# Patient Record
Sex: Male | Born: 1955 | Race: Black or African American | Hispanic: No | Marital: Married | Smoking: Former smoker
Health system: Southern US, Community
[De-identification: ages and names within clinical notes are randomized; demographics above are authoritative.]

## PROBLEM LIST (undated history)

## (undated) DIAGNOSIS — I10 Essential (primary) hypertension: Secondary | ICD-10-CM

## (undated) DIAGNOSIS — I739 Peripheral vascular disease, unspecified: Secondary | ICD-10-CM

## (undated) DIAGNOSIS — I779 Disorder of arteries and arterioles, unspecified: Secondary | ICD-10-CM

## (undated) DIAGNOSIS — E1165 Type 2 diabetes mellitus with hyperglycemia: Secondary | ICD-10-CM

## (undated) DIAGNOSIS — E118 Type 2 diabetes mellitus with unspecified complications: Secondary | ICD-10-CM

## (undated) DIAGNOSIS — I2510 Atherosclerotic heart disease of native coronary artery without angina pectoris: Secondary | ICD-10-CM

## (undated) DIAGNOSIS — I219 Acute myocardial infarction, unspecified: Secondary | ICD-10-CM

## (undated) DIAGNOSIS — I639 Cerebral infarction, unspecified: Secondary | ICD-10-CM

## (undated) DIAGNOSIS — IMO0002 Reserved for concepts with insufficient information to code with codable children: Secondary | ICD-10-CM

## (undated) DIAGNOSIS — E785 Hyperlipidemia, unspecified: Secondary | ICD-10-CM

## (undated) DIAGNOSIS — R001 Bradycardia, unspecified: Secondary | ICD-10-CM

## (undated) HISTORY — PX: SHOULDER SURGERY: SHX246

## (undated) HISTORY — DX: Atherosclerotic heart disease of native coronary artery without angina pectoris: I25.10

## (undated) HISTORY — PX: CHOLECYSTECTOMY: SHX55

## (undated) HISTORY — PX: OTHER SURGICAL HISTORY: SHX169

---

## 2000-06-25 DIAGNOSIS — I219 Acute myocardial infarction, unspecified: Secondary | ICD-10-CM

## 2000-06-25 HISTORY — PX: CORONARY ARTERY BYPASS GRAFT: SHX141

## 2000-06-25 HISTORY — DX: Acute myocardial infarction, unspecified: I21.9

## 2004-01-06 ENCOUNTER — Other Ambulatory Visit: Payer: Self-pay

## 2005-08-19 ENCOUNTER — Inpatient Hospital Stay: Payer: Self-pay | Admitting: Internal Medicine

## 2005-08-19 ENCOUNTER — Other Ambulatory Visit: Payer: Self-pay

## 2005-08-22 ENCOUNTER — Ambulatory Visit: Payer: Self-pay | Admitting: Physical Medicine & Rehabilitation

## 2005-08-22 ENCOUNTER — Inpatient Hospital Stay (HOSPITAL_COMMUNITY)
Admission: RE | Admit: 2005-08-22 | Discharge: 2005-08-29 | Payer: Self-pay | Admitting: Physical Medicine & Rehabilitation

## 2005-09-30 ENCOUNTER — Emergency Department: Payer: Self-pay | Admitting: Emergency Medicine

## 2005-10-05 ENCOUNTER — Encounter
Admission: RE | Admit: 2005-10-05 | Discharge: 2006-01-03 | Payer: Self-pay | Admitting: Physical Medicine & Rehabilitation

## 2005-10-27 ENCOUNTER — Encounter
Admission: RE | Admit: 2005-10-27 | Discharge: 2006-01-25 | Payer: Self-pay | Admitting: Physical Medicine & Rehabilitation

## 2005-11-01 ENCOUNTER — Ambulatory Visit: Payer: Self-pay | Admitting: Physical Medicine & Rehabilitation

## 2007-08-01 ENCOUNTER — Ambulatory Visit: Payer: Self-pay | Admitting: Gastroenterology

## 2007-12-16 ENCOUNTER — Ambulatory Visit: Payer: Self-pay | Admitting: Family Medicine

## 2007-12-31 ENCOUNTER — Emergency Department: Payer: Self-pay | Admitting: Emergency Medicine

## 2009-03-29 ENCOUNTER — Encounter: Payer: Self-pay | Admitting: Internal Medicine

## 2009-04-25 ENCOUNTER — Encounter: Payer: Self-pay | Admitting: Internal Medicine

## 2009-05-25 ENCOUNTER — Encounter: Payer: Self-pay | Admitting: Internal Medicine

## 2010-04-10 ENCOUNTER — Inpatient Hospital Stay: Payer: Self-pay | Admitting: Internal Medicine

## 2010-10-03 ENCOUNTER — Observation Stay (HOSPITAL_COMMUNITY)
Admission: EM | Admit: 2010-10-03 | Discharge: 2010-10-05 | DRG: 282 | Disposition: A | Discharge status: Home / Self Care | Payer: Medicare Other | Attending: Internal Medicine | Admitting: Internal Medicine

## 2010-10-03 ENCOUNTER — Emergency Department (HOSPITAL_COMMUNITY): Payer: Medicare Other

## 2010-10-03 DIAGNOSIS — E119 Type 2 diabetes mellitus without complications: Secondary | ICD-10-CM | POA: Diagnosis present

## 2010-10-03 DIAGNOSIS — R0989 Other specified symptoms and signs involving the circulatory and respiratory systems: Secondary | ICD-10-CM

## 2010-10-03 DIAGNOSIS — I214 Non-ST elevation (NSTEMI) myocardial infarction: Principal | ICD-10-CM | POA: Diagnosis present

## 2010-10-03 DIAGNOSIS — R42 Dizziness and giddiness: Secondary | ICD-10-CM

## 2010-10-03 DIAGNOSIS — R0609 Other forms of dyspnea: Secondary | ICD-10-CM

## 2010-10-03 DIAGNOSIS — Z87891 Personal history of nicotine dependence: Secondary | ICD-10-CM

## 2010-10-03 DIAGNOSIS — I252 Old myocardial infarction: Secondary | ICD-10-CM

## 2010-10-03 DIAGNOSIS — Z8249 Family history of ischemic heart disease and other diseases of the circulatory system: Secondary | ICD-10-CM

## 2010-10-03 DIAGNOSIS — Z79899 Other long term (current) drug therapy: Secondary | ICD-10-CM

## 2010-10-03 DIAGNOSIS — Z7902 Long term (current) use of antithrombotics/antiplatelets: Secondary | ICD-10-CM

## 2010-10-03 DIAGNOSIS — I10 Essential (primary) hypertension: Secondary | ICD-10-CM | POA: Diagnosis present

## 2010-10-03 DIAGNOSIS — Z8673 Personal history of transient ischemic attack (TIA), and cerebral infarction without residual deficits: Secondary | ICD-10-CM

## 2010-10-03 DIAGNOSIS — I2510 Atherosclerotic heart disease of native coronary artery without angina pectoris: Secondary | ICD-10-CM | POA: Diagnosis present

## 2010-10-03 DIAGNOSIS — E785 Hyperlipidemia, unspecified: Secondary | ICD-10-CM | POA: Diagnosis present

## 2010-10-03 DIAGNOSIS — Z794 Long term (current) use of insulin: Secondary | ICD-10-CM

## 2010-10-03 DIAGNOSIS — Z7982 Long term (current) use of aspirin: Secondary | ICD-10-CM

## 2010-10-03 DIAGNOSIS — Z951 Presence of aortocoronary bypass graft: Secondary | ICD-10-CM

## 2010-10-03 LAB — BASIC METABOLIC PANEL
BUN: 17 mg/dL (ref 6–23)
CO2: 27 mEq/L (ref 19–32)
Chloride: 102 mEq/L (ref 96–112)
Creatinine, Ser: 1.09 mg/dL (ref 0.4–1.5)
Glucose, Bld: 143 mg/dL — ABNORMAL HIGH (ref 70–99)
Potassium: 4 mEq/L (ref 3.5–5.1)

## 2010-10-03 LAB — URINALYSIS, ROUTINE W REFLEX MICROSCOPIC
Bilirubin Urine: NEGATIVE
Hgb urine dipstick: NEGATIVE
Ketones, ur: NEGATIVE mg/dL
Nitrite: NEGATIVE
Specific Gravity, Urine: 1.016 (ref 1.005–1.030)
Urobilinogen, UA: 1 mg/dL (ref 0.0–1.0)

## 2010-10-03 LAB — COMPREHENSIVE METABOLIC PANEL
BUN: 16 mg/dL (ref 6–23)
CO2: 28 mEq/L (ref 19–32)
Calcium: 9.3 mg/dL (ref 8.4–10.5)
Chloride: 104 mEq/L (ref 96–112)
Creatinine, Ser: 1.03 mg/dL (ref 0.4–1.5)
GFR calc non Af Amer: >60 mL/min (ref >60)
Glucose, Bld: 121 mg/dL — ABNORMAL HIGH (ref 70–99)
Total Bilirubin: 0.7 mg/dL (ref 0.3–1.2)

## 2010-10-03 LAB — HEPATIC FUNCTION PANEL
ALT: 16 U/L (ref 0–53)
AST: 19 U/L (ref 0–37)
Albumin: 3.8 g/dL (ref 3.5–5.2)
Alkaline Phosphatase: 57 U/L (ref 39–117)
Total Protein: 7.5 g/dL (ref 6.0–8.3)

## 2010-10-03 LAB — CBC
MCH: 28.2 pg (ref 26.0–34.0)
Platelets: 238 10*3/uL (ref 150–400)
RBC: 4.79 MIL/uL (ref 4.22–5.81)
RDW: 13.1 % (ref 11.5–15.5)
WBC: 4.5 10*3/uL (ref 4.0–10.5)

## 2010-10-03 LAB — DIFFERENTIAL
Basophils Relative: 1 % (ref 0–1)
Eosinophils Absolute: 0.2 10*3/uL (ref 0.0–0.7)
Eosinophils Relative: 4 % (ref 0–5)
Monocytes Relative: 7 % (ref 3–12)
Neutrophils Relative %: 46 % (ref 43–77)

## 2010-10-03 LAB — POCT I-STAT, CHEM 8
Calcium, Ion: 1.18 mmol/L (ref 1.12–1.32)
Glucose, Bld: 146 mg/dL — ABNORMAL HIGH (ref 70–99)
HCT: 43 % (ref 39.0–52.0)
TCO2: 28 mmol/L (ref 0–100)

## 2010-10-03 LAB — HEMOGLOBIN A1C
Hgb A1c MFr Bld: 7.9 % — ABNORMAL HIGH (ref <5.7)
Mean Plasma Glucose: 180 mg/dL — ABNORMAL HIGH (ref <117)

## 2010-10-03 LAB — CK TOTAL AND CKMB (NOT AT ARMC)
Relative Index: 18.2 — ABNORMAL HIGH (ref 0.0–2.5)
Total CK: 102 U/L (ref 7–232)

## 2010-10-03 LAB — PROTIME-INR: Prothrombin Time: 12.6 seconds (ref 11.6–15.2)

## 2010-10-03 LAB — POCT CARDIAC MARKERS

## 2010-10-03 LAB — TROPONIN I: Troponin I: 0.49 ng/mL — ABNORMAL HIGH (ref 0.00–0.06)

## 2010-10-04 DIAGNOSIS — I059 Rheumatic mitral valve disease, unspecified: Secondary | ICD-10-CM

## 2010-10-04 DIAGNOSIS — I2510 Atherosclerotic heart disease of native coronary artery without angina pectoris: Secondary | ICD-10-CM

## 2010-10-04 DIAGNOSIS — R55 Syncope and collapse: Secondary | ICD-10-CM

## 2010-10-04 LAB — LIPID PANEL
Cholesterol: 239 mg/dL — ABNORMAL HIGH (ref 0–200)
LDL Cholesterol: 189 mg/dL — ABNORMAL HIGH (ref 0–99)
Total CHOL/HDL Ratio: 6.6 RATIO
Triglycerides: 68 mg/dL (ref <150)
VLDL: 14 mg/dL (ref 0–40)

## 2010-10-04 LAB — CARDIAC PANEL(CRET KIN+CKTOT+MB+TROPI)
CK, MB: 0.9 ng/mL (ref 0.3–4.0)
CK, MB: 0.9 ng/mL (ref 0.3–4.0)
CK, MB: 0.9 ng/mL (ref 0.3–4.0)
Relative Index: INVALID (ref 0.0–2.5)
Total CK: 85 U/L (ref 7–232)
Total CK: 83 U/L (ref 7–232)
Troponin I: <0.01 ng/mL (ref 0.00–0.06)

## 2010-10-04 LAB — BASIC METABOLIC PANEL
BUN: 15 mg/dL (ref 6–23)
Chloride: 106 mEq/L (ref 96–112)
Creatinine, Ser: 0.89 mg/dL (ref 0.4–1.5)
Glucose, Bld: 123 mg/dL — ABNORMAL HIGH (ref 70–99)
Potassium: 3.6 mEq/L (ref 3.5–5.1)

## 2010-10-04 LAB — GLUCOSE, CAPILLARY
Glucose-Capillary: 125 mg/dL — ABNORMAL HIGH (ref 70–99)
Glucose-Capillary: 123 mg/dL — ABNORMAL HIGH (ref 70–99)
Glucose-Capillary: 122 mg/dL — ABNORMAL HIGH (ref 70–99)

## 2010-10-05 LAB — BASIC METABOLIC PANEL
Chloride: 102 mEq/L (ref 96–112)
Creatinine, Ser: 0.99 mg/dL (ref 0.4–1.5)
GFR calc Af Amer: >60 mL/min (ref >60)
Sodium: 137 mEq/L (ref 135–145)

## 2010-10-05 LAB — CBC
Hemoglobin: 13.6 g/dL (ref 13.0–17.0)
MCHC: 33.9 g/dL (ref 30.0–36.0)
RBC: 4.85 MIL/uL (ref 4.22–5.81)
WBC: 5.3 10*3/uL (ref 4.0–10.5)

## 2010-10-05 LAB — GLUCOSE, CAPILLARY
Glucose-Capillary: 126 mg/dL — ABNORMAL HIGH (ref 70–99)
Glucose-Capillary: 104 mg/dL — ABNORMAL HIGH (ref 70–99)

## 2010-10-09 NOTE — Discharge Summary (Signed)
NAME:  Shawn Hood, Shawn Hood                 ACCOUNT NO.:  [id number]  MEDICAL RECORD NO.:  [id number]           PATIENT TYPE:  O  LOCATION:  3712                         FACILITY:  [facility]  PHYSICIAN:  Shawn Hood, M.D.DATE OF BIRTH:  [birth year]  DATE OF ADMISSION:  10/03/2010 DATE OF DISCHARGE:  10/05/2010                              DISCHARGE SUMMARY   DISCHARGE DIAGNOSES: 1. Non-ST elevation myocardial infarction - left heart cath      indicating no specific culprit for non-ST elevation myocardial     infarction, however, severe two-vessel coronary artery disease with     patent grafts.  Chronically occluded OM2/diagonals via by     collaterals. 2. Diabetes mellitus type 2 - hemoglobin A1c 7.4.  Reported episodes     of hypoglycemia at home, without notable hypoglycemia during     hospital course. 3. Hypertension. 4. Hyperlipidemia - newly started on statin therapy. 5. History of cerebrovascular accident x2 - with MRI brain indicating     no acute infarction. 6. History of coronary artery disease status post three-vessel CABG in     2002 performed at [facility] - cardiac cath results as above.  The     patient resumed on aspirin and Plavix therapy.  Started on statin. 7. Left arm paresthesia - resolved, likely in setting of #1.  DISCHARGE MEDICATIONS: 1. Lantus 20 units subcutaneously daily at bedtime. 2. Metoprolol 12.5 mg by mouth twice daily. 3. Crestor 5 mg tablet daily at bedtime. 4. NovoLog insulin inject 2 units subcutaneously three times daily, 15 minutes before each meal. 5. Quinapril 10 mg tablets by mouth daily. 6. Actonel 30 mg tablets by mouth daily. 7. Aspirin 325 mg tablets by mouth daily. 8. Metformin 500 mg 2 tablets by mouth twice daily - will be held for 1 additional day, then resumed (in setting of recent contrast study) 9. Plavix 75 mg tablets by mouth daily.  DISPOSITION AND FOLLOWUP:   The patient is to follow up with his primary care provider, Dr. Fulton Mole as per [facility] on October 09, 2010, at 1:20 p.m. at which time a CBG should be ordered to assess the patient's blood sugar control, as insulin therapy was  de-escalated during hospital course and oral hypoglycemics were also escalated secondary to multiple reported episodes of hypoglycemia at home.  On the de-escalated regimen, the patient's blood sugar was very well controlled during the hospital course generally in the 120s.  We request Dr. Lawerance Bach also evaluate the patient's blood pressure control as he was newly started on beta-blocker and then will be discharged home on a reduced dose of his ACE inhibitor to hopefully avoid episodes of hypotension after discharge.  Liver function tests should be evaluated in 4 to 6 weeks as the patient has been newly started on statin therapy during the hospital course.  The patient should also be reevaluated for any further indications of neurologic deficit versus cardiac chest pain.   The patient is also to follow up with [facility] or other preferred cardiologist within 1 month time for followup of his coronary artery disease and  management of his significant risk factors.  The patient is recommended to follow up carbohydrate-modified diabetic diet and maintain a low sodium heart-healthy diet.    The patient is instructed that since he is status post catheterization, he is not to for take any heavy lifting for 1 week and then he can escalate his activity as tolerated. He is also to proceed with post-cath cleaning and care instructions as provided by nursing, prior to discharge.  PROCEDURES PERFORMED: 1. Chest x-ray - October 03, 2010 - no acute findings. 2. MRA, head without contrast - October 04, 2010 - mild intracranial     atherosclerotic disease without large vessel occlusion. 3. MRI brain without contrast - October 04, 2010 - mild chronic infarcts     in the pons.  No acute infarct. 4. 2-D echocardiogram - October 04, 2010 - mild left  ventricular     hypertrophy indicated by mildly increased left ventricular wall     thickness.  Normal systolic function with ejection fraction of 55     to 60%.  Mild mitral valve regurgitation. 5. Carotid Doppler - October 04, 2010 - no significant extracranial     carotid artery stenosis demonstrated.  Mild heterogeneous plaque     noted throughout bilaterally.  Vertebrals are patent with antegrade     flow.  CONSULTATION:  [facility].  BRIEF ADMITTING HISTORY AND PHYSICAL:  The patient is a 55 year old male with past medical history of multiple prior CVAs with the last one in 2007, diabetes mellitus type 2, hypertension, coronary artery disease status post CABG in 2002 at [facility], who presents to [facility] for evaluation of single episode of lightheadedness with associated left arm tingling on the morning of admission.  The patient notes that he was sitting on his couch holding his granddaughter and suddenly became lightheaded, after which time he went to place granddaughter down and noted left upper arm numbness, but was difficult to place her down safely.  No associated chest pain, shortness of breath, diaphoresis, tachycardia, or palpitations.  Symptoms lasted for approximately 1 hour, after which time they spontaneously resolved.  Of note, the patient does have residual mild upper extremity and lower extremity weakness following his last CVA in 2007.  He has not had recent changes in medications, states he is taking his aspirin and Plavix 4 to 5 times 7 days in each week.  Confirmed decreased oral intake during the day of admission.  Did not had blood sugars during acute episode, however, he does confirm low blood sugar of 52 on the evening prior to admission. Otherwise, the patient denied any associated slurred speech, vision changes, headaches, fevers, chills, nausea, vomiting, diarrhea, or abdominal pain.  No recent illnesses or sick contact.  No recent increased  periods of inactivity.  PHYSICAL EXAM:  On admission, VITAL SIGNS:  admission, temperature 97.9, blood pressure 140/80, heart rate 70, respirations 21, O2 saturation 100% on room air. GENERAL:  Well-developed and well-nourished, in no acute distress, alert, appropriate, and cooperative throughout exam. HEAD:  Normocephalic, atraumatic. NECK:  No deformities, masses, or tenderness noted. LUNGS:  Clear to auscultation bilaterally. HEART:  Regular rate and rhythm without murmurs, rubs, or gallops. ABDOMEN:  Bowel sounds normoactive, soft, nondistended, nontender. EXTREMITIES:  No pretibial edema. NEUROLOGIC:  Alert and oriented x3, cranial nerves II through XII grossly intact without focalization, 4/5 muscle strength in left upper and lower extremities as compared to right.  Decreased tactile sensation though in upper left arm as compared to  the right.  No dysmetria appreciated.  LABORATORY DATA:  Labs on admission, CBC:  WBC 4.5, hemoglobin 13.5, hematocrit 40, platelets count 238. BMET:  Sodium 135, potassium 4.0, chloride 102, bicarb 27, glucose 143, BUN 17, creatinine 1.09. Hepatic function panel:  Within normal limits. Cardiac enzymes:  CK-MB 1, troponin less than 0.05, myoglobin 69.6. Coagulation studies:  Within normal limits.  HOSPITAL COURSE BY PROBLEM: 1. Non-ST-elevation MI - on admission, the patient's initial cardiac     enzymes were negative x1.  However, second set of cardiac enzymes     did show a bump in troponin from less than 0.05 to 0.4.     Additionally, there was anterolateral T-wave inversion appreciated     on EKG, although it was unclear if this was new changes.  No prior     EKGs were available from which to compare.  In the setting of T-     wave inversion in addition to increase in troponins, Cardiology was     consulted who had initiated heparin drip and also performed cardiac     cath on October 04, 2010 showing severe two-vessel coronary artery      disease with patent grafts.  Chronically occluded OM2 - diagonal     via by collaterals.  No culprit for NSTEMI identified.  Cardiology     recommended to continue aggressive medical therapy.  During this     cardiac evaluation, fasting lipid panel was also checked, which     revealed suboptimal control of cholesterol, with LDL of 189.     Therefore, after review of patient's outpatient clinic records,     which did not indicate prior intolerances or allergies to statin     therapy, Crestor was initiated during hospital course and the     patient will continue with statin and aspirin therapy at the time     of discharge.  We also worked towards aggressive medical therapy and     risk factor modification.  The patient is recommended follow up     with Cardiology within 1 month of discharge from the hospital. 2. Lightheadedness and left arm tingling - likely in setting of non-ST-     elevation MI.  MRI and MRA were negative for acute infarction. 3. Diabetes mellitus type 2 - hemoglobin A1c of 7.9.  The patient was     de-escalated of his insulin during the hospital course secondary to     reported multiple episodes of hypoglycemia at home.  The patient     was resumed one half of his Lantus dosage and initially held of his     metformin in the setting of multiple required contrasted imaging     studies.  At the time of his discharge, the patient will be held of     his home sulfonylurea and continued on his decreased insulin     regimen with very close followup with his primary care physician to     determine if further escalation as indicated. He will also Metformin for 1 additional     day, then resume, in setting of recent contrast study. 4. Hypertension - the patient had good blood pressure control during     the hospital course with the range between 120 to 130.  He was     started on beta-blocker therapy in the setting of his known     coronary artery disease and diabetes.  He will be  continued on this  medication on discharge and decreased of his home Accupril dosage     and will be counseled on symptomatology indicative of hypotension     such that on hospital followup he can report any hypotensive     episodes if experienced on this dual therapy. 5. History of multiple CVAs - the patient continued on his home     aspirin and Plavix during the hospital course.  MRI had indicated     no new infarction appreciated.  The patient was started on statin     therapy in the setting of non-ST-elevation MI, which he will     continue at the time of discharge.  The patient will require     monitoring of his liver function tests in the setting of recently     started statin therapy. 6. History of coronary artery disease status post CABG in 2002 at [facility]     - please see management as per non-ST-elevation MI.  DISCHARGE LABS:  Sodium 137, potassium 4.1, chloride 102, bicarb 30, glucose 124, BUN 14, creatinine 0.99, calcium 9.5. CBC:  WBC 5.3, hemoglobin 13.6, hematocrit 40.1, platelets 246.  VITAL SIGNS:  On discharge, temperature 98.1, pulse 59, respirations 22, blood pressure 121/72, O2 saturation 99% on room air.    ______________________________ Shawn Abraham, DO   ______________________________ Shawn Hood, M.D.    SK/MEDQ  D:  10/05/2010  T:  10/05/2010  Job:  [id number]  cc:   Dr.  Fulton Mole [facility], Dr. Patty Sermons  Electronically Signed by Shawn Abraham DO on 10/07/2010 10:10:51 AM Electronically Signed by Eliezer Lofts M.D. on 10/09/2010 08:39:41 AM

## 2010-10-10 NOTE — Consult Note (Signed)
NAME:  Shawn Hood, Shawn Hood NO.:  [id number]  MEDICAL RECORD NO.:  [id number]           PATIENT TYPE:  E  LOCATION:  [facility]                         FACILITY:  [facility]  PHYSICIAN:  Cassell Clement, M.D. DATE OF BIRTH:  12-02-1955  DATE OF CONSULTATION:  10/03/2010 DATE OF DISCHARGE:                                CONSULTATION   PRIMARY CARDIOLOGIST:  Dr. Gwen Pounds in [place].  PRIMARY CARE PROVIDER:  [facility] in [place].  PATIENT PROFILE:  A 55 year old male with prior history of coronary artery disease as well as CABG in 2002 and stroke x2, presents with lightheadedness and dyspnea, found to have elevated CK-MB and troponin.  PROBLEMS: 1. Non-ST elevation MI/CAD.     a.     Status post CABG in 2002, performed at [facility].  Negative      Myoview at [facility], October 2011. 2. History of CVA.     a.     1994.     b.     Right pons infarct in 2007. 3. History of TIAs. 4. Hypertension. 5. Hyperlipidemia. 6. Poorly controlled diabetes mellitus. 7. Remote tobacco abuse, quitting about 25 years ago. 8. Status post left shoulder surgery. 9. Status post cholecystectomy.  ALLERGIES:  No known drug allergies.  HISTORY OF PRESENT ILLNESS:  A 55 year old male with prior history of cerebrovascular and cardiovascular disease, status post stroke in 1994. The patient subsequently underwent coronary artery bypass grafting in 2002 at [facility] and has been followed by Dr. Gwen Pounds.  Recurrent CVA in 2007.  Since 2007, he has been doing reasonably well, although, he does have mild upper and lower extremity weakness related to his prior strokes.  He is active at home and has not had any issues from a cardiac standpoint.  He has had no recent chest pain and last had a negative Myoview in October 2011.  This past Monday, he was able to push mow his entire yard without any problems.  Earlier today, the patient was at home, sitting on his couch and holding his  granddaughter who is 41-year-old, and he had sudden onset of a numb sensation in the back of his head associated with profound lightheadedness and presyncope.  The patient had no chest pain, but did note dyspnea.  The patient got up so that he would be able to put down his granddaughter because he was fearful that he may drop her and he felt more lightheaded when standing.  He safely placed his granddaughter down and then sat back down himself and continued to feel lightheaded. He called his wife and together they called 911.  The patient was taken to the [facility] ED where ECG shows anterolateral T-wave inversion.  No old EKGs available for comparison.  His first set of point-of-care markers at 1524 were negative, but followup laboratory set showed an MB of 18.6 with troponin-I of 0.49 and a relative index of 18.2.  The patient is being admitted by the teaching service and up to this point has been worked up for possible TIA/stroke.Marland Kitchen  He has undergone MRI/MRA of the brain and those results are  currently pending.  At this point, he has no complaints, and no focal deficits.  We have been asked to evaluate secondary to acute coronary syndrome.  HOME MEDICATIONS: 1. Metformin 1000 mg b.i.d. 2. Actos 30 mg daily. 3. Aspirin 325 mg daily. 4. Glipizide 10 mg daily. 5. Lantus 40 units nightly. 6. NovoLog 5 units t.i.d. before meals. 7. Plavix 75 mg daily. 8. Quinapril 20 mg daily.  FAMILY HISTORY:  Mother died at 27 of MI.  Father is alive and well at 84.  He has two sisters, they are alive and well.  Three brothers, two of which have had coronary artery disease and CABG.  One brother died at 42 of an MI.  SOCIAL HISTORY:  The patient lives in [place] with his wife.  He is disabled.  He has four children, two of which continued to live at home. Previous history of tobacco abuse, quitting 25 years ago.  Denies alcohol or drug.  His child remained active around the house  without limitations.  REVIEW OF SYSTEMS:  Positive for dyspnea with presyncope, weakness, history of diabetes.  He is a full code.  Otherwise, all systems reviewed and negative.  PHYSICAL EXAMINATION:  VITAL SIGNS:  Temperature 97.9, heart rate 70, respirations 15, blood pressure 134/69, pulse ox 81% on room air. GENERAL:  Pleasant, African American male, in no acute distress.  Awake and alert x3 with a normal affect. HEENT:  Ears normal.  Nares grossly intact, nonfocal. SKIN:  Warm and dry without lesions or masses. NECK:  Supple without bruits or JVD. LUNGS:  Respirations are regular and unlabored.  Clear to auscultation. CARDIAC:  Regular.  S1 and S2.  No S3, S4, or murmurs. ABDOMEN:  Round, soft, nontender, and nondistended.  Bowel sounds present x4. EXTREMITIES:  Warm and dry.  No clubbing, cyanosis, or edema.  Dorsalis pedis, posterior tibial pulses, and femoral pulses are 2+.  No femoral bruits are noted.  Radial pulses are 2+ and equal bilaterally.  Chest x-ray shows no acute findings.  MRI/MRA of the brain is pending at this time.  Hemoglobin 13.5, hematocrit 40.0, WBC 4.5, platelets 238. Sodium 135, potassium 4.0, chloride 102, CO2 27, BUN 17, creatinine 1.09, glucose 143, total bilirubin 0.7, alkaline phosphatase 57, AST 19, ALT 16, total protein 7.5, albumin 3.8.  CK 102, MB 18.6, troponin I 0.49, index 18.2, INR 0.92.  Urinalysis negative.  ASSESSMENT: 1. Non-ST elevation myocardial infarction.  The patient denies chest     pain, which was prior anginal equivalent, though he did have     dyspnea in the setting of lightheadedness and presyncope.  All     symptoms have now resolved.  Agree with admission and continue to     cycle enzymes.  Provided MRI/MRA is within normal limits.  Would     add heparin.  We will admit the patient to the CCU.  Continue     aspirin, Plavix, and ACE inhibitor.  Add statin and beta-blocker.     Probably cath in the a.m.  Pending MRA and  also MRI result. 2. Cardiovascular disease/history of cerebrovascular accident.     MRI/MRA pending.  Symptoms resolved.  No focal deficits present. 3. Hypertensive, stable.  Follow on ACE and beta-blocker. 4. Hyperlipidemia, agree with statin. 5. Diabetes mellitus, hold metformin in preparation for probable cath     in the a.m.     Nicolasa Ducking, ANP   ______________________________ Cassell Clement, M.D.    CB/MEDQ  D:  10/03/2010  T:  10/04/2010  Job:  [id number]  Electronically Signed by Nicolasa Ducking ANP on 10/09/2010 03:05:00 PM Electronically Signed by Cassell Clement M.D. on 10/10/2010 02:25:41 PM

## 2010-10-25 NOTE — Cardiovascular Report (Signed)
NAME:  Shawn Hood, Shawn Hood                 ACCOUNT NO.:  [id number]  MEDICAL RECORD NO.:  [id number]           PATIENT TYPE:  O  LOCATION:  3712                         FACILITY:  [facility]  PHYSICIAN:  Lorine Bears, MD     DATE OF BIRTH:  Sep 18, 1955  DATE OF PROCEDURE: DATE OF DISCHARGE:                           CARDIAC CATHETERIZATION   PRIMARY CARDIOLOGIST:  Lamar Blinks, MD in [place].  REFERRING CARDIOLOGIST:  Cassell Clement, MD  PROCEDURES PERFORMED: 1. Left heart catheterization. 2. Coronary angiography. 3. Left ventricular angiography. 4. Left internal mammary artery angiography. 5. Saphenous vein graft angiography.  INDICATIONS AND CLINICAL HISTORY:  Shawn Hood is a 55 year old gentleman with known history of coronary artery disease status post coronary artery bypass graft surgery in 2002.  He presented with symptoms of presyncope as well as discomfort in the back of his head.  He was found to have a small non-ST elevation myocardial infarction with mildly elevated cardiac enzymes.  His symptoms were overall atypical.  He did have a brain MRI, which showed no acute process.  Due to his symptoms and mildly elevated cardiac enzymes, cardiac catheterization was recommended.  Risks, benefits, and alternatives were discussed with the patient.  Access is right femoral artery.  STUDY DETAILS:  A standard informed consent was obtained.  The right groin area was prepped in a sterile fashion.  It was anesthetized with 1% lidocaine.  A 5-French sheath was placed in the right femoral artery after an anterior puncture.  Coronary angiography was performed with a JL-4, JR-4, LIMA catheter as well as an angled pigtail catheter.  A long exchange wire was used to engage the left subclavian and exchanged to a LIMA catheter.  The patient tolerated the procedure well with no immediate complications.  STUDY FINDINGS:  Hemodynamic findings:  Central aortic pressure was 127/69 with a  mean pressure of 93 mmHg. Left ventricular pressure was 157/9 with a left ventricular end- diastolic pressure of 16 mmHg.  Left ventricular angiography:  This showed normal LV systolic function with an estimated ejection fraction of 60% with no significant mitral regurgitation.  CORONARY ANGIOGRAPHY:  Left main coronary artery:  The vessel was normal in size and free of significant disease.  It has mild atherosclerosis. Left anterior descending artery:  The vessel was normal in size and moderately calcified in the proximal and mid segment.  It is occluded in the mid segment.  It does give two medium size diagonal branches, which are subtotally occluded, and they appeared to be getting collaterals from the right coronary artery. Left circumflex artery:  The vessel was occluded in the proximal segment. Right coronary artery:  The vessel was normal in size.  There is a 40% discrete lesion in the proximal segment as well as tubular 30% disease in the mid segment.  The right coronary artery is giving collaterals to the proximal left circumflex distribution likely OM-2 and OM-1. SVG to OM-3:  The graft was patent with a mild 20% proximal disease. There is no other obstructive disease.  There does seem to be collaterals supplying the other OM branches.  It seems  that the proximal OM branches were not bypassed. SVG to right PDA:  Graft is patent with 30% proximal disease.  There is no other obstructive disease.  There are extensive collaterals going to what seems to be diagonal distribution. LIMA artery:  The vessel was patent with no significant disease in the graft.  However, in the LAD, there is diffuse 80% disease distally close to the apex.  This is not approachable for PCI due to distal location, and it does appear to be chronic.  STUDY CONCLUSIONS: 1. Severe two-vessel coronary artery disease with patent grafts. 2. Chronically occluded OM-2 and possibly OM-1 as well as diagonal      branches, which seems to be supplied by good collaterals. 3. Distal LAD disease close to the apex, which appears to be chronic     and not approachable for percutaneous angioplasty. 4. No culprit was identified for a small non-ST elevation myocardial     infarction. 5. Normal LV systolic function with only mildly elevated left     ventricular end-diastolic pressure.  RECOMMENDATIONS:  I recommend continuing aggressive medical therapy.  No revascularization is advised.     Lorine Bears, MD     MA/MEDQ  D:  10/04/2010  T:  10/05/2010  Job:  [id number]  cc:   Cassell Clement, M.D. Lamar Blinks, MD  Electronically Signed by Lorine Bears MD on 10/25/2010 02:56:01 PM

## 2011-06-26 HISTORY — PX: CORONARY ANGIOPLASTY WITH STENT PLACEMENT: SHX49

## 2011-10-02 ENCOUNTER — Emergency Department (HOSPITAL_COMMUNITY): Payer: Medicare Other

## 2011-10-02 ENCOUNTER — Observation Stay (HOSPITAL_COMMUNITY)
Admission: EM | Admit: 2011-10-02 | Discharge: 2011-10-04 | Disposition: A | Discharge status: Home / Self Care | Payer: Medicare Other | Attending: Internal Medicine | Admitting: Internal Medicine

## 2011-10-02 ENCOUNTER — Encounter (HOSPITAL_COMMUNITY): Payer: Self-pay | Admitting: Emergency Medicine

## 2011-10-02 DIAGNOSIS — I2510 Atherosclerotic heart disease of native coronary artery without angina pectoris: Secondary | ICD-10-CM | POA: Insufficient documentation

## 2011-10-02 DIAGNOSIS — Z8673 Personal history of transient ischemic attack (TIA), and cerebral infarction without residual deficits: Secondary | ICD-10-CM | POA: Insufficient documentation

## 2011-10-02 DIAGNOSIS — R209 Unspecified disturbances of skin sensation: Secondary | ICD-10-CM | POA: Insufficient documentation

## 2011-10-02 DIAGNOSIS — E119 Type 2 diabetes mellitus without complications: Secondary | ICD-10-CM | POA: Insufficient documentation

## 2011-10-02 DIAGNOSIS — R9431 Abnormal electrocardiogram [ECG] [EKG]: Secondary | ICD-10-CM

## 2011-10-02 DIAGNOSIS — E785 Hyperlipidemia, unspecified: Secondary | ICD-10-CM | POA: Insufficient documentation

## 2011-10-02 DIAGNOSIS — R42 Dizziness and giddiness: Secondary | ICD-10-CM | POA: Insufficient documentation

## 2011-10-02 DIAGNOSIS — Z794 Long term (current) use of insulin: Secondary | ICD-10-CM | POA: Insufficient documentation

## 2011-10-02 DIAGNOSIS — R079 Chest pain, unspecified: Principal | ICD-10-CM | POA: Insufficient documentation

## 2011-10-02 DIAGNOSIS — R0602 Shortness of breath: Secondary | ICD-10-CM | POA: Insufficient documentation

## 2011-10-02 DIAGNOSIS — E871 Hypo-osmolality and hyponatremia: Secondary | ICD-10-CM | POA: Insufficient documentation

## 2011-10-02 DIAGNOSIS — I10 Essential (primary) hypertension: Secondary | ICD-10-CM | POA: Insufficient documentation

## 2011-10-02 HISTORY — DX: Cerebral infarction, unspecified: I63.9

## 2011-10-02 HISTORY — DX: Atherosclerotic heart disease of native coronary artery without angina pectoris: I25.10

## 2011-10-02 HISTORY — DX: Essential (primary) hypertension: I10

## 2011-10-02 HISTORY — DX: Hyperlipidemia, unspecified: E78.5

## 2011-10-02 LAB — CBC
HCT: 40.3 % (ref 39.0–52.0)
Platelets: 244 10*3/uL (ref 150–400)
RDW: 12.3 % (ref 11.5–15.5)
WBC: 5.4 10*3/uL (ref 4.0–10.5)

## 2011-10-02 LAB — DIFFERENTIAL
Basophils Absolute: 0 10*3/uL (ref 0.0–0.1)
Lymphocytes Relative: 50 % — ABNORMAL HIGH (ref 12–46)
Monocytes Absolute: 0.3 10*3/uL (ref 0.1–1.0)
Neutro Abs: 2.3 10*3/uL (ref 1.7–7.7)

## 2011-10-02 LAB — POCT I-STAT TROPONIN I: Troponin i, poc: 0 ng/mL (ref 0.00–0.08)

## 2011-10-02 NOTE — ED Notes (Signed)
PT. REPORTS MID CHEST TIGHTNESS / HEAVINESS WITH LIGHTHEADED AND NUMBNESS AT LEFT ARM  ONSET TODAY. DENIES CHEST PAIN AT TRIAGE.

## 2011-10-03 ENCOUNTER — Encounter (HOSPITAL_COMMUNITY): Payer: Self-pay | Admitting: Internal Medicine

## 2011-10-03 DIAGNOSIS — E119 Type 2 diabetes mellitus without complications: Secondary | ICD-10-CM | POA: Diagnosis present

## 2011-10-03 DIAGNOSIS — I059 Rheumatic mitral valve disease, unspecified: Secondary | ICD-10-CM

## 2011-10-03 DIAGNOSIS — I10 Essential (primary) hypertension: Secondary | ICD-10-CM | POA: Diagnosis present

## 2011-10-03 DIAGNOSIS — R079 Chest pain, unspecified: Principal | ICD-10-CM

## 2011-10-03 DIAGNOSIS — E871 Hypo-osmolality and hyponatremia: Secondary | ICD-10-CM | POA: Diagnosis present

## 2011-10-03 LAB — CARDIAC PANEL(CRET KIN+CKTOT+MB+TROPI)
CK, MB: 1.4 ng/mL (ref 0.3–4.0)
Relative Index: INVALID (ref 0.0–2.5)
Total CK: 74 U/L (ref 7–232)
Total CK: 73 U/L (ref 7–232)
Troponin I: <0.3 ng/mL (ref <0.30)

## 2011-10-03 LAB — GLUCOSE, CAPILLARY
Glucose-Capillary: 268 mg/dL — ABNORMAL HIGH (ref 70–99)
Glucose-Capillary: 328 mg/dL — ABNORMAL HIGH (ref 70–99)
Glucose-Capillary: 110 mg/dL — ABNORMAL HIGH (ref 70–99)
Glucose-Capillary: 167 mg/dL — ABNORMAL HIGH (ref 70–99)

## 2011-10-03 LAB — CBC
MCH: 27.9 pg (ref 26.0–34.0)
Platelets: 224 10*3/uL (ref 150–400)
RBC: 5.19 MIL/uL (ref 4.22–5.81)
RDW: 12.4 % (ref 11.5–15.5)

## 2011-10-03 LAB — BASIC METABOLIC PANEL
CO2: 24 mEq/L (ref 19–32)
Chloride: 95 mEq/L — ABNORMAL LOW (ref 96–112)
Sodium: 132 mEq/L — ABNORMAL LOW (ref 135–145)

## 2011-10-03 LAB — TSH: TSH: 2.33 u[IU]/mL (ref 0.350–4.500)

## 2011-10-03 LAB — CREATININE, SERUM: Creatinine, Ser: 0.86 mg/dL (ref 0.50–1.35)

## 2011-10-03 MED ORDER — ATORVASTATIN CALCIUM 10 MG PO TABS
10.0000 mg | ORAL_TABLET | Freq: Every day | ORAL | Status: DC
  Administered 2011-10-03: 10 mg via ORAL
  Filled 2011-10-03 (×2): qty 1

## 2011-10-03 MED ORDER — INSULIN GLARGINE 100 UNIT/ML ~~LOC~~ SOLN: 40.0000 [IU] | Freq: Two times a day (BID) | SUBCUTANEOUS | Status: DC

## 2011-10-03 MED ORDER — ONDANSETRON HCL 4 MG PO TABS: 4.0000 mg | ORAL_TABLET | Freq: Four times a day (QID) | ORAL | Status: DC | PRN

## 2011-10-03 MED ORDER — INSULIN GLARGINE 100 UNIT/ML ~~LOC~~ SOLN
50.0000 [IU] | Freq: Two times a day (BID) | SUBCUTANEOUS | Status: DC
  Administered 2011-10-03: 11:00:00 via SUBCUTANEOUS
  Administered 2011-10-03 – 2011-10-04 (×2): 50 [IU] via SUBCUTANEOUS

## 2011-10-03 MED ORDER — ONDANSETRON HCL 4 MG/2ML IJ SOLN: 4.0000 mg | Freq: Four times a day (QID) | INTRAMUSCULAR | Status: DC | PRN

## 2011-10-03 MED ORDER — NITROGLYCERIN 0.2 MG/HR TD PT24
0.2000 mg | MEDICATED_PATCH | Freq: Every day | TRANSDERMAL | Status: DC
  Filled 2011-10-03: qty 1

## 2011-10-03 MED ORDER — CLOPIDOGREL BISULFATE 75 MG PO TABS
75.0000 mg | ORAL_TABLET | Freq: Every day | ORAL | Status: DC
  Administered 2011-10-03 – 2011-10-04 (×2): 75 mg via ORAL
  Filled 2011-10-03 (×2): qty 1

## 2011-10-03 MED ORDER — SODIUM CHLORIDE 0.45 % IV SOLN
INTRAVENOUS | Status: DC
  Administered 2011-10-03 – 2011-10-04 (×2): via INTRAVENOUS

## 2011-10-03 MED ORDER — INSULIN ASPART 100 UNIT/ML ~~LOC~~ SOLN
25.0000 [IU] | Freq: Three times a day (TID) | SUBCUTANEOUS | Status: DC
  Administered 2011-10-03: 25 [IU] via SUBCUTANEOUS

## 2011-10-03 MED ORDER — ASPIRIN EC 325 MG PO TBEC
325.0000 mg | DELAYED_RELEASE_TABLET | Freq: Every day | ORAL | Status: DC
  Administered 2011-10-03 – 2011-10-04 (×2): 325 mg via ORAL
  Filled 2011-10-03 (×2): qty 1

## 2011-10-03 MED ORDER — QUINAPRIL HCL 10 MG PO TABS
20.0000 mg | ORAL_TABLET | Freq: Every day | ORAL | Status: DC
  Filled 2011-10-03: qty 2

## 2011-10-03 MED ORDER — ISOSORBIDE MONONITRATE ER 30 MG PO TB24
30.0000 mg | ORAL_TABLET | Freq: Every day | ORAL | Status: DC
  Administered 2011-10-03 – 2011-10-04 (×2): 30 mg via ORAL
  Filled 2011-10-03 (×3): qty 1

## 2011-10-03 MED ORDER — ACETAMINOPHEN 325 MG PO TABS
650.0000 mg | ORAL_TABLET | Freq: Four times a day (QID) | ORAL | Status: DC | PRN
  Administered 2011-10-03: 650 mg via ORAL
  Filled 2011-10-03: qty 2

## 2011-10-03 MED ORDER — INSULIN ASPART 100 UNIT/ML ~~LOC~~ SOLN
0.0000 [IU] | Freq: Three times a day (TID) | SUBCUTANEOUS | Status: DC
  Administered 2011-10-03: 11 [IU] via SUBCUTANEOUS
  Administered 2011-10-04: 3 [IU] via SUBCUTANEOUS

## 2011-10-03 MED ORDER — ASPIRIN 81 MG PO CHEW
324.0000 mg | CHEWABLE_TABLET | Freq: Once | ORAL | Status: AC
  Administered 2011-10-03: 324 mg via ORAL
  Filled 2011-10-03: qty 4

## 2011-10-03 MED ORDER — MORPHINE SULFATE 2 MG/ML IJ SOLN: 1.0000 mg | INTRAMUSCULAR | Status: DC | PRN

## 2011-10-03 MED ORDER — ENOXAPARIN SODIUM 40 MG/0.4ML ~~LOC~~ SOLN
40.0000 mg | SUBCUTANEOUS | Status: DC
  Administered 2011-10-03: 40 mg via SUBCUTANEOUS
  Filled 2011-10-03 (×2): qty 0.4

## 2011-10-03 MED ORDER — ACETAMINOPHEN 650 MG RE SUPP: 650.0000 mg | Freq: Four times a day (QID) | RECTAL | Status: DC | PRN

## 2011-10-03 MED ORDER — LISINOPRIL 20 MG PO TABS
20.0000 mg | ORAL_TABLET | Freq: Every day | ORAL | Status: DC
  Administered 2011-10-03: 20 mg via ORAL
  Filled 2011-10-03 (×2): qty 1

## 2011-10-03 MED ORDER — INSULIN ASPART 100 UNIT/ML ~~LOC~~ SOLN: 0.0000 [IU] | Freq: Every day | SUBCUTANEOUS | Status: DC

## 2011-10-03 NOTE — Progress Notes (Signed)
  Echocardiogram 2D Echocardiogram has been performed.  Shawn Hood [facility] 10/03/2011, 10:19 AM

## 2011-10-03 NOTE — ED Notes (Signed)
Advised receiving RN I would wait for orders to send the patient to the floor.

## 2011-10-03 NOTE — Progress Notes (Signed)
Utilization Review Completed.Elisavet Buehrer T4/03/2012   

## 2011-10-03 NOTE — ED Provider Notes (Signed)
History     CSN: [id number]  Arrival date & time 10/02/11  2259   First MD Initiated Contact with Patient 10/03/11 0211      Chief Complaint  Patient presents with  . Chest Pain    (Consider location/radiation/quality/duration/timing/severity/associated sxs/prior treatment) Patient is a 56 y.o. male presenting with chest pain. The history is provided by the patient. No language interpreter was used.  Chest Pain The chest pain began yesterday. Chest pain occurs frequently. The chest pain is unchanged. Associated with: nothing. The severity of the pain is moderate. The quality of the pain is described as dull. The pain does not radiate. Exacerbated by: nothing. Primary symptoms include shortness of breath.  Pertinent negatives for associated symptoms include no weakness. He tried nothing for the symptoms. Risk factors include being elderly and male gender.  His past medical history is significant for MI.  Procedure history is positive for cardiac catheterization and echocardiogram.   Having numbness in the back of the head, left arm pain and right CP at rest off and on since yesterday.  Similar symptoms to when he had a "mild heart attack" last year.    Past Medical History  Diagnosis Date  . Diabetes mellitus   . Hypertension   . Coronary artery disease     Past Surgical History  Procedure Date  . Coronary artery bypass graft     No family history on file.  History  Substance Use Topics  . Smoking status: Never Smoker   . Smokeless tobacco: Not on file  . Alcohol Use: No      Review of Systems  Constitutional: Negative.   HENT: Negative.   Eyes: Negative.   Respiratory: Positive for shortness of breath.   Cardiovascular: Positive for chest pain.  Gastrointestinal: Negative.   Genitourinary: Negative.   Musculoskeletal: Negative.   Neurological: Negative for weakness.  Hematological: Negative.   Psychiatric/Behavioral: Negative.     Allergies  Review of  patient's allergies indicates no known allergies.  Home Medications   Current Outpatient Rx  Name Route Sig Dispense Refill  . ASPIRIN EC 325 MG PO TBEC Oral Take 325 mg by mouth daily.    Marland Kitchen CLOPIDOGREL BISULFATE 75 MG PO TABS Oral Take 75 mg by mouth daily.    . INSULIN ASPART 100 UNIT/ML [place] SOLN Subcutaneous Inject 25 Units into the skin 3 (three) times daily before meals.    . INSULIN GLARGINE 100 UNIT/ML [place] SOLN Subcutaneous Inject 40 Units into the skin 2 (two) times daily.    Marland Kitchen METFORMIN HCL 500 MG PO TABS Oral Take 1,000 mg by mouth 2 (two) times daily with a meal.    . QUINAPRIL HCL 20 MG PO TABS Oral Take 20 mg by mouth at bedtime.      BP 135/79  Pulse 60  Temp(Src) 98.1 F (36.7 C) (Oral)  Resp 18  Ht 5\' 11"  (1.803 m)  Wt 224 lb (101.606 kg)  BMI 31.24 kg/m2  SpO2 99%  Physical Exam  Constitutional: He is oriented to person, place, and time. He appears well-developed and well-nourished. No distress.  HENT:  Head: Normocephalic and atraumatic.  Mouth/Throat: Oropharynx is clear and moist.  Eyes: Conjunctivae are normal. Pupils are equal, round, and reactive to light.  Neck: Normal range of motion. Neck supple.  Cardiovascular: Normal rate and regular rhythm.   Pulmonary/Chest: Effort normal and breath sounds normal.  Abdominal: Soft. Bowel sounds are normal. There is no tenderness.  Musculoskeletal: Normal range of  motion. He exhibits no edema.  Neurological: He is alert and oriented to person, place, and time.  Skin: Skin is warm and dry.  Psychiatric: He has a normal mood and affect.    ED Course  Procedures (including critical care time)  Labs Reviewed  DIFFERENTIAL - Abnormal; Notable for the following:    Lymphocytes Relative 50 (*)    All other components within normal limits  BASIC METABOLIC PANEL - Abnormal; Notable for the following:    Sodium 132 (*)    Chloride 95 (*)    Glucose, Bld 494 (*)    All other components within normal limits  CBC    POCT I-STAT TROPONIN I   Dg Chest 2 View  10/03/2011  *RADIOLOGY REPORT*  Clinical Data: Chest pain.  CHEST - 2 VIEW  Comparison: 10/03/2010.  Findings: Minimal peribronchial thickening.  Mild central pulmonary vascular prominence.  Post CABG.  Heart size within normal limits. No infiltrate, congestive heart failure or pneumothorax.  IMPRESSION: No acute abnormality.  Original Report Authenticated By: Fuller Canada, M.D.     No diagnosis found.    MDM   Date: 10/03/2011  Rate: 93  Rhythm: normal sinus rhythm  QRS Axis: left  Intervals: normal  ST/T Wave abnormalities: nonspecific ST/T changes  Conduction Disutrbances:none  Narrative Interpretation: poor r wave progression  Old EKG Reviewed: changes noted   Case d/w       Jusitn Salsgiver Smitty Cords, MD 10/03/11 [phone]

## 2011-10-03 NOTE — H&P (Signed)
Shawn Hood is an 56 y.o. male.   Chief Complaint: Chest pain HPI: A 56 year old gentleman with known history of coronary artery disease status post coronary artery bypass grafting who has been doing fine until yesterday when he started feeling left-sided chest pressure and pain. Pain radiated down his left arm and to his left jaw he's had these intermittent lasting about 1-2 minutes rated a 6-7/10. He did not take anything for it prior to coming to the emergency room however the pain responded to nitroglycerin in the emergency room. He denied nausea vomiting or dizziness no diaphoresis. He has had recent follow up with cardiology in the discussion of the exact date. It is not clear if patient had a recent stress test also but he did have a cath prior to prior to last year. His EKG showed mild nonspecific lateral lead changes that is slightly different from his previous EKG 2 years ago. His cardiac enzymes so far negative. Cardiology want medicine to admit the patient and consult them if needed.  Past Medical History  Diagnosis Date  . Diabetes mellitus   . Hypertension   . Coronary artery disease     Past Surgical History  Procedure Date  . Coronary artery bypass graft     History reviewed. No pertinent family history. Social History:  reports that he has never smoked. He does not have any smokeless tobacco history on file. He reports that he does not drink alcohol or use illicit drugs.  Allergies: No Known Allergies  Medications Prior to Admission  Medication Dose Route Frequency Provider Last Rate Last Dose  . aspirin chewable tablet 324 mg  324 mg Oral Once April K Palumbo-Rasch, MD   324 mg at 10/03/11 0227   Medications Prior to Admission  Medication Sig Dispense Refill  . aspirin EC 325 MG tablet Take 325 mg by mouth daily.      . clopidogrel (PLAVIX) 75 MG tablet Take 75 mg by mouth daily.      . insulin aspart (NOVOLOG) 100 UNIT/ML injection Inject 25 Units into the skin 3  (three) times daily before meals.      . insulin glargine (LANTUS) 100 UNIT/ML injection Inject 40 Units into the skin 2 (two) times daily.      . metFORMIN (GLUCOPHAGE) 500 MG tablet Take 1,000 mg by mouth 2 (two) times daily with a meal.      . quinapril (ACCUPRIL) 20 MG tablet Take 20 mg by mouth at bedtime.        Results for orders placed during the hospital encounter of 10/02/11 (from the past 48 hour(s))  CBC     Status: Normal   Collection Time   10/02/11 11:08 PM      Component Value Range Comment   WBC 5.4  4.0 - 10.5 (K/uL)    RBC 4.99  4.22 - 5.81 (MIL/uL)    Hemoglobin 14.3  13.0 - 17.0 (g/dL)    HCT 16.1  09.6 - 04.5 (%)    MCV 80.8  78.0 - 100.0 (fL)    MCH 28.7  26.0 - 34.0 (pg)    MCHC 35.5  30.0 - 36.0 (g/dL)    RDW 40.9  81.1 - 91.4 (%)    Platelets 244  150 - 400 (K/uL)   DIFFERENTIAL     Status: Abnormal   Collection Time   10/02/11 11:08 PM      Component Value Range Comment   Neutrophils Relative 43  43 - 77 (%)  Neutro Abs 2.3  1.7 - 7.7 (K/uL)    Lymphocytes Relative 50 (*) 12 - 46 (%)    Lymphs Abs 2.7  0.7 - 4.0 (K/uL)    Monocytes Relative 5  3 - 12 (%)    Monocytes Absolute 0.3  0.1 - 1.0 (K/uL)    Eosinophils Relative 2  0 - 5 (%)    Eosinophils Absolute 0.1  0.0 - 0.7 (K/uL)    Basophils Relative 1  0 - 1 (%)    Basophils Absolute 0.0  0.0 - 0.1 (K/uL)   BASIC METABOLIC PANEL     Status: Abnormal   Collection Time   10/02/11 11:08 PM      Component Value Range Comment   Sodium 132 (*) 135 - 145 (mEq/L)    Potassium 3.9  3.5 - 5.1 (mEq/L)    Chloride 95 (*) 96 - 112 (mEq/L)    CO2 24  19 - 32 (mEq/L)    Glucose, Bld 494 (*) 70 - 99 (mg/dL)    BUN 14  6 - 23 (mg/dL)    Creatinine, Ser 1.61  0.50 - 1.35 (mg/dL)    Calcium 9.5  8.4 - 10.5 (mg/dL)    GFR calc non Af Amer >90  >90 (mL/min)    GFR calc Af Amer >90  >90 (mL/min)   POCT I-STAT TROPONIN I     Status: Normal   Collection Time   10/02/11 11:36 PM      Component Value Range Comment    Troponin i, poc 0.00  0.00 - 0.08 (ng/mL)    Comment 3            GLUCOSE, CAPILLARY     Status: Abnormal   Collection Time   10/03/11  5:13 AM      Component Value Range Comment   Glucose-Capillary 347 (*) 70 - 99 (mg/dL)    Comment 1 Notify RN      Comment 2 Documented in Chart      Dg Chest 2 View  10/03/2011  *RADIOLOGY REPORT*  Clinical Data: Chest pain.  CHEST - 2 VIEW  Comparison: 10/03/2010.  Findings: Minimal peribronchial thickening.  Mild central pulmonary vascular prominence.  Post CABG.  Heart size within normal limits. No infiltrate, congestive heart failure or pneumothorax.  IMPRESSION: No acute abnormality.  Original Report Authenticated By: Fuller Canada, M.D.    Review of Systems  Constitutional: Negative.  Negative for diaphoresis.  HENT: Negative.   Eyes: Negative.   Respiratory: Positive for shortness of breath. Negative for cough, hemoptysis, sputum production and wheezing.   Cardiovascular: Positive for chest pain and palpitations. Negative for orthopnea, claudication, leg swelling and PND.  Gastrointestinal: Negative for nausea, vomiting, blood in stool and melena.  Genitourinary: Negative.   Musculoskeletal: Negative.   Skin: Negative.   Neurological: Negative.   Endo/Heme/Allergies: Negative.   Psychiatric/Behavioral: Negative.     Blood pressure 139/82, pulse 52, temperature 98.1 F (36.7 C), temperature source Oral, resp. rate 19, height 5\' 11"  (1.803 m), weight 101.606 kg (224 lb), SpO2 100.00%. Physical Exam  Constitutional: He is oriented to person, place, and time. He appears well-developed and well-nourished.  HENT:  Head: Normocephalic and atraumatic.  Right Ear: External ear normal.  Left Ear: External ear normal.  Nose: Nose normal.  Mouth/Throat: Oropharynx is clear and moist.  Eyes: Conjunctivae and EOM are normal. Pupils are equal, round, and reactive to light.  Cardiovascular: Normal rate, regular rhythm, normal heart sounds and intact  distal pulses.   Respiratory: Effort normal and breath sounds normal.  GI: Soft. Bowel sounds are normal.  Musculoskeletal: Normal range of motion.  Neurological: He is alert and oriented to person, place, and time. He has normal reflexes.  Skin: Skin is warm and dry.  Psychiatric: He has a normal mood and affect. His behavior is normal. Judgment and thought content normal.     Assessment/Plan Assessment this is a 56 year old gentleman with known coronary artery disease presenting with what appears to be angina. These have so far been relieved by nitroglycerin. No active chest pain at the moment however patient is also known to have coronary artery disease and could still have acute coronary syndrome. Plan #1 chest pain: We'll admit the patient for MI rule out. He'll be on Lovenox but not therapeutic dose statin aspirin continue with his Plavix and also check serial cardiac enzymes and give him some morphine. Depending on how he responds to these we will consider cardiology consult for further evaluation. Since patient has had a cath not too long ago he probably would benefit mainly from a stress test if possible. #2 diabetes I will hold his metformin and start sliding scale insulin and continue with his home insulin regimen. #3 hypertension his blood pressure is controlled on his home medications which we will continue. In addition he will have nitroglycerin patches. #4 hyponatremia: His sodium level is slightly low probably from some home diuresis. We'll follow his sodium level and have some gentle saline infusion. Marishka Rentfrow,LAWAL 10/03/2011, 7:04 AM

## 2011-10-03 NOTE — ED Notes (Signed)
Called and gave report to [place].

## 2011-10-03 NOTE — ED Notes (Signed)
The patient states he had pain in the back of his head and in his left arm that went away at 1700 yesterday.

## 2011-10-03 NOTE — Progress Notes (Signed)
Subjective: Asymptomatic.  Objective: Vital signs in last 24 hours: Temp:  [97.5 F (36.4 C)-98.1 F (36.7 C)] 97.5 F (36.4 C) (04/10 0833) Pulse Rate:  [52-91] 56  (04/10 0833) Resp:  [16-20] 18  (04/10 0833) BP: (130-161)/(75-87) 133/83 mmHg (04/10 0833) SpO2:  [96 %-100 %] 98 % (04/10 0833) Weight:  [101.606 kg (224 lb)] 101.606 kg (224 lb) (04/10 0833) Weight change:     Intake/Output from previous day:       Physical Exam: General: Comfortable, alert, communicative, fully oriented, not short of breath at rest.  HEENT:  No clinical pallor, no jaundice, no conjunctival injection or discharge. Hydration status is fair. NECK:  Supple, JVP not seen, no carotid bruits, no palpable lymphadenopathy, no palpable goiter. CHEST:  Clinically clear to auscultation, no wheezes, no crackles. HEART:  Sounds 1 and 2 heard, normal, regular, no murmurs. ABDOMEN:  Full, soft, non-tender, no palpable organomegaly, no palpable masses, normal bowel sounds. GENITALIA:  Not examined. LOWER EXTREMITIES:  No pitting edema, palpable peripheral pulses. MUSCULOSKELETAL SYSTEM:  Old surgical scar over left shoulder, otherwise, unremarkable. CENTRAL NERVOUS SYSTEM:  No focal neurologic deficit on gross examination.  Lab Results:  Basename 10/02/11 2308  WBC 5.4  HGB 14.3  HCT 40.3  PLT 244    Basename 10/02/11 2308  NA 132*  K 3.9  CL 95*  CO2 24  GLUCOSE 494*  BUN 14  CREATININE 0.79  CALCIUM 9.5   No results found for this or any previous visit (from the past 240 hour(s)).   Studies/Results: Dg Chest 2 View  10/03/2011  *RADIOLOGY REPORT*  Clinical Data: Chest pain.  CHEST - 2 VIEW  Comparison: 10/03/2010.  Findings: Minimal peribronchial thickening.  Mild central pulmonary vascular prominence.  Post CABG.  Heart size within normal limits. No infiltrate, congestive heart failure or pneumothorax.  IMPRESSION: No acute abnormality.  Original Report Authenticated By: Fuller Canada,  M.D.    Medications: Scheduled Meds:   . aspirin  324 mg Oral Once   Continuous Infusions:  PRN Meds:.  Assessment/Plan:  Principal Problem:  *Chest pain: Patient is now asymptomatic. He has a history of CAD, s/p CABG 2002, s/p negative stress myoview 03/2010, s/p NSEMI 09/2010. Cardiac cath at that time, performed by Dr Kirke Corin, showed severe 2-vessel disease, with patent grafts, chronically occluded OM-2/OM-1/Diagonal and distal LAD disease, not amenable to PTCA. Aggressive medical management was recommended. Clinically, this appears to be stable angina. We are currently cycling cardiac enzymes, and will consult [place] cardiology for recommendations, in view of multiple risk factors of HTN, DM, dyslipidemia, cerebrovascular disease, including remote rt pons CVA in 2007 and previous TIAs. Will add Imdur to current management. Heart rate is borderline low, so unable to utilize beta-blockade. Active Problems:  1. DM (diabetes mellitus): This is uncontrolled. Will increase Lantus to 50 units b.i.d. Meanwhile, diet/SSI. Check HBA1C.  2. HTN (hypertension): Controlled.  3. Dyslipidemia: Commence Statin, check lipid profile and TSH.  Disposition: Per cardiology.    LOS: 1 day   Kelii Chittum,CHRISTOPHER 10/03/2011, 8:50 AM

## 2011-10-03 NOTE — Consult Note (Signed)
CARDIOLOGY CONSULT NOTE   Patient ID: Shawn Hood MRN: [id number] DOB/AGE: 12-16-1955 56 y.o.  Admit date: 10/02/2011  Primary Physician   Dr Lawerance Bach in the [facility] Primary Cardiologist   Gwen Pounds at [place] (not in the last year) Reason for Consultation   Chest pain  WUJ:WJXB Marian is a 56 y.o. male with a history of CAD.  Pt had balance problems last 2 days, description is more like presyncope. Some symptoms have been orthostatic in nature. Yesterday had 1 while driving. This is the reason he came to the hospital. The episodes are brief and resolve with rest in less than 5 minutes.   He was admitted overnight and had a negative POC Troponin but no other enzymes done yet. His chest pain was with exertion, not a high level, no recent change in the frequency or intensity of episodes.   Overnight, he has had bradycardia on telemetry with HRs 40s but denies presyncope with this. He has had no chest pain but has not been OOB much.   Past Medical History  Diagnosis Date  . Diabetes mellitus   . Hypertension   . Coronary artery disease      Past Surgical History  Procedure Date  . Coronary artery bypass graft     No Known Allergies  I have reviewed the patient's current medications . aspirin  324 mg Oral Once  . aspirin EC  325 mg Oral Daily  . atorvastatin  10 mg Oral q1800  . clopidogrel  75 mg Oral Q breakfast  . enoxaparin  40 mg Subcutaneous Q24H  . insulin aspart  0-15 Units Subcutaneous TID WC  . insulin aspart  0-5 Units Subcutaneous QHS  . insulin aspart  25 Units Subcutaneous TID AC  . insulin glargine  50 Units Subcutaneous BID  . isosorbide mononitrate  30 mg Oral Daily  . lisinopril  20 mg Oral QHS     . sodium chloride     acetaminophen, acetaminophen, morphine, ondansetron (ZOFRAN) IV, ondansetron  Prescriptions prior to admission  Medication Sig Dispense Refill  . aspirin EC 325 MG tablet Take 325 mg by mouth daily.      . clopidogrel  (PLAVIX) 75 MG tablet Take 75 mg by mouth daily.      . insulin aspart (NOVOLOG) 100 UNIT/ML injection Inject 25 Units into the skin 3 (three) times daily before meals.      . insulin glargine (LANTUS) 100 UNIT/ML injection Inject 40 Units into the skin 2 (two) times daily.      . metFORMIN (GLUCOPHAGE) 500 MG tablet Take 1,000 mg by mouth 2 (two) times daily with a meal.      . quinapril (ACCUPRIL) 20 MG tablet Take 20 mg by mouth at bedtime.            History   Social History  . Marital Status: Married    Spouse Name: N/A    Number of Children: N/A  . Years of Education: N/A   Occupational History  . Not on file.   Social History Main Topics  . Smoking status: Former Smoker -- 1.5 packs/day for 10 years    Types: Cigarettes  . Smokeless tobacco: Not on file   Comment: Quit 1993  . Alcohol Use: No  . Drug Use: No  . Sexually Active: Not on file   Other Topics Concern  . Not on file   Social History Narrative   Disabled from heart problems, former Education officer, environmental. No  hx ETOH abuse.  Mother died at 73 years old of a heart attack, Father is living in his late 79s with no CAD. 1 Brother died with CAD but had multiple other medical problems.     ROS: He has chronic left-sided weakness since his CVA. He has no GI symptoms or recent illnesses with fevers or chills.  Full 14 point review of systems complete and found to be negative unless listed above.  Physical Exam: Blood pressure 133/83, pulse 56, temperature 97.5 F (36.4 C), temperature source Oral, resp. rate 18, height 5\' 11"  (1.803 m), weight 224 lb (101.606 kg), SpO2 98.00%.  General: Well developed, well nourished, male in no acute distress Head: Eyes PERRLA, No xanthomas.   Normocephalic and atraumatic, oropharynx without edema or exudate. Dentition OK Lungs: Few Bilateral basilar rales Heart: HRRR S1 S2, no rub/gallop, soft systolic murmur. pulses are 2+ all 4 extrem.   Neck: No carotid bruit. No lymphadenopathy.  JVD  not elevated. Abdomen: Bowel sounds present, abdomen soft and non-tender without masses or hernias noted. Msk:  No spine or cva tenderness. Slight left-sided weakness, no joint deformities or effusions. Extremities: No clubbing or cyanosis. No edema.  Neuro: Alert and oriented X 3. Chronic left weakness.  Psych:  Good affect, responds appropriately Skin: No rashes or lesions noted.  Labs:   Lab Results  Component Value Date   WBC 5.4 10/02/2011   HGB 14.3 10/02/2011   HCT 40.3 10/02/2011   MCV 80.8 10/02/2011   PLT 244 10/02/2011     Lab 10/02/11 2308  NA 132*  K 3.9  CL 95*  CO2 24  BUN 14  CREATININE 0.79  CALCIUM 9.5  PROT --  BILITOT --  ALKPHOS --  ALT --  AST --  GLUCOSE 494*    Basename 10/02/11 2336  TROPIPOC 0.00   Cardiac Cath: 4/12 CORONARY ANGIOGRAPHY: Left main coronary artery: The vessel was normal  in size and free of significant disease. It has mild atherosclerosis.  Left anterior descending artery: The vessel was normal in size and  moderately calcified in the proximal and mid segment. It is occluded in  the mid segment. It does give two medium size diagonal branches, which  are subtotally occluded, and they appeared to be getting collaterals  from the right coronary artery.  Left circumflex artery: The vessel was occluded in the proximal segment.  Right coronary artery: The vessel was normal in size. There is a 40%  discrete lesion in the proximal segment as well as tubular 30% disease  in the mid segment. The right coronary artery is giving collaterals to  the proximal left circumflex distribution likely OM-2 and OM-1.  SVG to OM-3: The graft was patent with a mild 20% proximal disease.  There is no other obstructive disease. There does seem to be  collaterals supplying the other OM branches. It seems that the proximal  OM branches were not bypassed.  SVG to right PDA: Graft is patent with 30% proximal disease. There is  no other obstructive disease.  There are extensive collaterals going to  what seems to be diagonal distribution.  LIMA artery: The vessel was patent with no significant disease in the  graft. However, in the LAD, there is diffuse 80% disease distally close  to the apex. This is not approachable for PCI due to distal location,  and it does appear to be chronic.  STUDY CONCLUSIONS:  1. Severe two-vessel coronary artery disease with patent grafts.  2.  Chronically occluded OM-2 and possibly OM-1 as well as diagonal  branches, which seems to be supplied by good collaterals.  3. Distal LAD disease close to the apex, which appears to be chronic  and not approachable for percutaneous angioplasty.  4. No culprit was identified for a small non-ST elevation myocardial  infarction.  5. Normal LV systolic function with only mildly elevated left  ventricular end-diastolic pressure.   Echo: 4/12 Study Conclusions  - Left ventricle: The cavity size was normal. Wall thickness was  increased in a pattern of mild LVH. Systolic function was normal.  The estimated ejection fraction was in the range of 55% to 60%.  - Mitral valve: Mild regurgitation.  ECG: 02-Oct-2011 23:04:43  Normal sinus rhythm Possible Left atrial enlargement Left axis deviation Junctional ST depression, probably normal Vent. rate 93 BPM PR interval 148 ms QRS duration 84 ms QT/QTc 334/415 ms P-R-T axes 77 -46 56   Radiology:  Dg Chest 2 View 10/03/2011  *RADIOLOGY REPORT*  Clinical Data: Chest pain.  CHEST - 2 VIEW  Comparison: 10/03/2010.  Findings: Minimal peribronchial thickening.  Mild central pulmonary vascular prominence.  Post CABG.  Heart size within normal limits. No infiltrate, congestive heart failure or pneumothorax.  IMPRESSION: No acute abnormality.  Original Report Authenticated By: Fuller Canada, M.D.    ASSESSMENT AND PLAN:   The patient was seen today by Dr Ladona Ridgel, the patient evaluated and the data reviewed.    *Chest pain - Cath 2012  with medical therapy recommended, his symptoms have not changed recently. MD review data and advise on OP MV.  Bradycardia with ?presyncope - unclear if there is any association between them - will have staff ambulate him and monitor HR/Symptoms, ck orthostatics as well. He is not on rate-lowering meds.  Signed: Theodore Demark 10/03/2011, 9:54 AM Co-Sign MD  Cardiology Attending  Patient seen and independently examined. Agree with the findings as noted by Theodore Demark. I concur. He complains mainly of dizziness with no obvious etiology. I have discussed the possible causes. He will undergo orthostatic vital evaluation and observation on telemetry. I do not think another myoview will be helpful. Ok to discharge tomorrow unless other issues develop.

## 2011-10-04 DIAGNOSIS — R42 Dizziness and giddiness: Secondary | ICD-10-CM

## 2011-10-04 LAB — COMPREHENSIVE METABOLIC PANEL
AST: 13 U/L (ref 0–37)
BUN: 20 mg/dL (ref 6–23)
CO2: 24 mEq/L (ref 19–32)
Calcium: 9.3 mg/dL (ref 8.4–10.5)
Chloride: 102 mEq/L (ref 96–112)
Creatinine, Ser: 0.85 mg/dL (ref 0.50–1.35)
GFR calc Af Amer: >90 mL/min (ref >90)
GFR calc non Af Amer: >90 mL/min (ref >90)
Glucose, Bld: 108 mg/dL — ABNORMAL HIGH (ref 70–99)
Total Bilirubin: 0.5 mg/dL (ref 0.3–1.2)

## 2011-10-04 LAB — CARDIAC PANEL(CRET KIN+CKTOT+MB+TROPI)
Relative Index: INVALID (ref 0.0–2.5)
Total CK: 62 U/L (ref 7–232)

## 2011-10-04 LAB — CBC
Hemoglobin: 13.2 g/dL (ref 13.0–17.0)
Platelets: 217 10*3/uL (ref 150–400)
RBC: 4.59 MIL/uL (ref 4.22–5.81)
WBC: 6.8 10*3/uL (ref 4.0–10.5)

## 2011-10-04 LAB — GLUCOSE, CAPILLARY
Glucose-Capillary: 122 mg/dL — ABNORMAL HIGH (ref 70–99)
Glucose-Capillary: 103 mg/dL — ABNORMAL HIGH (ref 70–99)

## 2011-10-04 MED ORDER — ISOSORBIDE MONONITRATE ER 30 MG PO TB24: 30.0000 mg | ORAL_TABLET | Freq: Every day | ORAL | Status: DC

## 2011-10-04 MED ORDER — ATORVASTATIN CALCIUM 10 MG PO TABS: 10.0000 mg | ORAL_TABLET | Freq: Every day | ORAL | Status: DC

## 2011-10-04 MED ORDER — INSULIN GLARGINE 100 UNIT/ML ~~LOC~~ SOLN: 50.0000 [IU] | Freq: Two times a day (BID) | SUBCUTANEOUS | Status: DC

## 2011-10-04 NOTE — Progress Notes (Signed)
Around 0615 pt complained of dizziness when ambulating to the restroom. VS were done and are charted. CBG was 122. MD on call notified. Will continue to monitor the pt.  Sanda Linger

## 2011-10-04 NOTE — Progress Notes (Signed)
SUBJECTIVE: The patient is doing well today. Mild postural dizziness upon standing earlier today, now resolved.  At this time, he denies chest pain, shortness of breath, or any new concerns.     Marland Kitchen aspirin EC  325 mg Oral Daily  . atorvastatin  10 mg Oral q1800  . clopidogrel  75 mg Oral Q breakfast  . enoxaparin  40 mg Subcutaneous Q24H  . insulin aspart  0-15 Units Subcutaneous TID WC  . insulin aspart  0-5 Units Subcutaneous QHS  . insulin glargine  50 Units Subcutaneous BID  . isosorbide mononitrate  30 mg Oral Daily  . lisinopril  20 mg Oral QHS  . DISCONTD: insulin aspart  25 Units Subcutaneous TID AC  . DISCONTD: insulin glargine  40 Units Subcutaneous BID  . DISCONTD: nitroGLYCERIN  0.2 mg Transdermal Daily  . DISCONTD: quinapril  20 mg Oral QHS      . sodium chloride 75 mL/hr at 10/04/11 0001    OBJECTIVE: Physical Exam: Filed Vitals:   10/03/11 2214 10/04/11 0500 10/04/11 0620 10/04/11 0625  BP: 124/71 105/57 113/72 120/70  Pulse:  58 58   Temp:  97.6 F (36.4 C) 98.2 F (36.8 C)   TempSrc:  Oral Oral   Resp:  16 20   Height:      Weight:      SpO2:  95% 97%     Intake/Output Summary (Last 24 hours) at 10/04/11 0805 Last data filed at 10/03/11 1700  Gross per 24 hour  Intake    240 ml  Output      0 ml  Net    240 ml    Telemetry reveals sinus rhythm, HR 50s  GEN- The patient is well appearing, alert and oriented x 3 today.   Head- normocephalic, atraumatic Eyes-  Sclera clear, conjunctiva pink Ears- hearing intact Oropharynx- clear Neck- supple, no JVP Lymph- no cervical lymphadenopathy Lungs- Clear to ausculation bilaterally, normal work of breathing Heart- Regular rate and rhythm, no murmurs, rubs or gallops, PMI not laterally displaced GI- soft, NT, ND, + BS Extremities- no clubbing, cyanosis, or edema Skin- no rash or lesion Psych- euthymic mood, full affect Neuro- strength and sensation are intact  LABS: Basic Metabolic  Panel:  Basename 10/04/11 0130 10/03/11 1040 10/02/11 2308  NA 135 -- 132*  K 3.4* -- 3.9  CL 102 -- 95*  CO2 24 -- 24  GLUCOSE 108* -- 494*  BUN 20 -- 14  CREATININE 0.85 0.86 --  CALCIUM 9.3 -- 9.5  MG -- -- --  PHOS -- -- --   Liver Function Tests:  Basename 10/04/11 0130  AST 13  ALT 13  ALKPHOS 74  BILITOT 0.5  PROT 7.2  ALBUMIN 3.5   No results found for this basename: LIPASE:2,AMYLASE:2 in the last 72 hours CBC:  Basename 10/04/11 0130 10/03/11 1040 10/02/11 2308  WBC 6.8 4.9 --  NEUTROABS -- -- 2.3  HGB 13.2 14.5 --  HCT 37.0* 42.7 --  MCV 80.6 82.3 --  PLT 217 224 --   Cardiac Enzymes:  Basename 10/04/11 0130 10/03/11 1720 10/03/11 1040  CKTOTAL 62 73 74  CKMB 1.4 1.4 1.5  CKMBINDEX -- -- --  TROPONINI <0.30 <0.30 <0.30   BNP: No components found with this basename: POCBNP:3 D-Dimer: No results found for this basename: DDIMER:2 in the last 72 hours Hemoglobin A1C:  Basename 10/03/11 1040  HGBA1C 12.1*   Fasting Lipid Panel: No results found for this basename: CHOL,HDL,LDLCALC,TRIG,CHOLHDL,LDLDIRECT in  the last 72 hours Thyroid Function Tests:  Basename 10/03/11 1040  TSH 2.330  T4TOTAL --  T3FREE --  THYROIDAB --   Anemia Panel: No results found for this basename: VITAMINB12,FOLATE,FERRITIN,TIBC,IRON,RETICCTPCT in the last 72 hours  RADIOLOGY: Dg Chest 2 View  10/03/2011  *RADIOLOGY REPORT*  Clinical Data: Chest pain.  CHEST - 2 VIEW  Comparison: 10/03/2010.  Findings: Minimal peribronchial thickening.  Mild central pulmonary vascular prominence.  Post CABG.  Heart size within normal limits. No infiltrate, congestive heart failure or pneumothorax.  IMPRESSION: No acute abnormality.  Original Report Authenticated By: Fuller Canada, M.D.    ASSESSMENT AND PLAN:  Principal Problem:  *Chest pain Active Problems:  DM (diabetes mellitus)  HTN (hypertension)  Hyponatremia  1. Postural dizziness- likely due to dehydration Elevated  blood glucose may cause osmotic diuresis/ dehydration with A1C 12.1 Agressively blood sugar control and adequate hydration encouraged Bradycardia is unlikely the cause  2. CP- CMs negative, symptoms resolved Continue current medical therapy  No new recs I anticipate DC later today I will see as needed while here.     Hillis Range, MD 10/04/2011 8:05 AM

## 2011-10-04 NOTE — Progress Notes (Signed)
Utilization Review Completed.Shawn Hood T4/04/2012   

## 2011-10-04 NOTE — Discharge Summary (Signed)
Physician Discharge Summary  Patient ID: Shawn Hood MRN: [id number] DOB/AGE: 06/25/1956 56 y.o.  Admit date: 10/02/2011 Discharge date: 10/04/2011  Primary Care Physician:  Pcp Not In System Primary Cardiologist: Dickson.  Discharge Diagnoses:    Patient Active Problem List  Diagnoses  . Chest pain  . DM (diabetes mellitus)  . HTN (hypertension)  . Hyponatremia  . Postural dizziness    Medication List  As of 10/04/2011  2:13 PM   STOP taking these medications         insulin aspart 100 UNIT/ML injection         TAKE these medications         aspirin EC 325 MG tablet   Take 325 mg by mouth daily.      atorvastatin 10 MG tablet   Commonly known as: LIPITOR   Take 1 tablet (10 mg total) by mouth daily at 6 PM.      clopidogrel 75 MG tablet   Commonly known as: PLAVIX   Take 75 mg by mouth daily.      insulin glargine 100 UNIT/ML injection   Commonly known as: LANTUS   Inject 50 Units into the skin 2 (two) times daily.      isosorbide mononitrate 30 MG 24 hr tablet   Commonly known as: IMDUR   Take 1 tablet (30 mg total) by mouth daily.      metFORMIN 500 MG tablet   Commonly known as: GLUCOPHAGE   Take 1,000 mg by mouth 2 (two) times daily with a meal.      quinapril 20 MG tablet   Commonly known as: ACCUPRIL   Take 20 mg by mouth at bedtime.             Disposition and Follow-up:  Follow up with primary MD, and with Dr Lewayne Bunting, [facility] cardiologist.  Consults:  cardiology  Dr Lewayne Bunting.  Significant Diagnostic Studies:  Dg Chest 2 View  10/03/2011  *RADIOLOGY REPORT*  Clinical Data: Chest pain.  CHEST - 2 VIEW  Comparison: 10/03/2010.  Findings: Minimal peribronchial thickening.  Mild central pulmonary vascular prominence.  Post CABG.  Heart size within normal limits. No infiltrate, congestive heart failure or pneumothorax.  IMPRESSION: No acute abnormality.  Original Report Authenticated By: Fuller Canada, M.D.    Brief H and P: For  complete details, refer to admission H and P. However, in brief, this is a 56 year old gentleman, with known history of coronary artery disease status post coronary artery bypass grafting who presented with a 2 day history of recurrent left-sided chest pressure and pain. Pain radiated down his left arm and to his left jaw he's had these intermittent lasting about 1-2 minutes rated a 6-7/10. He did not take anything for it prior to coming to the emergency room however the pain responded to Nitroglycerin patch in the emergency room. He was admitted for further evaluation, investigation and management.  Physical Exam: On 10/04/11. General:   Alert, communicative, fully oriented, talking in complete sentences, not short of breath at rest.  HEENT:  No clinical pallor, no jaundice, no conjunctival injection or discharge. NECK:  Supple, JVP not seen, no carotid bruits, no palpable lymphadenopathy, no palpable goiter. CHEST:  Clinically clear to auscultation, no wheezes, no crackles. HEART:  Sounds 1 and 2 heard, normal, regular, no murmurs. ABDOMEN:  Full, soft, no scars, non-tender, no palpable organomegaly, no palpable masses, normal bowel sounds. GENITALIA:  Normal external genitalia. LOWER EXTREMITIES:  No pitting edema,  palpable peripheral pulses. MUSCULOSKELETAL SYSTEM:  Generalized osteoarthritic changes, otherwise, normal. CENTRAL NERVOUS SYSTEM:  No focal neurologic deficit on gross examination.  Hospital Course:  Principal Problem:  *Chest pain: Patient has a known history of CAD, s/p CABG 2002, s/p negative stress myoview 03/2010, s/p NSEMI 09/2010. Cardiac cath at that time, performed by Dr Kirke Corin, showed severe 2-vessel disease, with patent grafts, chronically occluded OM-2/OM-1/Diagonal and distal LAD disease, not amenable to PTCA. Aggressive medical management was recommended. Clinically, this appears to be stable angina. Cardiac enzymes were cycled, and remained un-elevated. [place] cardiology  consultation was provided by Dr Carlisle Beers, in view of multiple risk factors of HTN, DM, dyslipidemia, cerebrovascular disease, including remote rightt pons CVA in 2007 and previous TIAs. Imdur to current management, and as heart rate was borderline low, we were unable to utilize beta-blockade. Patient has had no recurrence of chest pain since hospitalization, and per cardiology, stable bradycardia is not contributory to his presentation. 2D Echocardiogram of 10/03/11, showed normal left ventricular cavity size was normal,  mild LVH. Ejection fraction was in the range of 50% to 55% and there were no regional wall motion abnormalities. No further cardiac testing is recommended. Active Problems:  1. DM (diabetes mellitus): This was uncontrolled and presentation, but was successfully managed with increase in Lantus to 50 units b.i.d, diet/SSI. HBA1C was 12.1.  2. HTN (hypertension): Controlled.  3. Dyslipidemia: Patient has been commenced on Statin. TSH was normal at 2.330. Lipid profile was still pending at the time of this evaluation.  Disposition: Stable for discharge on 10/04/11.  Time spent on Discharge: 45 mins.  Signed: Atoya Andrew,CHRISTOPHER 10/04/2011, 2:13 PM

## 2012-02-27 ENCOUNTER — Emergency Department (HOSPITAL_COMMUNITY): Payer: Medicare Other

## 2012-02-27 ENCOUNTER — Encounter (HOSPITAL_COMMUNITY): Payer: Self-pay

## 2012-02-27 ENCOUNTER — Inpatient Hospital Stay (HOSPITAL_COMMUNITY)
Admission: EM | Admit: 2012-02-27 | Discharge: 2012-03-01 | DRG: 247 | Disposition: A | Discharge status: Home / Self Care | Payer: Medicare Other | Attending: Internal Medicine | Admitting: Internal Medicine

## 2012-02-27 DIAGNOSIS — I200 Unstable angina: Secondary | ICD-10-CM | POA: Diagnosis present

## 2012-02-27 DIAGNOSIS — I498 Other specified cardiac arrhythmias: Secondary | ICD-10-CM | POA: Diagnosis not present

## 2012-02-27 DIAGNOSIS — Z7982 Long term (current) use of aspirin: Secondary | ICD-10-CM

## 2012-02-27 DIAGNOSIS — Z9119 Patient's noncompliance with other medical treatment and regimen: Secondary | ICD-10-CM

## 2012-02-27 DIAGNOSIS — R42 Dizziness and giddiness: Secondary | ICD-10-CM | POA: Diagnosis present

## 2012-02-27 DIAGNOSIS — I25810 Atherosclerosis of coronary artery bypass graft(s) without angina pectoris: Principal | ICD-10-CM | POA: Insufficient documentation

## 2012-02-27 DIAGNOSIS — Z91199 Patient's noncompliance with other medical treatment and regimen due to unspecified reason: Secondary | ICD-10-CM

## 2012-02-27 DIAGNOSIS — Z794 Long term (current) use of insulin: Secondary | ICD-10-CM

## 2012-02-27 DIAGNOSIS — Z79899 Other long term (current) drug therapy: Secondary | ICD-10-CM

## 2012-02-27 DIAGNOSIS — I10 Essential (primary) hypertension: Secondary | ICD-10-CM | POA: Diagnosis present

## 2012-02-27 DIAGNOSIS — E876 Hypokalemia: Secondary | ICD-10-CM | POA: Diagnosis not present

## 2012-02-27 DIAGNOSIS — Z8673 Personal history of transient ischemic attack (TIA), and cerebral infarction without residual deficits: Secondary | ICD-10-CM

## 2012-02-27 DIAGNOSIS — I2510 Atherosclerotic heart disease of native coronary artery without angina pectoris: Secondary | ICD-10-CM | POA: Diagnosis present

## 2012-02-27 DIAGNOSIS — R079 Chest pain, unspecified: Secondary | ICD-10-CM | POA: Diagnosis present

## 2012-02-27 DIAGNOSIS — Z87891 Personal history of nicotine dependence: Secondary | ICD-10-CM

## 2012-02-27 DIAGNOSIS — E785 Hyperlipidemia, unspecified: Secondary | ICD-10-CM | POA: Diagnosis present

## 2012-02-27 DIAGNOSIS — IMO0001 Reserved for inherently not codable concepts without codable children: Secondary | ICD-10-CM | POA: Diagnosis present

## 2012-02-27 DIAGNOSIS — I252 Old myocardial infarction: Secondary | ICD-10-CM

## 2012-02-27 DIAGNOSIS — Z955 Presence of coronary angioplasty implant and graft: Secondary | ICD-10-CM

## 2012-02-27 DIAGNOSIS — E119 Type 2 diabetes mellitus without complications: Secondary | ICD-10-CM | POA: Diagnosis present

## 2012-02-27 HISTORY — DX: Bradycardia, unspecified: R00.1

## 2012-02-27 LAB — APTT: aPTT: 25 seconds (ref 24–37)

## 2012-02-27 LAB — GLUCOSE, CAPILLARY
Glucose-Capillary: 363 mg/dL — ABNORMAL HIGH (ref 70–99)
Glucose-Capillary: 291 mg/dL — ABNORMAL HIGH (ref 70–99)

## 2012-02-27 LAB — COMPREHENSIVE METABOLIC PANEL
ALT: 10 U/L (ref 0–53)
BUN: 13 mg/dL (ref 6–23)
CO2: 27 mEq/L (ref 19–32)
Calcium: 9 mg/dL (ref 8.4–10.5)
Creatinine, Ser: 0.87 mg/dL (ref 0.50–1.35)
GFR calc Af Amer: >90 mL/min (ref >90)
GFR calc non Af Amer: >90 mL/min (ref >90)
Glucose, Bld: 291 mg/dL — ABNORMAL HIGH (ref 70–99)

## 2012-02-27 LAB — MAGNESIUM: Magnesium: 1.7 mg/dL (ref 1.5–2.5)

## 2012-02-27 LAB — CBC
HCT: 38.1 % — ABNORMAL LOW (ref 39.0–52.0)
Hemoglobin: 13.1 g/dL (ref 13.0–17.0)
MCH: 28.1 pg (ref 26.0–34.0)
MCV: 81.6 fL (ref 78.0–100.0)
RBC: 4.67 MIL/uL (ref 4.22–5.81)

## 2012-02-27 LAB — POCT I-STAT TROPONIN I: Troponin i, poc: 0 ng/mL (ref 0.00–0.08)

## 2012-02-27 LAB — URINALYSIS, ROUTINE W REFLEX MICROSCOPIC
Bilirubin Urine: NEGATIVE
Ketones, ur: NEGATIVE mg/dL
Nitrite: NEGATIVE
pH: 5.5 (ref 5.0–8.0)

## 2012-02-27 LAB — CK TOTAL AND CKMB (NOT AT ARMC): Relative Index: INVALID (ref 0.0–2.5)

## 2012-02-27 LAB — PROTIME-INR
INR: 0.93 (ref 0.00–1.49)
Prothrombin Time: 12.7 seconds (ref 11.6–15.2)

## 2012-02-27 LAB — URINE MICROSCOPIC-ADD ON

## 2012-02-27 LAB — TROPONIN I
Troponin I: <0.3 ng/mL (ref <0.30)
Troponin I: <0.3 ng/mL (ref <0.30)

## 2012-02-27 MED ORDER — ASPIRIN EC 325 MG PO TBEC
325.0000 mg | DELAYED_RELEASE_TABLET | Freq: Every day | ORAL | Status: DC
  Administered 2012-02-28 – 2012-03-01 (×2): 325 mg via ORAL
  Filled 2012-02-27 (×3): qty 1

## 2012-02-27 MED ORDER — QUINAPRIL HCL 10 MG PO TABS: 20.0000 mg | ORAL_TABLET | Freq: Every day | ORAL | Status: DC

## 2012-02-27 MED ORDER — INSULIN ASPART 100 UNIT/ML ~~LOC~~ SOLN
20.0000 [IU] | Freq: Two times a day (BID) | SUBCUTANEOUS | Status: DC
  Administered 2012-02-28 (×2): 20 [IU] via SUBCUTANEOUS

## 2012-02-27 MED ORDER — NITROGLYCERIN 0.4 MG SL SUBL: 0.4000 mg | SUBLINGUAL_TABLET | SUBLINGUAL | Status: DC | PRN

## 2012-02-27 MED ORDER — ATORVASTATIN CALCIUM 40 MG PO TABS
40.0000 mg | ORAL_TABLET | Freq: Every day | ORAL | Status: DC
  Administered 2012-02-27 – 2012-02-29 (×3): 40 mg via ORAL
  Filled 2012-02-27 (×5): qty 1

## 2012-02-27 MED ORDER — ENOXAPARIN SODIUM 40 MG/0.4ML ~~LOC~~ SOLN
40.0000 mg | SUBCUTANEOUS | Status: DC
  Administered 2012-02-27: 40 mg via SUBCUTANEOUS
  Filled 2012-02-27 (×2): qty 0.4

## 2012-02-27 MED ORDER — INSULIN ASPART 100 UNIT/ML ~~LOC~~ SOLN
0.0000 [IU] | Freq: Three times a day (TID) | SUBCUTANEOUS | Status: DC
  Administered 2012-02-27: 9 [IU] via SUBCUTANEOUS
  Administered 2012-02-28: 3 [IU] via SUBCUTANEOUS
  Administered 2012-02-28: 1 [IU] via SUBCUTANEOUS

## 2012-02-27 MED ORDER — MORPHINE SULFATE 2 MG/ML IJ SOLN
1.0000 mg | INTRAMUSCULAR | Status: DC | PRN
  Administered 2012-02-29: 1 mg via INTRAVENOUS
  Filled 2012-02-27: qty 1

## 2012-02-27 MED ORDER — INSULIN ASPART 100 UNIT/ML ~~LOC~~ SOLN
10.0000 [IU] | Freq: Once | SUBCUTANEOUS | Status: AC
  Administered 2012-02-27: 10 [IU] via INTRAVENOUS
  Filled 2012-02-27: qty 1

## 2012-02-27 MED ORDER — LISINOPRIL 20 MG PO TABS
20.0000 mg | ORAL_TABLET | Freq: Every day | ORAL | Status: DC
  Administered 2012-02-27 – 2012-02-29 (×3): 20 mg via ORAL
  Filled 2012-02-27 (×5): qty 1

## 2012-02-27 MED ORDER — ISOSORBIDE MONONITRATE ER 30 MG PO TB24
30.0000 mg | ORAL_TABLET | Freq: Every day | ORAL | Status: DC
  Administered 2012-02-28: 30 mg via ORAL
  Filled 2012-02-27 (×5): qty 1

## 2012-02-27 MED ORDER — INSULIN ASPART 100 UNIT/ML ~~LOC~~ SOLN
0.0000 [IU] | Freq: Every day | SUBCUTANEOUS | Status: DC
  Administered 2012-02-27: 3 [IU] via SUBCUTANEOUS
  Administered 2012-02-28 – 2012-02-29 (×2): 2 [IU] via SUBCUTANEOUS

## 2012-02-27 MED ORDER — CLOPIDOGREL BISULFATE 75 MG PO TABS
75.0000 mg | ORAL_TABLET | Freq: Every day | ORAL | Status: DC
  Administered 2012-02-28 – 2012-03-01 (×3): 75 mg via ORAL
  Filled 2012-02-27 (×3): qty 1

## 2012-02-27 MED ORDER — SODIUM CHLORIDE 0.9 % IJ SOLN
3.0000 mL | Freq: Two times a day (BID) | INTRAMUSCULAR | Status: DC
  Administered 2012-02-27 – 2012-02-28 (×2): 3 mL via INTRAVENOUS

## 2012-02-27 MED ORDER — POTASSIUM CHLORIDE CRYS ER 20 MEQ PO TBCR: 40.0000 meq | EXTENDED_RELEASE_TABLET | Freq: Once | ORAL | Status: DC

## 2012-02-27 MED ORDER — INSULIN GLARGINE 100 UNIT/ML ~~LOC~~ SOLN
50.0000 [IU] | Freq: Two times a day (BID) | SUBCUTANEOUS | Status: DC
  Administered 2012-02-27 – 2012-02-28 (×2): 50 [IU] via SUBCUTANEOUS

## 2012-02-27 MED ORDER — SODIUM CHLORIDE 0.9 % IV SOLN
1000.0000 mL | INTRAVENOUS | Status: DC
Start: 2012-02-27 — End: 2012-02-27

## 2012-02-27 MED ORDER — ASPIRIN 81 MG PO CHEW: 324.0000 mg | CHEWABLE_TABLET | Freq: Once | ORAL | Status: DC

## 2012-02-27 NOTE — ED Provider Notes (Signed)
History     CSN: [id number]  Arrival date & time 02/27/12  0736   First MD Initiated Contact with Patient 02/27/12 [phone]      Chief Complaint  Patient presents with  . Chest Pain    (Consider location/radiation/quality/duration/timing/severity/associated sxs/prior treatment) HPI Comments: This 56 year old man with a history of coronary artery disease, and who has had a coronary artery bypass graft in 2002. Today he woke up at 5:50 AM with a feeling of indigestion, pain in his left chest with tightness, and numbness in the left arm. He took aspirin without relief. EMS was called and they gave him 2 nitroglycerin sublingually and now his pain has been relieved to a significant extent. Initially he rated the pain at a 7 and now it is at a four.  Patient is a 56 y.o. male presenting with chest pain. The history is provided by the patient and medical records. No language interpreter was used.  Chest Pain The chest pain began 1 - 2 hours ago. Chest pain occurs intermittently. The chest pain is improving. Associated with: Woke with chest pain, like indigestion. At its most intense, the pain is at 7/10. The pain is currently at 4/10. The severity of the pain is moderate. The quality of the pain is described as pressure-like and tightness. The pain radiates to the left arm. Exacerbated by: Nothing. Pertinent negatives for primary symptoms include no fever and no shortness of breath.  Associated symptoms include numbness.  Pertinent negatives for associated symptoms include no diaphoresis, no lower extremity edema and no near-syncope. He tried nitroglycerin, aspirin and oxygen for the symptoms. Risk factors include male gender and sedentary lifestyle (Known coronary artery disease, with prior CABG).  His past medical history is significant for CAD, diabetes, hyperlipidemia, hypertension and MI.     Past Medical History  Diagnosis Date  . Diabetes mellitus   . Hypertension   . Coronary artery  disease   . Hyperlipidemia   . CVA (cerebral infarction)     less than 5 years ago  . Angina   . Stroke     per patient  . Shortness of breath     Past Surgical History  Procedure Date  . Coronary artery bypass graft   . Shoulder surgery   . Cholecystectomy     History reviewed. No pertinent family history.  History  Substance Use Topics  . Smoking status: Former Smoker -- 1.5 packs/day for 10 years    Types: Cigarettes  . Smokeless tobacco: Never Used   Comment: Quit 1993  . Alcohol Use: No      Review of Systems  Constitutional: Negative for fever, chills and diaphoresis.  HENT: Negative.   Eyes: Positive for discharge.  Respiratory: Negative for shortness of breath.   Cardiovascular: Positive for chest pain. Negative for leg swelling and near-syncope.  Gastrointestinal: Negative.   Genitourinary: Negative.   Musculoskeletal: Negative.   Skin: Negative.   Neurological: Positive for numbness.  Psychiatric/Behavioral: Negative.     Allergies  Review of patient's allergies indicates no known allergies.  Home Medications   Current Outpatient Rx  Name Route Sig Dispense Refill  . ASPIRIN EC 325 MG PO TBEC Oral Take 325 mg by mouth daily.    . ATORVASTATIN CALCIUM 10 MG PO TABS Oral Take 1 tablet (10 mg total) by mouth daily at 6 PM. 30 tablet 0  . CLOPIDOGREL BISULFATE 75 MG PO TABS Oral Take 75 mg by mouth daily.    . INSULIN  GLARGINE 100 UNIT/ML [place] SOLN Subcutaneous Inject 50 Units into the skin 2 (two) times daily. 10 mL 0  . ISOSORBIDE MONONITRATE ER 30 MG PO TB24 Oral Take 1 tablet (30 mg total) by mouth daily. 30 tablet 0  . METFORMIN HCL 500 MG PO TABS Oral Take 1,000 mg by mouth 2 (two) times daily with a meal.    . QUINAPRIL HCL 20 MG PO TABS Oral Take 20 mg by mouth at bedtime.      BP 120/70  Pulse 69  Resp 16  SpO2 99%  Physical Exam  Constitutional: He is oriented to person, place, and time. He appears well-developed and well-nourished. No  distress.  HENT:  Head: Normocephalic and atraumatic.  Right Ear: External ear normal.  Left Ear: External ear normal.  Eyes: Conjunctivae and EOM are normal. Pupils are equal, round, and reactive to light. No scleral icterus.  Neck: Normal range of motion. Neck supple.  Cardiovascular: Normal rate, regular rhythm and normal heart sounds.   Pulmonary/Chest: Effort normal and breath sounds normal.  Abdominal: Soft. Bowel sounds are normal.  Musculoskeletal: Normal range of motion. He exhibits no edema and no tenderness.  Neurological: He is alert and oriented to person, place, and time. He has normal reflexes.  Skin: Skin is warm and dry.  Psychiatric: He has a normal mood and affect. His behavior is normal.    ED Course  Procedures (including critical care time)   Labs Reviewed  CBC  COMPREHENSIVE METABOLIC PANEL  PROTIME-INR  APTT  URINALYSIS, ROUTINE W REFLEX MICROSCOPIC   7:59 AM Pt seen --> physical exam performed.  Old chart reviewed.  Lab workup for chest pain ordered.  8:09 AM  Date: 02/27/2012  Rate: 65  Rhythm: normal sinus rhythm  QRS Axis: normal PQRS:  Left atrial abnormality  Intervals: normal  ST/T Wave abnormalities: nonspecific T wave changes  Conduction Disutrbances:none  Narrative Interpretation: Abnormal EKG  Old EKG Reviewed: unchanged  10:56 AM Results for orders placed during the hospital encounter of 02/27/12  CBC      Component Value Range   WBC 4.9  4.0 - 10.5 K/uL   RBC 4.67  4.22 - 5.81 MIL/uL   Hemoglobin 13.1  13.0 - 17.0 g/dL   HCT 16.1 (*) 09.6 - 04.5 %   MCV 81.6  78.0 - 100.0 fL   MCH 28.1  26.0 - 34.0 pg   MCHC 34.4  30.0 - 36.0 g/dL   RDW 40.9  81.1 - 91.4 %   Platelets 205  150 - 400 K/uL  COMPREHENSIVE METABOLIC PANEL      Component Value Range   Sodium 136  135 - 145 mEq/L   Potassium 3.8  3.5 - 5.1 mEq/L   Chloride 102  96 - 112 mEq/L   CO2 27  19 - 32 mEq/L   Glucose, Bld 291 (*) 70 - 99 mg/dL   BUN 13  6 - 23 mg/dL    Creatinine, Ser 7.82  0.50 - 1.35 mg/dL   Calcium 9.0  8.4 - 95.6 mg/dL   Total Protein 7.0  6.0 - 8.3 g/dL   Albumin 3.3 (*) 3.5 - 5.2 g/dL   AST 14  0 - 37 U/L   ALT 10  0 - 53 U/L   Alkaline Phosphatase 93  39 - 117 U/L   Total Bilirubin 0.4  0.3 - 1.2 mg/dL   GFR calc non Af Amer >90  >90 mL/min   GFR calc Af  Amer >90  >90 mL/min  PROTIME-INR      Component Value Range   Prothrombin Time 12.7  11.6 - 15.2 seconds   INR 0.93  0.00 - 1.49  APTT      Component Value Range   aPTT 25  24 - 37 seconds  POCT I-STAT TROPONIN I      Component Value Range   Troponin i, poc 0.00  0.00 - 0.08 ng/mL   Comment 3           POCT I-STAT TROPONIN I      Component Value Range   Troponin i, poc 0.00  0.00 - 0.08 ng/mL   Comment 3            Dg Chest Portable 1 View  02/27/2012  *RADIOLOGY REPORT*  Clinical Data: Left chest pain  PORTABLE CHEST - 1 VIEW  Comparison: 10/02/2011  Findings: Coronary bypass changes noted.  Decreased lung volumes without CHF, pneumonia, collapse, consolidation, effusion, or pneumothorax.  Stable exam.  IMPRESSION: Low volume exam.  No acute chest process   Original Report Authenticated By: Judie Petit. TREVOR SHICK, M.D.     Glucose elevated at 291, not in DKA.  Initial 2 TNI's both negative. Will call to have pt admitted for chest pain.    11:40 AM [facility]TSB will admit pt to a telemetry unit.   1. Chest pain   2. Diabetes mellitus   3. CAD (coronary artery disease) of artery bypass graft   4. H/O: CVA (cerebrovascular accident)   5. HTN (hypertension)   6. Hyperlipidemia           Carleene Cooper III, MD 02/29/12 [id number]

## 2012-02-27 NOTE — ED Notes (Signed)
Per EMS pt woke 2 hours ago with left chest pain with rads to the left arm with left arm numbness, ASA 324 and NTG times 2 pain now 4/10

## 2012-02-27 NOTE — H&P (Signed)
Internal Medicine Teaching Service Attending H and P. Dr.Tuesday Terlecki. I have gone through the H and P by Resident and agree with the assessment and findings. In brief: 56 year old man with very  poorly controlled risk factors presents with what looks like stable angina.He has not been taking Insulin from past one month and also is not compliant with his diet.He feels SOB on climbing one flight of stairs. 9 point Review of systems was negative otherwise. Family history social history occupational history medications were reviewed. Labs reviewed and noted for increased glucose and troponin WNL X2. A and P: most likely stable angina. However if patient exhibits recurrent pain his CHADS 2 score is high. His risk factors have not been well controlled mainly due to medication and diet non compliance.agree with cardiology consult and most likely will recommend aggressive medical management.  DM: continue the current medications and needs intensive councelling for better control.  HTN: he is on excellent regimen continue the same. Did not tolerate beta blocker in the past secondary to bradycardia. But given his CAD he beta blocker would de fn provide mortality benefit. would he be a candidate for a pacemaker and a beta blocker?  Dyslipidemia: he needs a high does statin.  Rest as per resident documentation.

## 2012-02-27 NOTE — H&P (Signed)
Hospital Admission Note Date: 02/27/2012  Patient name: Shawn Hood Medical record number: [id number] Date of birth: [birth year] Age: 56 y.o. Gender: male PCP: Dr Lawerance Bach [facility])   Medical Service: Internal Medicine Teaching Service   Attending physician: Dr Lonzo Cloud     1st Contact: Dr Earlene Plater   Pager: [phone] 2nd Contact:  Dr Dierdre Searles    Pager: [phone]  After 5 pm or weekends: 1st Contact:      Pager: [phone] 2nd Contact:      Pager: [phone]  Chief Complaint: chest pain   History of Present Illness: This is a 56 year old male with PMH significant for CAD s/p CABG 2002,  Diabetes Mellitus Type 2 ( Hgb A1c 12.1 09/2011), Hypertension, CVA and  Hyperlipidemia who presented to the ED with chest pain. He reports that he woke up this morning at 6 am with chest pain, pressure like in characte , 7/10 in severity , located on the  left sided area and  Moved down his left arm where he had a feeling of numbness . The pain was associated with   some nausea and dizziness although his dizziness on a regular basis. No aggravating or alleviating factors. He took aspirin this morning but the pain did not improve. Therefore he called EMS. On his way to the ED he received Aspirin and Nitroglycerin which did improve his symptoms. He noted that he has experienced the pain in the past  When he had an MI in 2002 and in April of 2013. Denies any  recent travel, surgeries or  immobilization.  Patient noted he has been experiencing some dizziness which has been present for some time. He can not recall when everything started. It is mostly when he gets up to fast. It is a feeling of light headedness.     Meds: Current Outpatient Rx  Name Route Sig Dispense Refill  . ASPIRIN EC 325 MG PO TBEC Oral Take 325 mg by mouth daily.    Marland Kitchen CLOPIDOGREL BISULFATE 75 MG PO TABS Oral Take 75 mg by mouth daily.    . INSULIN ASPART 100 UNIT/ML [place] SOLN Subcutaneous Inject 20 Units into the skin 2 (two) times daily with breakfast and  lunch.    . INSULIN GLARGINE 100 UNIT/ML Marie SOLN Subcutaneous Inject 50 Units into the skin 2 (two) times daily.    . ISOSORBIDE MONONITRATE ER 30 MG PO TB24 Oral Take 30 mg by mouth daily.    Marland Kitchen METFORMIN HCL PO Oral Take by mouth 2 (two) times daily with a meal.    . QUINAPRIL HCL 20 MG PO TABS Oral Take 20 mg by mouth at bedtime.      Allergies: Allergies as of 02/27/2012  . (No Known Allergies)   Past Medical History  Diagnosis Date  . Diabetes mellitus   . Hypertension   . Coronary artery disease   . Hyperlipidemia   . CVA (cerebral infarction)     less than 5 years ago  . Angina   . Stroke     per patient  . Shortness of breath    Past Surgical History  Procedure Date  . Coronary artery bypass graft   . Shoulder surgery   . Cholecystectomy    History reviewed. No pertinent family history. History   Social History  . Marital Status: Married    Spouse Name: N/A    Number of Children: N/A  . Years of Education: N/A   Occupational History  . Not on file.  Social History Main Topics  . Smoking status: Former Smoker -- 1.5 packs/day for 10 years    Types: Cigarettes  . Smokeless tobacco: Never Used   Comment: Quit 1993  . Alcohol Use: No  . Drug Use: No  . Sexually Active: Yes   Other Topics Concern  . 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  Mother died at 30 years old of a heart attack, Father is living in his late 3s with no CAD. 1 Brother died with CAD but had multiple other medical problems.    Review of Systems: Bold if positive  Constitutional:  fever, chills, diaphoresis, appetite change and fatigue.  Respiratory:  SOB, DOE, cough, chest tightness,  and wheezing.   Cardiovascular: chest pain, palpitations and leg swelling.  Gastrointestinal:  nausea, vomiting, abdominal pain, diarrhea, constipation, blood in stool and abdominal distention.  Genitourinary:  dysuria, urgency,  frequency, hematuria, flank pain and difficulty urinating.  Skin:  pallor, rash and wound.  Neurological:  dizziness, , syncope, weakness, light-headedness, numbness and headaches.   Physical Exam: Blood pressure 125/70, pulse 66, resp. rate 22, height 5\' 11"  (1.803 m), weight 220 lb (99.791 kg), SpO2 100.00%. Constitutional: Vital signs reviewed.  Patient is a well-developed and well-nourished  in no acute distress and cooperative with exam. Alert and oriented x3.  Mouth: Poor dentition. No erythema or exudates, MMM Eyes: PERRL, EOMI, conjunctivae normal, No scleral icterus.  Neck: Supple,  Cardiovascular: RRR, S1 normal, S2 normal, no MRG, pulses symmetric and intact bilaterally Pulmonary/Chest: CTAB, no wheezes, rales, or rhonchi Abdominal: Soft. Non-tender, non-distended, bowel sounds are normal, no masses, organomegaly, or guarding present.  Hematology: no cervicaladenopathy.  Neurological: A&O x3, Strenght is 5/5 in all extremities except in LUE 4/5, cranial nerve II-XII are grossly intact,  sensory intact to light touch bilaterally except decreased in the LLE.  Skin: Warm, dry and intact. No rash, cyanosis, or clubbing.   Lab results: Basic Metabolic Panel:  [facility] 02/27/12 0831  NA 136  K 3.8  CL 102  CO2 27  GLUCOSE 291*  BUN 13  CREATININE 0.87  CALCIUM 9.0  MG --  PHOS --   Liver Function Tests:  [facility] 02/27/12 0831  AST 14  ALT 10  ALKPHOS 93  BILITOT 0.4  PROT 7.0  ALBUMIN 3.3*    CBC:  Basename 02/27/12 0831  WBC 4.9  NEUTROABS --  HGB 13.1  HCT 38.1*  MCV 81.6  PLT 205   Coagulation:  Basename 02/27/12 0831  LABPROT 12.7  INR 0.93    Imaging results:  Dg Chest Portable 1 View  02/27/2012  *RADIOLOGY REPORT*  Clinical Data: Left chest pain  PORTABLE CHEST - 1 VIEW  Comparison: 10/02/2011  Findings: Coronary bypass changes noted.  Decreased lung volumes without CHF, pneumonia, collapse, consolidation, effusion, or pneumothorax.  Stable  exam.  IMPRESSION: Low volume exam.  No acute chest process   Original Report Authenticated By: Judie Petit. Ruel Favors, M.D.     Assessment & Plan by Problem: 1. Chest pain likely stable angina: Patient has a known history of CAD, s/p CABG 2002, s/p negative stress myoview 03/2010, s/p NSTEMI 09/2010. Cardiac cath at that time showed severe 2-vessel disease, with patent grafts, chronically occluded OM-2/OM-1/Diagonal and distal LAD disease, not amenable to PTCA.  2D Echocardiogram of 10/03/11, showed normal left ventricular cavity size was normal, mild LVH. Ejection fraction was in the range of  50% to 55% and there were no regional wall motion abnormalities. Aggressive medical management was recommended at that time. Beta-blocker could not be used due to episodes of Bradycardia. Two sets of Troponin negative x2. No acute ischemic changes on ECG.   Other DD include PE although unlikely with Geneva Score of 0 ( low Probability ). Chest xray did not show any acute process including Pneumothorax, Aortic dissection or Pneumonia. GERD is a possibility since patient has history of ingestion. TSH was within normal limits on 09/2011 - Will admit to telemetry  - Will cycle cardiac enzymes - Will check cardiac enzymes - Consider to consult Cardiology  2. Diabetes Mellitus, Type 2. Poorly controlled with Hgb A1c of 12.1 . Currently on Lantus 50 units bid, Metformin and Novolog 20 units bid with food.  - Will check Hgb A1c - Will hold Metformin - Will continue Lantus and Novolog - SSI sensitive   3. Hypertension : well controlled.  - Will continue Imdur and quinapril 20 mg daily   4. HLD: Lipid panel in 09/2011 Cholesterol 239, Triglyceride 68, LDL 189, HDL 36. Patient had refused statin in the past . He wanted to control it with diet - Will check lipid panel today - Will start Lipitor 40 mg daily  5. DVT ppx: Lovenox    Signed: Advay Volante 02/27/2012, 12:11 PM

## 2012-02-28 ENCOUNTER — Encounter (HOSPITAL_COMMUNITY): Payer: Self-pay | Admitting: Physician Assistant

## 2012-02-28 DIAGNOSIS — Z8673 Personal history of transient ischemic attack (TIA), and cerebral infarction without residual deficits: Secondary | ICD-10-CM

## 2012-02-28 DIAGNOSIS — R079 Chest pain, unspecified: Secondary | ICD-10-CM

## 2012-02-28 DIAGNOSIS — I10 Essential (primary) hypertension: Secondary | ICD-10-CM

## 2012-02-28 DIAGNOSIS — E785 Hyperlipidemia, unspecified: Secondary | ICD-10-CM

## 2012-02-28 LAB — BASIC METABOLIC PANEL
BUN: 12 mg/dL (ref 6–23)
CO2: 29 mEq/L (ref 19–32)
Chloride: 102 mEq/L (ref 96–112)
Creatinine, Ser: 0.87 mg/dL (ref 0.50–1.35)
GFR calc Af Amer: >90 mL/min (ref >90)
Potassium: 3.2 mEq/L — ABNORMAL LOW (ref 3.5–5.1)

## 2012-02-28 LAB — HEMOGLOBIN A1C
Hgb A1c MFr Bld: 14.2 % — ABNORMAL HIGH (ref <5.7)
Mean Plasma Glucose: 361 mg/dL — ABNORMAL HIGH (ref <117)

## 2012-02-28 LAB — LIPID PANEL
HDL: 41 mg/dL (ref >39)
Total CHOL/HDL Ratio: 5 RATIO
Triglycerides: 70 mg/dL (ref <150)

## 2012-02-28 LAB — GLUCOSE, CAPILLARY
Glucose-Capillary: 80 mg/dL (ref 70–99)
Glucose-Capillary: 135 mg/dL — ABNORMAL HIGH (ref 70–99)

## 2012-02-28 LAB — TROPONIN I: Troponin I: <0.3 ng/mL (ref <0.30)

## 2012-02-28 MED ORDER — INSULIN ASPART 100 UNIT/ML ~~LOC~~ SOLN: 10.0000 [IU] | Freq: Every day | SUBCUTANEOUS | Status: DC

## 2012-02-28 MED ORDER — POTASSIUM CHLORIDE CRYS ER 20 MEQ PO TBCR
40.0000 meq | EXTENDED_RELEASE_TABLET | ORAL | Status: AC
  Administered 2012-02-28 (×3): 40 meq via ORAL
  Filled 2012-02-28 (×2): qty 2

## 2012-02-28 MED ORDER — HEPARIN (PORCINE) IN NACL 100-0.45 UNIT/ML-% IJ SOLN
1250.0000 [IU]/h | INTRAMUSCULAR | Status: DC
  Administered 2012-02-28: 1250 [IU]/h via INTRAVENOUS
  Filled 2012-02-28 (×2): qty 250

## 2012-02-28 MED ORDER — INSULIN ASPART 100 UNIT/ML ~~LOC~~ SOLN: 20.0000 [IU] | Freq: Two times a day (BID) | SUBCUTANEOUS | Status: DC

## 2012-02-28 MED ORDER — INSULIN GLARGINE 100 UNIT/ML ~~LOC~~ SOLN: 25.0000 [IU] | Freq: Every day | SUBCUTANEOUS | Status: DC

## 2012-02-28 MED ORDER — INSULIN ASPART 100 UNIT/ML ~~LOC~~ SOLN
0.0000 [IU] | Freq: Three times a day (TID) | SUBCUTANEOUS | Status: DC
  Administered 2012-02-29: 18:00:00 3 [IU] via SUBCUTANEOUS

## 2012-02-28 MED ORDER — INSULIN GLARGINE 100 UNIT/ML ~~LOC~~ SOLN: 50.0000 [IU] | Freq: Every day | SUBCUTANEOUS | Status: DC

## 2012-02-28 MED ORDER — INSULIN ASPART 100 UNIT/ML ~~LOC~~ SOLN: 20.0000 [IU] | Freq: Three times a day (TID) | SUBCUTANEOUS | Status: DC

## 2012-02-28 MED ORDER — INSULIN GLARGINE 100 UNIT/ML ~~LOC~~ SOLN
20.0000 [IU] | Freq: Every day | SUBCUTANEOUS | Status: DC
  Administered 2012-02-28 – 2012-02-29 (×2): 20 [IU] via SUBCUTANEOUS

## 2012-02-28 MED ORDER — HEPARIN BOLUS VIA INFUSION
4000.0000 [IU] | Freq: Once | INTRAVENOUS | Status: AC
  Administered 2012-02-28: 4000 [IU] via INTRAVENOUS
  Filled 2012-02-28: qty 4000

## 2012-02-28 MED ORDER — ATORVASTATIN CALCIUM 40 MG PO TABS: 40.0000 mg | ORAL_TABLET | Freq: Every day | ORAL | Status: AC

## 2012-02-28 NOTE — Progress Notes (Signed)
Patient admitted with CP.  Has significant history of diabetes.  Sees Dr. Lawerance Bach in [place] for diabetes management.  A1c this admission was 14.2% (02/27/12).    Spoke with patient about his A1c.  Explained what an A1c measures and the significance of an A1c.  Patient told me his last A1c drawn at his PCP's office was around 12%.  Reminded patient about the importance of CBG checks and the importance of controlling CBGs to prevent long-term complications.    Upon further investigation, patient told me his PCP (at his last visit about 3 months ago) had stopped his Novolog 20 units bid and increased his Lantus home dose to 50 units bid (was previously on Lantus 30 units bid + the Novolog 20 units bid).  Patient told me he gets his Lantus through [facility] medical services and that he does not have trouble affording it.  Patient told me there was a mix up with the mailing of his Lantus, and this is why he was not able to take it for approximately 1 month.  Encouraged patient to check his CBGs at least bid at home and to record all the results for his PCP.  Patient agreeable and willing to do this.  Will follow. Ambrose Finland RN, MSN, CDE Diabetes Coordinator Inpatient Diabetes Program [phone]

## 2012-02-28 NOTE — Progress Notes (Signed)
UR Completed Kazim Corrales Graves-Bigelow, RN,BSN [phone]  

## 2012-02-28 NOTE — Progress Notes (Signed)
Internal Medicine teaching attending Dr.Alicia Ackert. I personally evaluated the patient today. I have discussed the patient care with the resident team. Patient states that he was seen by cardiology ( notes still not in the system) and they suggested aggressive medical management. Plan to continue addressing hid DM Lipids for management of his severe CAD with symptoms of angina. He can be discharged home with follow up by his PCP for his multiple problems. Of note patient states today that he does not remember denying to take statin for his Hypercholesterolemia.

## 2012-02-28 NOTE — Progress Notes (Signed)
ANTICOAGULATION CONSLT NOTE - Initial Consult  Pharmacy Consult for Heparin Indication: chest pain/ACS  No Known Allergies  Patient Measurements: Height: 5\' 11"  (180.3 cm) Weight: 211 lb 12.8 oz (96.072 kg) IBW/kg (Calculated) : 75.3  Heparin Dosing Weight: 94.71                     Vital Signs: Temp: 97.8 F (36.6 C) (09/05 1515) Temp src: Oral (09/05 0515) BP: 138/69 mmHg (09/05 1515) Pulse Rate: 73  (09/05 1515)  Labs:  Basename 02/28/12 0500 02/27/12 2216 02/27/12 1558 02/27/12 0831  HGB -- -- -- 13.1  HCT -- -- -- 38.1*  PLT -- -- -- 205  APTT -- -- -- 25  LABPROT -- -- -- 12.7  INR -- -- -- 0.93  HEPARINUNFRC -- -- -- --  CREATININE 0.87 -- -- 0.87  CKTOTAL -- 73 73 --  CKMB -- 1.5 1.7 --  TROPONINI -- <0.30 <0.30 --    Estimated Creatinine Clearance: 114.8 ml/min (by C-G formula based on Cr of 0.87).   Medical History: Past Medical History  Diagnosis Date  . Diabetes mellitus   . Hypertension   . Coronary artery disease     a. s/p CABG 2002. b. NSTEMI 09/2010 with patent grafts, no obvious culprit, for med rx.  . Hyperlipidemia   . 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  . Sinus bradycardia     Assessment: 56 y.o. M who presented to [facility] on 9/4 with c/o CP. The patient has cardiac history significant for CAD s/p CABG '02, HTN, and DL. Cardiac enzymes thus far have been negative. Cardiology has been consulted and have asked pharmacy to aid with heparin dosing while undergoing further cardiac evaluation. The patient received one dose of Lovenox 40 mg SQ at 1725 on 9/4.  Heparin Wt~94.7 kg, Hgb/Hct/Plt ok. No s/sx of bleeding noted at baseline. The patient does have a history of stroke, however it was over a year ago in 09/2010.   Goal of Therapy:  Heparin level 0.3-0.7 units/ml Monitor platelets by anticoagulation protocol: Yes   Plan:  1. Heparin bolus of 4000 units x 1 2. Initiate heparin drip at rate of 1250  units/hr (12.5 ml/hr) 3. Daily heparin levels 4. Will continue to monitor for any signs/symptoms of bleeding and will follow up with heparin level in 6 hours   Georgina Pillion, PharmD, BCPS Clinical Pharmacist Pager: [phone] 02/28/2012 4:22 PM

## 2012-02-28 NOTE — Consult Note (Signed)
CARDIOLOGY CONSULT NOTE  Patient ID: Eaven Schwager, MRN: [id number], DOB/AGE: 1956-02-06 56 y.o. Admit date: 02/27/2012   Date of Consult: 02/28/2012  Primary Cardiologist: [facility] - Dr Gwen Pounds remotely but has not seen him in several years. Has not been seen in our office recently, but has been intermittently seen in consult in the hospital.  Chief Complaint: chest pain  HPI: Mr. Cosman is a 56 y/o M with hx of CAD s/p CABG 2002, uncontrolled HTN, HL not wanting to take statins who presented to [facility] yesterday with complaints of chest pain worrisome for [place]. He was awakened from sleep at 6am with chest pressure/burning and pain down his left arm associated with nausea. This reminded him of his prior angina. Wife gave pt ASA. No diaphoresis, lightheadedness, or palpitations. After 15-20 minutes he called EMS and was given 4 baby ASA then 2 SL NTG which helped to resolve the pain. He has been pain free since arrival, but during our interview the patient noted diaphoresis, flushed with sense of increased palpitations. He denied CP or SOB but felt like his heart was beating harder. He was NSR with HR 70s-80s on telemetry. Vitals were stable with BP in 130's. CBG was 80 which per nursing is low for him (average CBG 361 by A1C). 12 lead EKG was done urgently showing anterolateral ST-T changes extending to V6 which appeared new.  A follow-up EKG was obtained when the patient's symptoms resolved (only received orange juice), and appeared similar to admission EKG. (His admission EKG showed NSR with nonspecific T wave inversion avL, V2.) CE's neg so far. No recent exertional CP.  He reports running out of his insulin 1 month ago and was not able to afford more. He says he has it straightened out now and will be able to get his insulin from now on. HgbA1C was in 12 range in 09/2011.   Past Medical History  Diagnosis Date  . Diabetes mellitus   . Hypertension   . Coronary artery disease    a. s/p CABG 2002. b. NSTEMI 09/2010 with patent grafts, no obvious culprit, for med rx.  . Hyperlipidemia   . 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  . Sinus bradycardia       Most Recent Cardiac Studies: Cardiac Cath 09/2010 STUDY CONCLUSIONS:  1. Severe two-vessel coronary artery disease with patent grafts.  2. Chronically occluded OM-2 and possibly OM-1 as well as diagonal branches, which seems to be supplied by good collaterals.  3. Distal LAD disease close to the apex, which appears to be chronic and not approachable for percutaneous angioplasty.  4. No culprit was identified for a small non-ST elevation myocardial infarction.  5. Normal LV systolic function with only mildly elevated left ventricular end-diastolic pressure.  RECOMMENDATIONS: I recommend continuing aggressive medical therapy. No revascularization is advised.  2D Echo 09/2011 Study Conclusions - Left ventricle: The cavity size was normal. Wall thickness was increased in a pattern of mild LVH. Systolic function was normal. The estimated ejection fraction was in the range of 50% to 55%. Wall motion was normal; there were no regional wall motion abnormalities. - Mitral valve: Mild regurgitation. - Left atrium: The atrium was mildly dilated. - Atrial septum: There was redundancy of the septum, with borderline criteria for aneurysm    Surgical History:  Past Surgical History  Procedure Date  . Coronary artery bypass graft 2002  . Shoulder surgery   .  Cholecystectomy      Home Meds: Prior to Admission medications   Medication Sig Start Date End Date Taking? Authorizing Provider  aspirin EC 325 MG tablet Take 325 mg by mouth daily.   Yes Historical Provider, MD  clopidogrel (PLAVIX) 75 MG tablet Take 75 mg by mouth daily.   Yes Historical Provider, MD  insulin aspart (NOVOLOG) 100 UNIT/ML injection Inject 20 Units into the skin 2 (two) times daily with breakfast and lunch.   Yes  Historical Provider, MD  insulin glargine (LANTUS) 100 UNIT/ML injection Inject 50 Units into the skin 2 (two) times daily. 10/04/11  Yes Laveda Norman, MD  isosorbide mononitrate (IMDUR) 30 MG 24 hr tablet Take 30 mg by mouth daily. 10/04/11 10/03/12 Yes Laveda Norman, MD  METFORMIN HCL PO Take by mouth 2 (two) times daily with a meal.   Yes Historical Provider, MD  quinapril (ACCUPRIL) 20 MG tablet Take 20 mg by mouth at bedtime.   Yes Historical Provider, MD    Inpatient Medications:     . aspirin EC  325 mg Oral Daily  . atorvastatin  40 mg Oral q1800  . clopidogrel  75 mg Oral QAC breakfast  . enoxaparin (LOVENOX) injection  40 mg Subcutaneous Q24H  . insulin aspart  0-15 Units Subcutaneous TID WC  . insulin aspart  0-5 Units Subcutaneous QHS  . insulin aspart  20 Units Subcutaneous TID WC  . insulin glargine  50 Units Subcutaneous BID  . isosorbide mononitrate  30 mg Oral Daily  . lisinopril  20 mg Oral QHS  . potassium chloride  40 mEq Oral Q4H  . sodium chloride  3 mL Intravenous Q12H  . DISCONTD: insulin aspart  0-9 Units Subcutaneous TID WC  . DISCONTD: insulin aspart  20 Units Subcutaneous BID WC  . DISCONTD: potassium chloride  40 mEq Oral Once  . DISCONTD: quinapril  20 mg Oral QHS    Allergies: No Known Allergies  History   Social History  . Marital Status: Married    Spouse Name: N/A    Number of Children: N/A  . Years of Education: N/A   Occupational History  . Not on file.   Social History Main Topics  . Smoking status: Former Smoker -- 1.5 packs/day for 10 years    Types: Cigarettes  . Smokeless tobacco: Never Used   Comment: Quit 1993  . Alcohol Use: No  . Drug Use: No  . Sexually Active: Yes   Other Topics Concern  . 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  Mother died at 60 years old of a heart attack, Father is living in his late 28s with no CAD. 1 Brother died with CAD but had  multiple other medical problems.     Family History  Problem Relation Age of Onset  . Coronary artery disease Mother     Died at 12 of MI  . Coronary artery disease Brother     Died of CAD but had other medical problems     Review of Systems: General: negative for chills, fever, night sweats or weight changes.  Cardiovascular: see above Dermatological: negative for rash Respiratory: negative for cough or wheezing Urologic: negative for hematuria Abdominal: negative for nausea, vomiting, diarrhea, bright red blood per rectum, melena, or hematemesis Neurologic: negative for visual changes, syncope, or dizziness All other systems reviewed and are otherwise negative except as noted above.  Labs:  [facility] 02/27/12 2216 02/27/12 1558  CKTOTAL 73 73  CKMB 1.5 1.7  TROPONINI <0.30 <0.30   Lab Results  Component Value Date   WBC 4.9 02/27/2012   HGB 13.1 02/27/2012   HCT 38.1* 02/27/2012   MCV 81.6 02/27/2012   PLT 205 02/27/2012     Lab 02/28/12 0500 02/27/12 0831  NA 139 --  K 3.2* --  CL 102 --  CO2 29 --  BUN 12 --  CREATININE 0.87 --  CALCIUM 9.3 --  PROT -- 7.0  BILITOT -- 0.4  ALKPHOS -- 93  ALT -- 10  AST -- 14  GLUCOSE 188* --   Lab Results  Component Value Date   CHOL 207* 02/28/2012   HDL 41 02/28/2012   LDLCALC 161* 02/28/2012   TRIG 70 02/28/2012   Radiology/Studies:  Dg Chest Portable 1 View 02/27/2012  *RADIOLOGY REPORT*  Clinical Data: Left chest pain  PORTABLE CHEST - 1 VIEW  Comparison: 10/02/2011  Findings: Coronary bypass changes noted.  Decreased lung volumes without CHF, pneumonia, collapse, consolidation, effusion, or pneumothorax.  Stable exam.  IMPRESSION: Low volume exam.  No acute chest process   Original Report Authenticated By: Judie Petit. Ruel Favors, M.D.    EKG:  Admit EKG: NSR at 65 bpm, non-specific t wave changes in avL, V1, V2 Symptoms: 12 lead EKG was done urgently during diaphoresis, "heart pounding" showing NSR  anterolateral ST-T changes extending  to V6 which appeared new.  A follow-up EKG was obtained when the patient's symptoms resolved, and appeared similar to admission EKG.  Physical Exam: Blood pressure 108/68, pulse 61, temperature 98.6 F (37 C), temperature source Oral, resp. rate 20, height 5\' 11"  (1.803 m), weight 211 lb 12.8 oz (96.072 kg), SpO2 98.00%. General: Well developed, well nourished AAM in no acute distress but diaphoretic on his back. Head: Normocephalic, atraumatic, sclera non-icteric, no xanthomas, nares are without discharge.  Neck: left carotid bruit, no bruit on R. JVD not elevated. Lungs: Clear bilaterally to auscultation without wheezes, rales, or rhonchi. Breathing is unlabored. Heart: RRR with S1 S2. No murmurs, rubs, or gallops appreciated. Abdomen: Soft, non-tender, non-distended with normoactive bowel sounds. No hepatomegaly. No rebound/guarding. No obvious abdominal masses. Msk:  Strength and tone appear normal for age. Extremities: No clubbing or cyanosis. No edema.  Distal pedal pulses are 2+ and equal bilaterally. Neuro: Alert and oriented X 3. Moves all extremities spontaneously. Psych:  Responds to questions appropriately with a normal affect.   Assessment and Plan:  1. CAD s/p CABG with chest pain concerning for [place] - Negative troponin x2 thus far but dynamic EKG changes in setting of diaphoresis this afternoon worrisome for ischemia. Initiate IV heparin per pharmacy. Continue ASA, Plavix, statin, Imdur. No BB with history of bradycardia. Would also not want to mask any hypoglycemic symptoms. Plan cardiac cath tomorrow afternoon (will allow to have breakfast). Would give 1/2 dose Novolog/Lantus in AM and hold lunch Novolog. If he has recurrent episode of chest discomfort, would transfer to stepdown unit. 2. Uncontrolled diabetes - patient would benefit from continued education on importance of compliance. Perhaps less expensive insulin is a better option. Diaphoretic episode this afternoon may have  been due to relative hypoglycemia (CBG 80 in the setting of A1C of 14) but also had EKG changes so wondering if ischemic in nature. See above re: temporary insulin changes due to having to be NPO after breakfast tomorrow for cath. 3. Hyperlipidemia - in past refused statin therapy in  favor of controlling cholesterol with diet. Agree with starting statin therapy. 4. Sinus bradycardia - no significant heart block or pauses. HR generally 60s-70s with rare AM dipping into 40s. No BB due to this. 5. H/o CVA - stable. 6. L carotid bruit - recommend OP carotid dopplers.  Signed, Dayna Dunn PA-C 02/28/2012, 2:51 PM   History and all data above reviewed.  Patient examined.  I agree with the findings as above.  The patient presented with chest pain waking him from his sleep.  This is similar to previous "heart" pain.  He had a second episode in the hospital with some subtle T wave changes as described above.  He has very poorly controlled risk factors.  The patient exam reveals COR:RRR  ,  Lungs: Clear  ,  Abd: Positive bowel sounds, no rebound no guarding, Ext No edema  .  All available labs, radiology testing, previous records reviewed. Agree with documented assessment and plan. Given these symptoms with subtle dynamic EKG changes and previous history the possibility of obstructive CAD as a risk factor is high.  He will need cardiac cath.  The patient understands that risks included but are not limited to stroke (1 in 1000), death (1 in 1000), kidney failure [usually temporary] (1 in 500), bleeding (1 in 200), allergic reaction [possibly serious] (1 in 200).  The patient understands and agrees to proceed in the am.  [name]  6:44 PM  02/28/2012

## 2012-02-28 NOTE — Progress Notes (Signed)
Subjective:    Interval Events:  Chest pain has resolved.  No complaints this AM.  Denies headache, dizziness, N/V, chest pain, dyspnea, and abdominal pain.  Discussed with pt importance of cholesterol control and diabetes control.  Congratulated him on blood pressure control and smoking cessation.  Pt says he thinks he has his finances sorted out now such that he will be able to afford insulin now.  Money has been the barrier to glycemic control heretofore.     Objective:    Vital Signs:   Temp:  [97.9 F (36.6 C)-98.3 F (36.8 C)] 97.9 F (36.6 C) (09/05 0515) Pulse Rate:  [56-70] 57  (09/05 0515) Resp:  [16-22] 20  (09/05 0515) BP: (117-150)/(68-101) 117/68 mmHg (09/05 0515) SpO2:  [96 %-100 %] 100 % (09/05 0515) Weight:  [211 lb 12.8 oz (96.072 kg)-220 lb (99.791 kg)] 211 lb 12.8 oz (96.072 kg) (09/04 1330) Last BM Date: 02/26/12   Weights: 24-hour Weight change:   Filed Weights   02/27/12 1044 02/27/12 1330  Weight: 220 lb (99.791 kg) 211 lb 12.8 oz (96.072 kg)     Intake/Output:   Intake/Output Summary (Last 24 hours) at 02/28/12 0934 Last data filed at 02/27/12 1206  Gross per 24 hour  Intake      0 ml  Output   1500 ml  Net  -1500 ml       Physical Exam: General appearance: alert, cooperative and no distress Resp: clear to auscultation bilaterally Cardio: regular rate and rhythm, S1, S2 normal, no click, no rub and 26 systolic murmur GI: soft, non-tender; bowel sounds normal; no masses,  no organomegaly    Labs: Basic Metabolic Panel:  Lab 02/28/12 7829 02/27/12 1558 02/27/12 0831  NA 139 -- 136  K 3.2* -- 3.8  CL 102 -- 102  CO2 29 -- 27  GLUCOSE 188* -- 291*  BUN 12 -- 13  CREATININE 0.87 -- 0.87  CALCIUM 9.3 -- 9.0  MG -- 1.7 --  PHOS -- -- --   Cardiac Enzymes:  Lab 02/27/12 2216 02/27/12 1558  CKTOTAL 73 73  CKMB 1.5 1.7  CKMBINDEX -- --  TROPONINI <0.30 <0.30   CBG:  Lab 02/28/12 0735 02/27/12 2113 02/27/12 1609 02/27/12 1311   GLUCAP 129* 291* 363* 273*   Coagulation Studies:  Basename 02/27/12 0831  LABPROT 12.7  INR 0.93   Microbiology: Results for orders placed during the hospital encounter of 10/03/10  MRSA PCR SCREENING     Status: Normal   Collection Time   10/04/10  1:29 AM      Component Value Range Status Comment   MRSA by PCR    NEGATIVE Final    Value: NEGATIVE            The GeneXpert MRSA Assay (FDA     approved for NASAL specimens     only), is one component of a     comprehensive MRSA colonization     surveillance program. It is not     intended to diagnose MRSA     infection nor to guide or     monitor treatment for     MRSA infections.   Other results: EKG Results: 02/27/2012 Rate:  65 PR:  168 QRS:  96 QTc:  391 EKG: normal EKG, normal sinus rhythm, unchanged from previous tracings.  Imaging: Dg Chest Portable 1 View 02/27/2012  IMPRESSION: Low volume exam.  No acute chest process   Original Report Authenticated By: Judie Petit. Shawn  Miles Hood, M.D.      Medications:    Infusions:    . DISCONTD: sodium chloride     Scheduled Medications:    . aspirin EC  325 mg Oral Daily  . atorvastatin  40 mg Oral q1800  . clopidogrel  75 mg Oral QAC breakfast  . enoxaparin (LOVENOX) injection  40 mg Subcutaneous Q24H  . insulin aspart  0-5 Units Subcutaneous QHS  . insulin aspart  0-9 Units Subcutaneous TID WC  . insulin aspart  10 Units Intravenous Once  . insulin aspart  20 Units Subcutaneous BID WC  . insulin glargine  50 Units Subcutaneous BID  . isosorbide mononitrate  30 mg Oral Daily  . lisinopril  20 mg Oral QHS  . potassium chloride  40 mEq Oral Q4H  . sodium chloride  3 mL Intravenous Q12H  . DISCONTD: potassium chloride  40 mEq Oral Once  . DISCONTD: quinapril  20 mg Oral QHS     PRN Medications: morphine injection, nitroGLYCERIN   Assessment/ Plan:    1.   Chest pain:  Most likely anginal.  Uncontrolled risk factor and previous history support this.  Patient had a  CABG in 2002, a negative stress myoview 2011, and a NSTEMI in April 2012. Cardiac cath at that time showed severe 2-vessel disease, with patent grafts, chronically occluded OM-2/OM-1/Diagonal and distal LAD disease, not amenable to PTCA. 2D Echocardiogram in April 2013 showed normal left ventricular cavity size, mild LVH, and ejection fraction of 50% to 55%. There were no regional wall motion abnormalities. Aggressive medical management was recommended at that time. Beta-blocker could not be used due to bradycardia.  Troponins and EKG do not suggest acute ischemia this time.  PE unlikely (Geneve Score 0).  Indigestion is another possibility.  Normal CXR rules out other pulmonary/pleural processes. - Continue aspirin and clopidogrel - Continue atorvastatin 40mg  daily - AM EKG pending - Consult cardiology  2.   Type 2 diabetes mellitus:  Poorly controlled; A1c 12.1.  Has not been taking insulin because of financial difficulties. - Consult DM educator - Consult social work - Metformin held - qHS correctional insulin - Sensitive qAC correctional insulin - 20U insulin aspart with breakfast and lunch - 50U insulin glargine BID  3.   Hypertension:  Well controlled. - Continue lisinopril 20mg  here (quinapril at home) - Continue isosorbide mononitrate 30mg   4.   Hypokalemia:  K low to 3.2 this AM.  Will replace orally. - KCl 40 mEq x3 today  5.   Hyperlipidemia:  Attempt at dietary control has failed.  LDL 152 here.  Will start statin. - atorvastatin 40mg  daily  6.   Prophylaxis:  Enoxaparin  7.   Disposition:  Pending work up of chest pain.   Length of Stay: 1 days   Signed by:  Dorthula Rue. Earlene Plater, MD PGY-I, Internal Medicine Pager [phone]  02/28/2012, 9:34 AM

## 2012-02-28 NOTE — Care Management Note (Unsigned)
    Page 1 of 1   02/28/2012     4:59:48 PM   CARE MANAGEMENT NOTE 02/28/2012  Patient:  Shawn Hood, Shawn Hood   Account Number:  [id number]  Date Initiated:  02/28/2012  Documentation initiated by:  GRAVES-BIGELOW,Jeremaine Maraj  Subjective/Objective Assessment:   Pt admitted with cp. Initiated iv heparin gtt.     Action/Plan:   CM will continue to monitor for disposition needs. Diabetes Coordinator did speak with pt in concerns with insulin. Not a matter of cost.   Anticipated DC Date:  02/29/2012   Anticipated DC Plan:  HOME/SELF CARE      DC Planning Services  CM consult      Choice offered to / List presented to:             Status of service:   Medicare Important Message given?   (If response is "NO", the following Medicare IM given date fields will be blank) Date Medicare IM given:   Date Additional Medicare IM given:    Discharge Disposition:    Per UR Regulation:  Reviewed for med. necessity/level of care/duration of stay  If discussed at Long Length of Stay Meetings, dates discussed:    Comments:

## 2012-02-29 ENCOUNTER — Encounter (HOSPITAL_COMMUNITY)
Admission: EM | Disposition: A | Discharge status: Home / Self Care | Payer: Self-pay | Source: Home / Self Care | Attending: Internal Medicine

## 2012-02-29 DIAGNOSIS — I25810 Atherosclerosis of coronary artery bypass graft(s) without angina pectoris: Secondary | ICD-10-CM

## 2012-02-29 HISTORY — PX: LEFT HEART CATHETERIZATION WITH CORONARY/GRAFT ANGIOGRAM: SHX5450

## 2012-02-29 HISTORY — PX: PERCUTANEOUS CORONARY STENT INTERVENTION (PCI-S): SHX5485

## 2012-02-29 LAB — BASIC METABOLIC PANEL
BUN: 12 mg/dL (ref 6–23)
Creatinine, Ser: 0.82 mg/dL (ref 0.50–1.35)
GFR calc Af Amer: >90 mL/min (ref >90)
GFR calc non Af Amer: >90 mL/min (ref >90)
Potassium: 3.7 mEq/L (ref 3.5–5.1)

## 2012-02-29 LAB — GLUCOSE, CAPILLARY
Glucose-Capillary: 212 mg/dL — ABNORMAL HIGH (ref 70–99)
Glucose-Capillary: 167 mg/dL — ABNORMAL HIGH (ref 70–99)

## 2012-02-29 LAB — POCT ACTIVATED CLOTTING TIME: Activated Clotting Time: 134 seconds

## 2012-02-29 LAB — TROPONIN I: Troponin I: <0.3 ng/mL (ref <0.30)

## 2012-02-29 SURGERY — LEFT HEART CATHETERIZATION WITH CORONARY/GRAFT ANGIOGRAM
Anesthesia: LOCAL

## 2012-02-29 MED ORDER — LIDOCAINE HCL (PF) 1 % IJ SOLN
INTRAMUSCULAR | Status: AC
  Filled 2012-02-29: qty 30

## 2012-02-29 MED ORDER — SODIUM CHLORIDE 0.9 % IV SOLN
1.0000 mL/kg/h | INTRAVENOUS | Status: DC
  Administered 2012-02-29: 1 mL/kg/h via INTRAVENOUS

## 2012-02-29 MED ORDER — HEPARIN SODIUM (PORCINE) 1000 UNIT/ML IJ SOLN
INTRAMUSCULAR | Status: AC
  Filled 2012-02-29: qty 1

## 2012-02-29 MED ORDER — MIDAZOLAM HCL 2 MG/2ML IJ SOLN
INTRAMUSCULAR | Status: AC
  Filled 2012-02-29: qty 2

## 2012-02-29 MED ORDER — HEPARIN (PORCINE) IN NACL 2-0.9 UNIT/ML-% IJ SOLN
INTRAMUSCULAR | Status: AC
  Filled 2012-02-29: qty 2000

## 2012-02-29 MED ORDER — INSULIN GLARGINE 100 UNIT/ML ~~LOC~~ SOLN
50.0000 [IU] | Freq: Every day | SUBCUTANEOUS | Status: DC
  Administered 2012-03-01: 50 [IU] via SUBCUTANEOUS

## 2012-02-29 MED ORDER — SODIUM CHLORIDE 0.9 % IV SOLN
1.0000 mL/kg/h | INTRAVENOUS | Status: AC
  Administered 2012-02-29: 1 mL/kg/h via INTRAVENOUS

## 2012-02-29 MED ORDER — BIVALIRUDIN 250 MG IV SOLR
INTRAVENOUS | Status: AC
  Filled 2012-02-29: qty 250

## 2012-02-29 MED ORDER — NITROGLYCERIN 0.2 MG/ML ON CALL CATH LAB
INTRAVENOUS | Status: AC
  Filled 2012-02-29: qty 1

## 2012-02-29 MED ORDER — ASPIRIN 81 MG PO CHEW
324.0000 mg | CHEWABLE_TABLET | ORAL | Status: AC
  Administered 2012-02-29: 324 mg via ORAL
  Filled 2012-02-29: qty 4

## 2012-02-29 MED ORDER — FENTANYL CITRATE 0.05 MG/ML IJ SOLN
INTRAMUSCULAR | Status: AC
  Filled 2012-02-29: qty 2

## 2012-02-29 NOTE — Progress Notes (Signed)
ANTICOAGULATION CONSLT NOTE - Follow Up Consult  Pharmacy Consult for Heparin Indication: chest pain/ACS  No Known Allergies  Patient Measurements: Height: 5\' 11"  (180.3 cm) Weight: 211 lb 12.8 oz (96.072 kg) IBW/kg (Calculated) : 75.3  Heparin Dosing Weight: 94.71                     Vital Signs: Temp: 97.9 F (36.6 C) (09/05 2100) BP: 121/69 mmHg (09/05 2100) Pulse Rate: 54  (09/05 2100)  Labs:  Basename 02/28/12 2317 02/28/12 2132 02/28/12 1643 02/28/12 0500 02/27/12 2216 02/27/12 1558 02/27/12 0831  HGB -- -- -- -- -- -- 13.1  HCT -- -- -- -- -- -- 38.1*  PLT -- -- -- -- -- -- 205  APTT -- -- -- -- -- -- 25  LABPROT -- -- -- -- -- -- 12.7  INR -- -- -- -- -- -- 0.93  HEPARINUNFRC 0.41 -- -- -- -- -- --  CREATININE -- -- -- 0.87 -- -- 0.87  CKTOTAL -- -- -- -- 73 73 --  CKMB -- -- -- -- 1.5 1.7 --  TROPONINI -- <0.30 <0.30 -- <0.30 -- --    Estimated Creatinine Clearance: 114.8 ml/min (by C-G formula based on Cr of 0.87).   Medical History: Past Medical History  Diagnosis Date  . Diabetes mellitus   . Hypertension   . Coronary artery disease     a. s/p CABG 2002. b. NSTEMI 09/2010 with patent grafts, no obvious culprit, for med rx.  . Hyperlipidemia   . 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  . Sinus bradycardia     Assessment: 56 y.o. M who presented to [facility] on 9/4 with c/o CP. The patient has cardiac history significant for CAD s/p CABG '02, HTN, and DL. Cardiac enzymes thus far have been negative. Cardiology has been consulted and have asked pharmacy to aid with heparin dosing while undergoing further cardiac evaluation. Heparin level (0.41) is at-goal on 1250 units/hr.   Goal of Therapy:  Heparin level 0.3-0.7 units/ml Monitor platelets by anticoagulation protocol: Yes   Plan:  1. Continue IV heparin at 1250 units/hr.  2. Follow-up AM heparin level, CBC  Lorre Munroe, PharmD 02/29/2012 12:17 AM

## 2012-02-29 NOTE — CV Procedure (Signed)
  Cardiac Catheterization Procedure Note  Name: Shawn Hood MRN: [id number] DOB: 08-Jun-1956  Procedure: Left Heart Cath, Selective Coronary Angiography, LV angiography  Indication: Unstable angina.  CAD/CABG  Procedural details: The right groin was prepped, draped, and anesthetized with 1% lidocaine. Using modified Seldinger technique, a 5 French sheath was introduced into the right femoral artery. Standard Judkins catheters were used for coronary angiography and left ventriculography. Catheter exchanges were performed over a guidewire. There were no immediate procedural complications. The patient was transferred to the post catheterization recovery area for further monitoring.  Procedural Findings:   Hemodynamics:     AO 109/62    LV 113/3   Coronary angiography:   Coronary dominance: Right  Left mainstem:   Mild luminal irregularities.  Left anterior descending (LAD):   Moderate proximal and mid calcification.  Mid occlusion.  The distal vessel fills via the LIMA and has severe diffuse distal and apical disease after the graft insertion.  This is unchanged from the previous cath.  Diagonals x 2 are subtotally occluded with some right to left collateral flow.  Left circumflex (LCx):  Occluded proximally.  An OM 3 is moderate sized and fills via a vein graft.  OM1 and OM2 have scant collateral flow.  Right coronary artery (RCA):  Large vessel with a 40% proximal lesion and long mid 30% stenosis.  There is diffuse mild plaque throughout.  The PDA territory appears to be supplied by the AM which is large with ostial subtotal stenosis.  There are small to moderate sized posterolaterals distal to this with diffuse moderate non obstructive disease.  The AM is perfused via an SVG  Grafts:   SVG to OM:  Proximal 99%.  Mild diffuse non obstructive plaque throughout.   SVG to AM:  Long proximal 25% with mid 60% focal stenosis.   LIMA to LAD:  Widely patent     Left ventriculography:  Left ventricular systolic function is normal, LVEF is estimated at 65%, there is no significant mitral regurgitation   Final Conclusions:  Severe diffuse native vessel CAD.  Severe graft disease as described.    Recommendations: PCI of SVG to OM3.  He needs risk reduction.  He has not participated in good diabetes management.  [name] 02/29/2012, 8:08 AM

## 2012-02-29 NOTE — Progress Notes (Signed)
Internal Medicine teaching service attending Dr.Xan Ingraham. I have examined the patient today and reviewed the patients on going problems with the resident team. In brief patient post cath and drug eluting stent to his SVG graft to OM1 with significant diffuse clot burden in his coronary arteries with uncontrolled HTN uncontrolled Lipids post CABG 11 years ago. Will closely monitor post cath today for any complications.

## 2012-02-29 NOTE — CV Procedure (Signed)
   CARDIAC CATH NOTE  Name: Shawn Hood MRN: [id number] DOB: 04-22-1956  Procedure: PTCA and stenting of the saphenous vein graft to the OM  Indication: 56 year old black male with history of coronary disease status post CABG in 2002 presents with symptoms of unstable angina. Diagnostic cardiac catheterization demonstrates a high grade stenosis in the ostium of the saphenous vein graft to the obtuse marginal vessel. His other grafts are patent.  Procedural Details: The right groin was prepped, draped, and anesthetized with 1% lidocaine. Using the modified Seldinger technique, a 6 Fr sheath was exchanged for the 5 French diagnostic sheath in the right femoral artery.  Weight-based bivalirudin was given for anticoagulation. Once a therapeutic ACT was achieved, a 6 French left Amplatz 1 guide catheter was inserted.  A  Filter wire  was used to cross the lesion.  The lesion was predilated with a 2.5 mm balloon.  The lesion was then stented with a 3.0 x 12 mm Promus stent.  The stent was dilated with the stent balloon to 12 atmospheres. Initially the result looked good and the filter wire was retrieved. Our angiography at this point demonstrated an age dissection. We recrossed the lesion with a pro-water wire and stented the age dissection with a 3.0 x 12 mm Promus stent and overlapped with the first stent. This was deployed to 12 atmospheres. Following PCI, there was 0% residual stenosis and TIMI-3 flow. Final angiography confirmed an excellent result. The patient tolerated the procedure well. There were no immediate procedural complications. Femoral hemostasis was achieved with manual compression. The patient was transferred to the post catheterization recovery area for further monitoring.  Lesion Data: Vessel: Saphenous vein graft to the obtuse marginal vessel/ostial Percent stenosis (pre): 95% TIMI-flow (pre):  3 Stent:  3.0 x 12 mm Promus x2 Percent stenosis (post): 0% TIMI-flow (post):  3  Conclusions: Successful stenting of the saphenous vein graft to the obtuse marginal vessel with drug-eluting stents.  Recommendations: Continue dual antiplatelet therapy for at least one year. If stable anticipate discharge tomorrow.  Devi Hopman [facility] 02/29/2012, 10:04 AM

## 2012-02-29 NOTE — H&P (View-Only) (Signed)
Internal Medicine teaching attending Dr.Seeley Southgate. I personally evaluated the patient today. I have discussed the patient care with the resident team. Patient states that he was seen by cardiology ( notes still not in the system) and they suggested aggressive medical management. Plan to continue addressing hid DM Lipids for management of his severe CAD with symptoms of angina. He can be discharged home with follow up by his PCP for his multiple problems. Of note patient states today that he does not remember denying to take statin for his Hypercholesterolemia. 

## 2012-02-29 NOTE — Progress Notes (Signed)
Subjective:    Interval Events:  Patient is resting in bed recovering from heart cath.  Returned to room about 2 hours ago.  RN is holding pressure over cath insertion site as is protocol.  No issues reported with hemostasis. Pt is denying chest pain, dyspnea, headaches, and abdominal pain.    Objective:    Vital Signs:   Temp:  [97.8 F (36.6 C)-98.6 F (37 C)] 98.1 F (36.7 C) (09/06 1048) Pulse Rate:  [50-73] 50  (09/06 1048) Resp:  [18-20] 20  (09/06 1048) BP: (108-138)/(66-69) 117/66 mmHg (09/06 1048) SpO2:  [96 %-99 %] 99 % (09/06 1048) Last BM Date: 02/28/12   Weights: 24-hour Weight change:   Filed Weights   02/27/12 1044 02/27/12 1330  Weight: 220 lb (99.791 kg) 211 lb 12.8 oz (96.072 kg)     Intake/Output:   Intake/Output Summary (Last 24 hours) at 02/29/12 1204 Last data filed at 02/28/12 1800  Gross per 24 hour  Intake    600 ml  Output    750 ml  Net   -150 ml       Physical Exam: General appearance: alert, cooperative and no distress Resp: clear to auscultation bilaterally Cardio: regular rate and rhythm, S1, S2 normal, no murmur, click, rub or gallop GI: soft, non-tender; bowel sounds normal; no masses,  no organomegaly Incision/Wound: Pressure being held by RN as is protocol post cath    Labs: Basic Metabolic Panel:  Lab 02/29/12 1610 02/28/12 0500 02/27/12 1558 02/27/12 0831  NA 136 139 -- 136  K 3.7 3.2* -- 3.8  CL 102 102 -- 102  CO2 24 29 -- 27  GLUCOSE 155* 188* -- 291*  BUN 12 12 -- 13  CREATININE 0.82 0.87 -- 0.87  CALCIUM 9.4 9.3 -- 9.0  MG -- -- 1.7 --  PHOS -- -- -- --   Cardiac Enzymes:  Lab 02/29/12 0325 02/28/12 2132 02/28/12 1643 02/27/12 2216 02/27/12 1558  CKTOTAL -- -- -- 73 73  CKMB -- -- -- 1.5 1.7  CKMBINDEX -- -- -- -- --  TROPONINI <0.30 <0.30 <0.30 <0.30 <0.30   CBG:  Lab 02/29/12 1031 02/28/12 2040 02/28/12 1653 02/28/12 1506 02/28/12 1121  GLUCAP 94 212* 135* 80 237*   Other results: EKG  Results: Rate:  50 PR:  186 QRS:  88 QTc:  368 EKG: normal EKG, normal sinus rhythm, unchanged from previous tracings, nonspecific ST and T waves changes.  Imaging: No results found.    Medications:    Infusions:    . sodium chloride 1 mL/kg/hr (02/29/12 1200)  . DISCONTD: sodium chloride 1 mL/kg/hr (02/29/12 0214)  . DISCONTD: heparin Stopped (02/29/12 9604)     Scheduled Medications:    . aspirin  324 mg Oral Pre-Cath  . aspirin EC  325 mg Oral Daily  . atorvastatin  40 mg Oral q1800  . bivalirudin      . clopidogrel  75 mg Oral QAC breakfast  . fentaNYL      . heparin      . heparin      . heparin      . heparin  4,000 Units Intravenous Once  . insulin aspart  0-15 Units Subcutaneous TID WC  . insulin aspart  0-5 Units Subcutaneous QHS  . insulin aspart  20 Units Subcutaneous BID WC  . insulin glargine  20 Units Subcutaneous QHS  . insulin glargine  50 Units Subcutaneous Daily  . isosorbide mononitrate  30 mg Oral  Daily  . lidocaine      . lidocaine      . lisinopril  20 mg Oral QHS  . midazolam      . midazolam      . nitroGLYCERIN      . potassium chloride  40 mEq Oral Q4H  . DISCONTD: enoxaparin (LOVENOX) injection  40 mg Subcutaneous Q24H  . DISCONTD: insulin aspart  0-9 Units Subcutaneous TID WC  . DISCONTD: insulin aspart  10 Units Subcutaneous Q breakfast  . DISCONTD: insulin aspart  20 Units Subcutaneous BID WC  . DISCONTD: insulin aspart  20 Units Subcutaneous TID WC  . DISCONTD: insulin aspart  20 Units Subcutaneous BID WC  . DISCONTD: insulin glargine  25 Units Subcutaneous Daily  . DISCONTD: insulin glargine  50 Units Subcutaneous BID  . DISCONTD: insulin glargine  50 Units Subcutaneous Daily  . DISCONTD: insulin glargine  50 Units Subcutaneous Daily  . DISCONTD: sodium chloride  3 mL Intravenous Q12H     PRN Medications: morphine injection, nitroGLYCERIN   Assessment/ Plan:    1.   Chest pain:  Cardiology placed a DIS in the SVG to  OM where 99% occlusion was demonstrated.  Disease noted in SVG to AM (60%).  LIMA to LAD was clean.  He is already on dual platelet therapy and that will continue.  To address his cardiac risk factors, he will remain on atorvastatin 40mg  daily and his insulin regimen.  His BP is well controlled, but he is unable to tolerate at beta blocker because of bradycardia.  If he is stable tomorrow, he will be ready for discharge from cardiology's standpoint. - Continue aspirin and clopidogrel - Continue atorvastatin - AM EKG tomorrow  2.   DM:  Poorly controlled.  A1c 12.1 because of non-compliance (financial reasons). - Consult DM educator  - Consult social work  - Metformin held  - qHS correctional insulin  - Sensitive qAC correctional insulin  - 20U insulin aspart with breakfast and lunch  - 50U insulin glargine BID  3.    Hypertension:  Well controlled. - Continue lisinopril 20mg  here (quinapril at home)  - Continue isosorbide mononitrate 30mg   4.   Hypokalemia:  Low yesterday at 3.2.  Replaced orally.  Now 3.7.  5.   Hyperlipidemia:  LDL 152.  Continue statin. - Continue atorvastatin  6.   Prophylaxis:  SCDs  7.   Disposition:  Inpatient status requried to monitor following cath, likely d/c tomorrow.   Length of Stay: 2 days   Signed by:  Dorthula Rue. Earlene Plater, MD PGY-I, Internal Medicine Pager [phone]  02/29/2012, 12:04 PM

## 2012-02-29 NOTE — Interval H&P Note (Signed)
History and Physical Interval Note:  02/29/2012 9:09 AM  Shawn Hood  has presented today for surgery, with the diagnosis of coronary blockage.  The various methods of treatment have been discussed with the patient and family. After consideration of risks, benefits and other options for treatment, the patient has consented to  Percutaneous coronary intervention with stent as a surgical intervention .  The patient's history has been reviewed, patient examined, no change in status, stable for surgery.  I have reviewed the patient's chart and labs.  Questions were answered to the patient's satisfaction.     Theron Arista [facility] 02/29/2012 9:09 AM

## 2012-02-29 NOTE — Progress Notes (Signed)
Site area: right groin  Site Prior to Removal:  Level 0  Pressure Applied For 20 MINUTES    Minutes Beginning at 1230   Manual:   yes  Patient Status During Pull:  Stable and  Post Pull Groin Site:  Level 0  Post Pull Instructions Given:  yes  Post Pull Pulses Present:  yes  Dressing Applied:  yes  Comments:

## 2012-03-01 DIAGNOSIS — I2510 Atherosclerotic heart disease of native coronary artery without angina pectoris: Secondary | ICD-10-CM

## 2012-03-01 DIAGNOSIS — E119 Type 2 diabetes mellitus without complications: Secondary | ICD-10-CM

## 2012-03-01 DIAGNOSIS — I25810 Atherosclerosis of coronary artery bypass graft(s) without angina pectoris: Secondary | ICD-10-CM

## 2012-03-01 DIAGNOSIS — Z9861 Coronary angioplasty status: Secondary | ICD-10-CM

## 2012-03-01 LAB — BASIC METABOLIC PANEL
Calcium: 9.1 mg/dL (ref 8.4–10.5)
GFR calc Af Amer: >90 mL/min (ref >90)
GFR calc non Af Amer: >90 mL/min (ref >90)
Sodium: 137 mEq/L (ref 135–145)

## 2012-03-01 LAB — CBC
MCH: 28.9 pg (ref 26.0–34.0)
MCHC: 34.8 g/dL (ref 30.0–36.0)
Platelets: 213 10*3/uL (ref 150–400)

## 2012-03-01 MED ORDER — NITROGLYCERIN 0.4 MG SL SUBL: 0.4000 mg | SUBLINGUAL_TABLET | SUBLINGUAL | Status: DC | PRN

## 2012-03-01 NOTE — Progress Notes (Signed)
03/01/2012 1025 NCM spoke to pt and states everything worked out in regards to his medications. No need identified. Isidoro Donning RN CCM Case Mgmt phone [phone]

## 2012-03-01 NOTE — Progress Notes (Signed)
CSW consulted by RN re: medication assistance. CSW referred the RN to [facility] [phone]. CSW signing off, no other psychosocial concerns identified. Please re-consult as needed.  Lia Foyer, LCSWA [facility] Clinical Social Worker Contact #: [phone] (weekend)

## 2012-03-01 NOTE — Progress Notes (Addendum)
S: patient is s/p PCI . States that he feels good and ready to go home. O: General: NAD      Lungs CTAB      Heart:RRR     Abd: soft, BS x3     Ext: right groin site WNL. PPP A/P - Lab and EKG reviewed. ready for discharge - will follow up with his cardiologist office next week and outpatient cardiac rehab will be arranged by their office.  -will follow up with the [facility] internal medicine outpatient clinic next week. Appt made - patient is aware of the importance of aggressive risk factor management as outpatient.  - Nitroglycerin PRN added to his regimen after discussing with card. - he will receive instruction on care for his groin site from his nurse before discharge.

## 2012-03-01 NOTE — Progress Notes (Signed)
CARDIAC REHAB PHASE I   PRE:  Rate/Rhythm: Sinus Rhythm 61  BP:    Sitting: 109/66    SaO2: 98% Room Air  MODE:  Ambulation: 700 ft   POST:  Rate/Rhythem: Sinus Rhythm 66  BP:    Sitting: 123/64    SaO2: 99% Room Air [phone]  Patient ambulated in the hallway without complaints or chest pain.  Education completed patient is interested in outpatient phase 2 cardiac rehab in [place].  Harlon Flor, Arta Bruce

## 2012-03-01 NOTE — Discharge Summary (Signed)
Patient Name:  Shawn Hood MRN: [id number]  PCP: Pcp Not In System DOB:  [birth year]       Date of Admission:  02/27/2012  Date of Discharge:  03/02/2012      Attending Physician: No att. providers found       DISCHARGE DIAGNOSES: 1.   Acute coronary syndrome: 99% occlusion to the SVG-OM graft. DES placed. 2.   DM:  Poorly controlled because of non-compliance. Home with Lantus 50U BID, NovoLog with breakfast and lunch, and metformin BID. 3.   Hypertension:  Well controlled.  No change to previous regimen. 4.   Hypokalemia:  Low at 3.2.  Oral replacement given.  Resolved. 5.   Hyperlipidemia:  LDL 152. Goal is at least < 100.  Atorvastatin started.   DISPOSITION AND FOLLOW-UP: Shawn Hood is to follow-up with the listed providers as detailed below, at which time, the following should be addressed:   1. Follow-up visits: 1. PRIMARY CARE 1. DIABETES:  Assess glycemic control on current regimen of LANTUS 50U BID and NOVOLOG 20U with breakfast and lunch. 2. DYSLIPIDEMIA:  Atorvastatin 40mg  daily started. 3. CHEST PAIN:  Cardiac catheterization with left femoral access.  2. Labs / imaging needed: 1. CBG/BMET:  Glycemic control   Follow-up Information    Follow up with Shawn Hopes, MD on 03/04/2012. (PRIMARY CARE - You have an appointment at 2:40 PM on Tuesday, September 10.)    Contact information:   [facility] [address] [place] [address] [phone]       Follow up with [facility] CARD Nicholes Rough. (The Office will call.)    Contact information:   [address] [place] [address]         Discharge Orders    Future Appointments: Provider: Department: Dept Phone: Center:   03/06/2012 2:30 PM Leodis Sias, MD [facility] [phone] [facility]     Future Orders Please Complete By Expires   Amb Referral to Cardiac Rehabilitation      Diet - low sodium heart healthy      Increase activity slowly      Call MD for:  severe uncontrolled pain      Call MD for:  persistant nausea and vomiting      Call MD for:  difficulty breathing, headache or visual disturbances      Call MD for:  extreme fatigue      Call MD for:  persistant dizziness or light-headedness          DISCHARGE MEDICATIONS: Medication List  As of 03/02/2012  7:23 AM   TAKE these medications         aspirin EC 325 MG tablet   Take 325 mg by mouth daily.      atorvastatin 40 MG tablet   Commonly known as: LIPITOR   Take 1 tablet (40 mg total) by mouth daily at 6 PM.      clopidogrel 75 MG tablet   Commonly known as: PLAVIX   Take 75 mg by mouth daily.      insulin aspart 100 UNIT/ML injection   Commonly known as: novoLOG   Inject 20 Units into the skin 2 (two) times daily with breakfast and lunch.      insulin glargine 100 UNIT/ML injection   Commonly known as: LANTUS   Inject 50 Units into the skin 2 (two) times daily.      isosorbide mononitrate 30  MG 24 hr tablet   Commonly known as: IMDUR   Take 30 mg by mouth daily.      METFORMIN HCL PO   Take by mouth 2 (two) times daily with a meal.      nitroGLYCERIN 0.4 MG SL tablet   Commonly known as: NITROSTAT   Place 1 tablet (0.4 mg total) under the tongue every 5 (five) minutes as needed for chest pain.      quinapril 20 MG tablet   Commonly known as: ACCUPRIL   Take 20 mg by mouth at bedtime.             CONSULTS:  1.   Cardiology   PROCEDURES PERFORMED:  Dg Chest Portable 1 View 02/27/2012 IMPRESSION: Low volume exam.  No acute chest process   Original Report Authenticated By: Judie Petit. Ruel Favors, M.D.      Cardiac Cathetrization Hemodynamics:  AO 109/62  LV 113/3  Coronary dominance: Right  Left mainstem: Mild luminal irregularities.  Left anterior descending (LAD): Moderate proximal and mid calcification. Mid occlusion. The distal vessel fills via the LIMA and has severe diffuse distal and apical disease after the  graft insertion. This is unchanged from the previous cath. Diagonals x 2 are subtotally occluded with some right to left collateral flow.  Left circumflex (LCx): Occluded proximally. An OM 3 is moderate sized and fills via a vein graft. OM1 and OM2 have scant collateral flow.  Right coronary artery (RCA): Large vessel with a 40% proximal lesion and long mid 30% stenosis. There is diffuse mild plaque throughout. The PDA territory appears to be supplied by the AM which is large with ostial subtotal stenosis. There are small to moderate sized posterolaterals distal to this with diffuse moderate non obstructive disease. The AM is perfused via an SVG  SVG to OM: Proximal 99%. Mild diffuse non obstructive plaque throughout.  SVG to AM: Long proximal 25% with mid 60% focal stenosis.  LIMA to LAD: Widely patent  Left ventriculography: Left ventricular systolic function is normal, LVEF is estimated at 65%, there is no significant mitral regurgitation  Final Conclusions: Severe diffuse native vessel CAD. Severe graft disease as described.  Recommendations: PCI of SVG to OM3. He needs risk reduction. He has not participated in good diabetes management.   ADMISSION DATA: H&P:  This is a 56 year old male with PMH significant for CAD s/p CABG 2002, Diabetes Mellitus Type 2 ( Hgb A1c 12.1 09/2011), Hypertension, CVA and Hyperlipidemia who presented to the ED with chest pain. He reports that he woke up this morning at 6 am with chest pain, pressure like in characte , 7/10 in severity , located on the left sided area and Moved down his left arm where he had a feeling of numbness . The pain was associated with some nausea and dizziness although his dizziness on a regular basis. No aggravating or alleviating factors. He took aspirin this morning but the pain did not improve. Therefore he called EMS. On his way to the ED he received Aspirin and Nitroglycerin which did improve his symptoms. He noted that he has experienced  the pain in the past When he had an MI in 2002 and in April of 2013. Denies any recent travel, surgeries or immobilization. Patient noted he has been experiencing some dizziness which has been present for some time. He can not recall when everything started. It is mostly when he gets up to fast. It is a feeling of light headedness.  Physical Exam: Vitals: Blood pressure 125/70, pulse 66, resp. rate 22, height 5\' 11"  (1.803 m), weight 220 lb (99.791 kg), SpO2 100.00%.  Constitutional: Vital signs reviewed. Patient is a well-developed and well-nourished in no acute distress and cooperative with exam. Alert and oriented x3.  Mouth: Poor dentition. No erythema or exudates, MMM  Eyes: PERRL, EOMI, conjunctivae normal, No scleral icterus.  Neck: Supple,  Cardiovascular: RRR, S1 normal, S2 normal, no MRG, pulses symmetric and intact bilaterally  Pulmonary/Chest: CTAB, no wheezes, rales, or rhonchi  Abdominal: Soft. Non-tender, non-distended, bowel sounds are normal, no masses, organomegaly, or guarding present.  Hematology: no cervicaladenopathy.  Neurological: A&O x3, Strenght is 5/5 in all extremities except in LUE 4/5, cranial nerve II-XII are grossly intact, sensory intact to light touch bilaterally except decreased in the LLE.  Skin: Warm, dry and intact. No rash, cyanosis, or clubbing.   Labs: Basic Metabolic Panel:  [facility]  02/27/12 0831   NA  136   K  3.8   CL  102   CO2  27   GLUCOSE  291*   BUN  13   CREATININE  0.87   CALCIUM  9.0   MG  --   PHOS  --    Liver Function Tests:  [facility]  02/27/12 0831   AST  14   ALT  10   ALKPHOS  93   BILITOT  0.4   PROT  7.0   ALBUMIN  3.3*    CBC:  Basename  02/27/12 0831   WBC  4.9   NEUTROABS  --   HGB  13.1   HCT  38.1*   MCV  81.6   PLT  205    Coagulation:  Basename  02/27/12 0831   LABPROT  12.7   INR  0.93     HOSPITAL COURSE: 1.   Acute coronary syndrome:  Initial CEs and EKG did not suggest acute ischemia.  On  hospital day 2, pt had another episode of angina associated with EKG changes, but CEs were negative again.  On hospital day 3, pt taken to cath lab where 99% occlusion of SVG-OM was demonstrated.  DES placed.  Patient was already on dual anti-platelet therapy.  Risk factors addressed.  2.   Type II diabetes mellitus:  Poorly controlled because of non-compliance.  Non-compliance related to financial issues.  Hemoglobin A1c was 12.1 here.  We held his metformin, but had him on a previous regimen of Lantus 50U BID plus 20U of NovoLog with breakfast and lunch.  CBGs still ranged from the 80s to the 200s with this.  Patient states he believes his finances will allow for continued insulin therapy after discharge.  3.   Hypertension:  Well controlled.  While here, lisinopril was substituted for quinapril, but home regimen of quinapril 20mg  qHS and isosorbide mononitrate 30mg  daily was continued at discharge.  4.   Hyperlipidemia:  LDL is 152.  Goal is at least < 100 and <80 is optimal.  We started atorvastatin 40mg .   DISCHARGE DATA: Vital Signs: BP 92/51  Pulse 58  Temp 98.9 F (37.2 C) (Oral)  Resp 21  Ht 5\' 11"  (1.803 m)  Wt 212 lb 8.4 oz (96.4 kg)  BMI 29.64 kg/m2  SpO2 98%  Labs: Results for orders placed during the hospital encounter of 02/27/12 (from the past 24 hour(s))  GLUCOSE, CAPILLARY     Status: Normal   Collection Time   03/01/12  7:43 AM  Component Value Range   Glucose-Capillary 87  70 - 99 mg/dL   Comment 1 Notify RN       Time spent on discharge: 20 minutes   Signed by:  Dorthula Rue. Earlene Plater, MD PGY-I, Internal Medicine  03/02/2012, 7:23 AM

## 2012-03-01 NOTE — Plan of Care (Signed)
Problem: Consults Goal: Chest Pain Patient Education (See Patient Education module for education specifics.)  Outcome: Completed/Met Date Met:  03/01/12 Education completed will be seen by cardiac rehab tomorrow with reinforcement of information Goal: Tobacco Cessation referral if indicated Outcome: Not Applicable Date Met:  03/01/12 Quit smoking years ago Goal: Nutrition Consult-if indicated Outcome: Not Applicable Date Met:  03/01/12 Cardiac rehab will review tomorrow.No need for nutritional consult noted Goal: Diabetes Guidelines if Diabetic/Glucose > 140 If diabetic or lab glucose is > 140 mg/dl - Initiate Diabetes/Hyperglycemia Guidelines & Document Interventions  Outcome: Completed/Met Date Met:  03/01/12 Diabetic Educator into see patient into see and review information, A1c 14.2 because patient was unable to afford insulin. Problem has been solved. See note from diabetes education.

## 2012-03-01 NOTE — Progress Notes (Signed)
Patient ID: Shawn Hood, male   DOB: [birth year], 56 y.o.   MRN: [id number] Subjective:  S/p PCI stenting, OM.  Objective:  Vital Signs in the last 24 hours: Temp:  [98 F (36.7 C)-98.9 F (37.2 C)] 98.9 F (37.2 C) (09/07 0714) Pulse Rate:  [50-66] 58  (09/07 0714) Resp:  [16-27] 21  (09/07 0714) BP: (92-147)/(51-82) 92/51 mmHg (09/07 0714) SpO2:  [97 %-100 %] 98 % (09/07 0714) Weight:  [212 lb 8.4 oz (96.4 kg)] 212 lb 8.4 oz (96.4 kg) (09/07 0019)  Intake/Output from previous day: 09/06 0701 - 09/07 0700 In: 727 [P.O.:120; I.V.:607] Out: 1575 [Urine:1575] Intake/Output from this shift: Total I/O In: 240 [P.O.:240] Out: -   Physical Exam: Well appearing NAD HEENT: Unremarkable Neck:  No JVD, no thyromegally Lymphatics:  No adenopathy Back:  No CVA tenderness Lungs:  Clear with no wheezes. HEART:  Regular rate rhythm, no murmurs, no rubs, no clicks Abd:  Flat, positive bowel sounds, no organomegally, no rebound, no guarding Ext:  2 plus pulses, no edema, no cyanosis, no clubbing Skin:  No rashes no nodules Neuro:  CN II through XII intact, motor grossly intact  Lab Results:  Basename 03/01/12 0645  WBC 6.2  HGB 12.5*  PLT 213    Basename 03/01/12 0645 02/29/12 0325  NA 137 136  K 3.6 3.7  CL 104 102  CO2 26 24  GLUCOSE 92 155*  BUN 10 12  CREATININE 0.93 0.82    Basename 02/29/12 0325 02/28/12 2132  TROPONINI <0.30 <0.30   Hepatic Function Panel No results found for this basename: PROT,ALBUMIN,AST,ALT,ALKPHOS,BILITOT,BILIDIR,IBILI in the last 72 hours  Basename 02/28/12 0500  CHOL 207*   No results found for this basename: PROTIME in the last 72 hours  Imaging: No results found.  Cardiac Studies: Tele - NSR Assessment/Plan:  1. Chest pain 2. CAD, s/p PCI OM.  3. DM Rec: ok for discharge home on ASA and plavix and other cardiac meds. Followup arranged with our [facility] office.  LOS: 3 days    Buel Ream.D. 03/01/2012, 9:25 AM

## 2012-03-01 NOTE — Progress Notes (Signed)
Internal Medicine Teaching Service Attending Dr.Koralynn Greenspan I have examined the patient at bedside today and discussed the management of the Patient with the resident team. Patient post PCI stent placement to SVG graft to OM1. Will need aggressive risk control given his other diffuse atherosclerosis. Medically fit for discharge.

## 2012-03-01 NOTE — Plan of Care (Signed)
Problem: Phase III Progression Outcomes Goal: No angina with increased activity Outcome: Completed/Met Date Met:  03/01/12 Up in room ambulating with no complaints of chest pain or shortness of breath  Goal: Tolerating diet Outcome: Completed/Met Date Met:  03/01/12 Ate today with no problems, tolerating well.  Goal: Discharge plan remains appropriate-arrangements made Outcome: Completed/Met Date Met:  03/01/12 Planned discharge home tomorrow with wife Goal: Vital signs stable Outcome: Completed/Met Date Met:  03/01/12 VSS Goal: Vascular site scale level 0 - I Vascular Site Scale Level 0: No bruising/bleeding/hematoma Level I (Mild): Bruising/Ecchymosis, minimal bleeding/ooozing, palpable hematoma < 3 cm Level II (Moderate): Bleeding not affecting hemodynamic parameters, pseudoaneurysm, palpable hematoma > 3 cm  Outcome: Completed/Met Date Met:  03/01/12 Right radial site level 0 with positive reverse allens, csms wnls, radial and ulnar pulses +2.

## 2012-03-01 NOTE — Plan of Care (Signed)
Problem: Phase III Progression Outcomes Goal: Vascular site scale level 0 - I Vascular Site Scale Level 0: No bruising/bleeding/hematoma Level I (Mild): Bruising/Ecchymosis, minimal bleeding/ooozing, palpable hematoma < 3 cm Level II (Moderate): Bleeding not affecting hemodynamic parameters, pseudoaneurysm, palpable hematoma > 3 cm  Outcome: Completed/Met Date Met:  03/01/12 Gauze dressing removed and bandaid applied right groin site level 0 with no tenderness

## 2012-03-04 MED FILL — Dextrose Inj 5%: INTRAVENOUS | Qty: 1000 | Status: AC

## 2012-03-06 ENCOUNTER — Ambulatory Visit: Payer: Medicare Other | Admitting: Internal Medicine

## 2012-03-06 ENCOUNTER — Encounter: Payer: Medicare Other | Admitting: Internal Medicine

## 2012-09-04 ENCOUNTER — Observation Stay (HOSPITAL_COMMUNITY)
Admission: EM | Admit: 2012-09-04 | Discharge: 2012-09-05 | Disposition: A | Discharge status: Home / Self Care | Payer: Medicare Other | Attending: Internal Medicine | Admitting: Internal Medicine

## 2012-09-04 ENCOUNTER — Encounter (HOSPITAL_COMMUNITY): Payer: Self-pay | Admitting: Emergency Medicine

## 2012-09-04 ENCOUNTER — Emergency Department (HOSPITAL_COMMUNITY): Payer: Medicare Other

## 2012-09-04 DIAGNOSIS — Z951 Presence of aortocoronary bypass graft: Secondary | ICD-10-CM | POA: Insufficient documentation

## 2012-09-04 DIAGNOSIS — I25810 Atherosclerosis of coronary artery bypass graft(s) without angina pectoris: Secondary | ICD-10-CM | POA: Diagnosis present

## 2012-09-04 DIAGNOSIS — IMO0001 Reserved for inherently not codable concepts without codable children: Secondary | ICD-10-CM | POA: Insufficient documentation

## 2012-09-04 DIAGNOSIS — I2510 Atherosclerotic heart disease of native coronary artery without angina pectoris: Secondary | ICD-10-CM | POA: Insufficient documentation

## 2012-09-04 DIAGNOSIS — E785 Hyperlipidemia, unspecified: Secondary | ICD-10-CM | POA: Insufficient documentation

## 2012-09-04 DIAGNOSIS — I498 Other specified cardiac arrhythmias: Secondary | ICD-10-CM | POA: Insufficient documentation

## 2012-09-04 DIAGNOSIS — E1165 Type 2 diabetes mellitus with hyperglycemia: Secondary | ICD-10-CM | POA: Diagnosis present

## 2012-09-04 DIAGNOSIS — R079 Chest pain, unspecified: Principal | ICD-10-CM | POA: Insufficient documentation

## 2012-09-04 DIAGNOSIS — I10 Essential (primary) hypertension: Secondary | ICD-10-CM | POA: Insufficient documentation

## 2012-09-04 DIAGNOSIS — E118 Type 2 diabetes mellitus with unspecified complications: Secondary | ICD-10-CM | POA: Diagnosis present

## 2012-09-04 HISTORY — DX: Type 2 diabetes mellitus with unspecified complications: E11.8

## 2012-09-04 HISTORY — DX: Acute myocardial infarction, unspecified: I21.9

## 2012-09-04 HISTORY — DX: Reserved for concepts with insufficient information to code with codable children: IMO0002

## 2012-09-04 HISTORY — DX: Type 2 diabetes mellitus with hyperglycemia: E11.65

## 2012-09-04 LAB — LIPID PANEL
HDL: 37 mg/dL — ABNORMAL LOW (ref >39)
LDL Cholesterol: 69 mg/dL (ref 0–99)
Triglycerides: 36 mg/dL (ref <150)

## 2012-09-04 LAB — TROPONIN I: Troponin I: <0.3 ng/mL (ref <0.30)

## 2012-09-04 LAB — COMPREHENSIVE METABOLIC PANEL
ALT: 18 U/L (ref 0–53)
AST: 19 U/L (ref 0–37)
Albumin: 3.6 g/dL (ref 3.5–5.2)
Calcium: 9.1 mg/dL (ref 8.4–10.5)
GFR calc Af Amer: >90 mL/min (ref >90)
Sodium: 139 mEq/L (ref 135–145)
Total Protein: 7.6 g/dL (ref 6.0–8.3)

## 2012-09-04 LAB — PROTIME-INR
INR: 0.92 (ref 0.00–1.49)
Prothrombin Time: 12.3 seconds (ref 11.6–15.2)

## 2012-09-04 LAB — POCT I-STAT, CHEM 8
HCT: 41 % (ref 39.0–52.0)
Hemoglobin: 13.9 g/dL (ref 13.0–17.0)
Potassium: 3.6 mEq/L (ref 3.5–5.1)
Sodium: 144 mEq/L (ref 135–145)

## 2012-09-04 LAB — GLUCOSE, CAPILLARY: Glucose-Capillary: 123 mg/dL — ABNORMAL HIGH (ref 70–99)

## 2012-09-04 LAB — CBC WITH DIFFERENTIAL/PLATELET
Basophils Relative: 1 % (ref 0–1)
HCT: 38.4 % — ABNORMAL LOW (ref 39.0–52.0)
Hemoglobin: 13.3 g/dL (ref 13.0–17.0)
MCH: 27.9 pg (ref 26.0–34.0)
MCHC: 34.6 g/dL (ref 30.0–36.0)
Monocytes Absolute: 0.3 10*3/uL (ref 0.1–1.0)
Monocytes Relative: 6 % (ref 3–12)
Neutro Abs: 1.8 10*3/uL (ref 1.7–7.7)

## 2012-09-04 LAB — MAGNESIUM: Magnesium: 2 mg/dL (ref 1.5–2.5)

## 2012-09-04 LAB — POCT I-STAT TROPONIN I: Troponin i, poc: 0.01 ng/mL (ref 0.00–0.08)

## 2012-09-04 MED ORDER — ISOSORBIDE MONONITRATE ER 30 MG PO TB24
30.0000 mg | ORAL_TABLET | Freq: Every day | ORAL | Status: DC
  Administered 2012-09-04 – 2012-09-05 (×2): 30 mg via ORAL
  Filled 2012-09-04 (×2): qty 1

## 2012-09-04 MED ORDER — ONDANSETRON HCL 4 MG/2ML IJ SOLN: 4.0000 mg | Freq: Four times a day (QID) | INTRAMUSCULAR | Status: DC | PRN

## 2012-09-04 MED ORDER — INSULIN ASPART 100 UNIT/ML ~~LOC~~ SOLN: 10.0000 [IU] | Freq: Three times a day (TID) | SUBCUTANEOUS | Status: DC

## 2012-09-04 MED ORDER — ASPIRIN EC 325 MG PO TBEC
325.0000 mg | DELAYED_RELEASE_TABLET | Freq: Every day | ORAL | Status: DC
  Administered 2012-09-05: 325 mg via ORAL
  Filled 2012-09-04 (×2): qty 1

## 2012-09-04 MED ORDER — CLOPIDOGREL BISULFATE 75 MG PO TABS
75.0000 mg | ORAL_TABLET | Freq: Every day | ORAL | Status: DC
  Administered 2012-09-04 – 2012-09-05 (×2): 75 mg via ORAL
  Filled 2012-09-04 (×2): qty 1

## 2012-09-04 MED ORDER — INSULIN GLARGINE 100 UNIT/ML ~~LOC~~ SOLN
30.0000 [IU] | Freq: Every day | SUBCUTANEOUS | Status: DC
  Administered 2012-09-04: 30 [IU] via SUBCUTANEOUS

## 2012-09-04 MED ORDER — ATORVASTATIN CALCIUM 40 MG PO TABS
40.0000 mg | ORAL_TABLET | Freq: Every day | ORAL | Status: DC
  Administered 2012-09-04: 40 mg via ORAL
  Filled 2012-09-04 (×2): qty 1

## 2012-09-04 MED ORDER — INSULIN ASPART 100 UNIT/ML ~~LOC~~ SOLN: 0.0000 [IU] | Freq: Three times a day (TID) | SUBCUTANEOUS | Status: DC

## 2012-09-04 MED ORDER — SODIUM CHLORIDE 0.9 % IJ SOLN: 3.0000 mL | INTRAMUSCULAR | Status: DC | PRN

## 2012-09-04 MED ORDER — SODIUM CHLORIDE 0.9 % IJ SOLN
3.0000 mL | Freq: Two times a day (BID) | INTRAMUSCULAR | Status: DC
  Administered 2012-09-04 – 2012-09-05 (×3): 3 mL via INTRAVENOUS

## 2012-09-04 MED ORDER — ACETAMINOPHEN 325 MG PO TABS: 650.0000 mg | ORAL_TABLET | ORAL | Status: DC | PRN

## 2012-09-04 MED ORDER — SODIUM CHLORIDE 0.9 % IV SOLN: 250.0000 mL | INTRAVENOUS | Status: DC | PRN

## 2012-09-04 MED ORDER — NITROGLYCERIN 0.4 MG SL SUBL: 0.4000 mg | SUBLINGUAL_TABLET | SUBLINGUAL | Status: DC | PRN

## 2012-09-04 MED ORDER — ENOXAPARIN SODIUM 40 MG/0.4ML ~~LOC~~ SOLN
40.0000 mg | SUBCUTANEOUS | Status: DC
  Administered 2012-09-04: 40 mg via SUBCUTANEOUS
  Filled 2012-09-04 (×2): qty 0.4

## 2012-09-04 NOTE — ED Notes (Addendum)
Pt BIB EMS with c/o CP that woke him at 0530. PMH of HTN, DM, CABG x3 vessel, stents x1. Has not seen cardiologist in 3-4 years. Both PCP and cardiologist are in [place]. Pt took 324mg  ASA x2 at onset of CP. 18g Left AC started by EMS. NSR with EMS on 12 lead prior to arrival. Pain resolved on arrival to ED.

## 2012-09-04 NOTE — H&P (Signed)
Date: 09/04/2012                Patient Name:  Shawn Hood  MRN: [id number]   DOB: 04-Apr-1956  Age / Sex: 57 y.o., male   PCP: Hyman Hopes, MD    Primary Cardiologist: [facility], Dr. Gwen Pounds         Medical Service: Internal Medicine Teaching Service              Attending Physician: Dr. Eben Burow    First Contact: Dr. Garald Braver Pager: [phone]  Second Contact: Dr. Clyde Lundborg Pager: [phone]            After Hours (After 5pm / weekends / holidays): First Contact Second Contact Pager: Pager: [phone]    Chief Complaint: chest pain  History of Present Illness: Patient is a 57 y.o. male with a PMHx of CAD s/p CABG (2002) and DES to SVG-OM (02/2012 - for 99% occlusion in setting of UA) , uncontrolled DM2, HTN, and HLD who presents to [facility] for evaluation of chest pain. Patient indicates acute onset chest heaviness over his left chest that awoke him from sleep at 5:30AM. He had radiation of symptoms to his left arm, and subsequently experienced left arm numbness.  During this episode, the patient felt excessively nauseous and tachycardic, however remained without symptoms of diaphoresis or palpitations. On, he administered aspirin 325mg  x 2, called 911, and had complete cessation of the chest heaviness within 10 minutes (prior to ED arrival). Not no any specific aggravating factors during this episode, specifically did not have worsening of symptoms when walking around his house. He did not attempt to take his nitroglycerin. He has not had recurrence of the pain since initial episode.  The patient notes this chest heaviness episode to be similar to sx experienced with his last NSTEMI in 02/2012, at which time he had the SVG-OM DES placement and was noted to have diffuse coronary disease. Recently, the patient has been able to participate in his usual activities without chest pain or SOB symptoms, is able to play with his granddaugther without issue. States all of his medications  regularly without missing doses. He has not recently been ill, has not had any direct trauma to the chest (no recent car accidents, falls, etc, heavy lifting, etc)..   Review of Systems: Constitutional:  denies fever, chills, diaphoresis, appetite change and fatigue.  HEENT: denies eye pain, redness, hearing loss, ear pain, congestion, sore throat, rhinorrhea.  Respiratory: denies SOB, DOE, cough, chest tightness, and wheezing.  Cardiovascular: admits to chest heaviness that has since resolved, palpitations that have resolved. Denies leg swelling.  Gastrointestinal: denies nausea, vomiting, abdominal pain, diarrhea, constipation, blood in stool.  Genitourinary: denies dysuria, urgency, frequency, hematuria, flank pain and difficulty urinating.  Musculoskeletal: denies myalgias, back pain, joint swelling, arthralgias and gait problem.   Skin: denies pallor, rash and wound.  Neurological: denies dizziness, seizures, syncope, weakness, light-headedness, numbness and headaches.   Hematological: denies easy bruising, personal or family bleeding history.  Psychiatric: denies suicidal ideation, mood changes, sleep disturbance and agitation.    Current Outpatient Medications: Medication Sig  . aspirin EC 325 MG tablet Take 325 mg by mouth daily.  Marland Kitchen atorvastatin (LIPITOR) 40 MG tablet Take 1 tablet (40 mg total) by mouth daily at 6 PM.  . clopidogrel (PLAVIX) 75 MG tablet Take 75 mg by mouth daily.  . insulin aspart (NOVOLOG) 100 UNIT/ML injection Inject 10 Units into the skin 3 (three) times daily before meals.   Marland Kitchen  insulin glargine (LANTUS) 100 UNIT/ML injection Inject 30 Units into the skin at bedtime.   . isosorbide mononitrate (IMDUR) 30 MG 24 hr tablet Take 30 mg by mouth daily.  . metFORMIN (GLUCOPHAGE) 500 MG tablet Take 500 mg by mouth 2 (two) times daily with a meal.  . quinapril (ACCUPRIL) 20 MG tablet Take 20 mg by mouth at bedtime.  . nitroGLYCERIN (NITROSTAT) 0.4 MG SL tablet Place 1  tablet (0.4 mg total) under the tongue every 5 (five) minutes as needed for chest pain.   Allergies: No Known Allergies    Past Medical History: Past Medical History  Diagnosis Date  . Diabetes mellitus type 2, uncontrolled, with complications DX: Age 34  . Hypertension   . Coronary artery disease     a. s/p 3-vessel CABG 2002. b. NSTEMI 09/2010 with patent grafts, no obvious culprit, for med rx.  c. DES to SVG-OM, severe diffuse disease per [facility]  . Hyperlipidemia   . 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  . Sinus bradycardia     Past Surgical History: Past Surgical History  Procedure Laterality Date  . Coronary artery bypass graft  2002  . Shoulder surgery Left   . Cholecystectomy      Family History: Family History  Problem Relation Age of Onset  . Coronary artery disease Mother     Died at 76 of MI  . Coronary artery disease Brother     Died of CAD but had other medical problems  . Diabetes Mother   . Hypertension Mother   . Diabetes Sister   . Diabetes Brother   . Hypertension Brother     Social History: History   Social History  . Marital Status: Married    Spouse Name: N/A    Number of Children: 4  . Years of Education: 12th grade   Occupational History  . Disabled     since 2005, for his heart disease. Previously worked in [employer].   Social History Main Topics  . Smoking status: Former Smoker -- 1.50 packs/day for 10 years    Types: Cigarettes    Quit date: 06/26/1991  . Smokeless tobacco: Never Used     Comment: Quit 1993  . Alcohol Use: No  . Drug Use: No  . Sexually Active: Yes   Other Topics Concern  . 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      Vital Signs: Blood pressure 105/52, pulse 58, resp. rate 16, height 6' (1.829 m), weight 205 lb (92.987 kg), SpO2 100.00%.  Physical Exam: General: Vital signs reviewed and noted.  Well-developed, well-nourished, in no acute distress; alert, appropriate and cooperative throughout examination.  Head: Normocephalic, atraumatic.  Eyes: PERRL, EOMI, No signs of anemia or jaundince.  Nose: Mucous membranes moist, not inflammed, nonerythematous.  Throat: Oropharynx nonerythematous, no exudate appreciated.   Neck: No deformities, masses, or tenderness noted. Supple, No carotid Bruits, no JVD.  Lungs:  Normal respiratory effort. Clear to auscultation BL without crackles or wheezes.  Heart: RRR. S1 and S2 normal without gallop, murmur, or rubs.  Abdomen:  BS normoactive. Soft, Nondistended, non-tender.  No masses or organomegaly.  Extremities: No pretibial edema.  Neurologic: A&O X3, CN II - XII are grossly intact. Motor strength is 5/5 in the all 4 extremities, Sensations intact to light touch, Cerebellar signs negative.  Skin: No  visible rashes. Midline scar on chest from prior CABG.    Lab results:  CURRENT LABS: CBC:    Component Value Date/Time   WBC 6.2 03/01/2012 0645   HGB 13.9 09/04/2012 0812   HCT 41.0 09/04/2012 0812   PLT 213 03/01/2012 0645   MCV 82.9 03/01/2012 0645   NEUTROABS 2.3 10/02/2011 2308   LYMPHSABS 2.7 10/02/2011 2308   MONOABS 0.3 10/02/2011 2308   EOSABS 0.1 10/02/2011 2308   BASOSABS 0.0 10/02/2011 2308     Metabolic Panel:    Component Value Date/Time   NA 144 09/04/2012 0812   K 3.6 09/04/2012 0812   CL 106 09/04/2012 0812   CO2 26 03/01/2012 0645   BUN 19 09/04/2012 0812   CREATININE 0.80 09/04/2012 0812   GLUCOSE 120* 09/04/2012 0812   CALCIUM 9.1 03/01/2012 0645   AST 14 02/27/2012 0831   ALT 10 02/27/2012 0831   ALKPHOS 93 02/27/2012 0831   BILITOT 0.4 02/27/2012 0831   PROT 7.0 02/27/2012 0831   ALBUMIN 3.3* 02/27/2012 0831     Urinalysis: No results found for this basename: COLORURINE, APPERANCEUR, LABSPEC, PHURINE, GLUCOSEU, HGBUR, BILIRUBINUR, KETONESUR, PROTEINUR, UROBILINOGEN, NITRITE, LEUKOCYTESUR,  in the last 72 hours   Drugs of Abuse  No  results found for this basename: labopia,  cocainscrnur,  labbenz,  amphetmu,  thcu,  labbarb     Troponin (Point of Care Test)  Recent Labs  09/04/12 0810  TROPIPOC 0.01     HISTORICAL LABS: Lab Results  Component Value Date   HGBA1C 14.2* 02/27/2012    Lab Results  Component Value Date   CHOL 207* 02/28/2012   HDL 41 02/28/2012   LDLCALC 161* 02/28/2012   TRIG 70 02/28/2012   CHOLHDL 5.0 02/28/2012    Lab Results  Component Value Date   TSH 2.330 10/03/2011    Imaging results:   Dg Chest Portable 1 View (09/04/2012) - Elevation of the right hemidiaphragm.  No acute findings.   Original Report Authenticated By: Charlett Nose, M.D.     Other results:  EKG (09/04/2012) - Sinus bradycardia with approximate rate of 60 bpm, normal axis, ST segments: chronic TWI in leads I, avL. TWI in lead V2.Marland Kitchen    Last Echo (09/2011): Mild LVH, LV EF 50-55%.  Mild LA dilation, Borderline criteria for atrial septal aneurysm   Last Cath (02/2012): Severe diffuse native vessel CAD: 1) LAD-moderate proximal and mid calcification with mid occlusion. Diagonals x2 subtotally occluded with some right to left collateral flow, 2) RCA - he percent proximal lesion and a long mid 30% stenosis. Diffuse plaque throughout, mild. 3) Grafts - SVG to OM: Proximal 99%. Mild diffuse non obstructive plaque throughout. SVG to AM: Long proximal 25% with mid 60% focal stenosis   Assessment & Plan: Pt is a 57 y.o. yo male with a PMHX of severe CAD s/p CABG and PTCA and DES to saphenous vein graft (02/2012), who presented to [facility] on 09/04/2012 with symptoms of left chest heaviness with radiation to left arm. Admission EKG showing chronic TWI in lateral leads and initial troponins of < 0.3. Symptoms are of his anginal equivalent during prior NSTEMI.  1) Chest pain - cause of pain is unclear at this time, concerning for possible ACS although sx resolved at this patient. He is still at high risk given pain is his anginal equivalent  from prior NSTEMIs, additionally is know to have diffuse coronary disease and multiple uncontrolled comorbidities. Therefore, will be necessary to rule out  for acute cardiac event. He otherwise has no risk factors for PE, Geneva score of 0 - therefore, unlikely. As well, no recent increased activity or reproducible pain to indicate MSK origin. No recent illness to indicate PNA or other infectious contributer.    Plan: - Continue to cycle cardiac enzymes. - Continue aspirin, statin therapy. - No beta blocker 2/2 bradycardia. - Continue morphine, oxygen, PRN nitroglycerin. - Check HgA1c, TSH, lipid panel for risk factor stratification.  - Check CBC. - Consider repeat echocardiogram.  2) DM2, uncontrolled with complications - Home regimen of Lantus 30 units and Humalog 10 units TID.  - Will continue home medications. - Add SSI. - Carb mod diet.  3) HLD - last LDL of 152, not at goal for CAD and DM2 - Atorvastatin was added during last admission in response to this value. - Continue Atorvastatin for now. - Will recheck lipid panel, adjust therapy as indicated.  4) HTN - stable on admission, although most recent levels slightly low. - Will continue imdur with hold parameters. - Hold accupril for now, until stabilization of BP. - No BB in setting of bradycardia.  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 - Continue aspirin, statin as above.  6) Sinus bradycardia - chronic and unchanged, no AV blocks noted.  - Monitor on tele. - Check TSH.    DVT PPX - low molecular weight heparin  CODE STATUS - FULL - dicussed with patient  CONSULTS PLACED - None at present (if needed, has seen [facility] on multiple admissions)  DISPO - Disposition is deferred at this time, awaiting evaluation of chest pain.   Anticipated discharge in approximately 1-2 day(s).   The patient does have a current PCP (Harriett Pietro Cassis, MD), and does not need OPC follow-up after  discharge.   Lastly, the patient does not have transportation limitations that hinder transportation to clinic appointments.    Signed: Priscella Mann, DO  PGY-3, Internal Medicine Resident Pager (7AM-5PM): [phone] 09/04/2012, 10:14 AM

## 2012-09-04 NOTE — ED Provider Notes (Addendum)
History     CSN: [id number]  Arrival date & time 09/04/12  0701   First MD Initiated Contact with Patient 09/04/12 [phone]      Chief Complaint  Patient presents with  . Chest Pain     HPI Pt BIB EMS with c/o CP that woke him at 0530. PMH of HTN, DM, CABG x3 vessel, stents x1. Has not seen cardiologist in 3-4 years. Both PCP and cardiologist are in [place]. Pt took 324mg  ASA x2 at onset of CP. 18g Left AC started by EMS. NSR with EMS on 12 lead prior to arrival. Pain resolved on arrival to ED.  Past Medical History  Diagnosis Date  . Diabetes mellitus type 2, uncontrolled, with complications DX: Age 57  . Hypertension   . Coronary artery disease     a. s/p 3-vessel CABG 2002. b. NSTEMI 09/2010 with patent grafts, no obvious culprit, for med rx.  . Hyperlipidemia   . 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  . Sinus bradycardia     Past Surgical History  Procedure Laterality Date  . Coronary artery bypass graft  2002  . Shoulder surgery Left   . Cholecystectomy      Family History  Problem Relation Age of Onset  . Coronary artery disease Mother     Died at 5 of MI  . Coronary artery disease Brother     Died of CAD but had other medical problems  . Diabetes Mother   . Hypertension Mother   . Diabetes Sister   . Diabetes Brother   . Hypertension Brother     History  Substance Use Topics  . Smoking status: Former Smoker -- 1.50 packs/day for 10 years    Types: Cigarettes    Quit date: 06/26/1991  . Smokeless tobacco: Never Used     Comment: Quit 1993  . Alcohol Use: No      Review of Systems All other systems reviewed and are negative Allergies  Review of patient's allergies indicates no known allergies.  Home Medications   Current Outpatient Rx  Name  Route  Sig  Dispense  Refill  . aspirin EC 325 MG tablet   Oral   Take 325 mg by mouth daily.         Marland Kitchen atorvastatin (LIPITOR) 40 MG tablet   Oral   Take 1  tablet (40 mg total) by mouth daily at 6 PM.   30 tablet   0   . clopidogrel (PLAVIX) 75 MG tablet   Oral   Take 75 mg by mouth daily.         . insulin aspart (NOVOLOG) 100 UNIT/ML injection   Subcutaneous   Inject 10 Units into the skin 3 (three) times daily before meals.          . insulin glargine (LANTUS) 100 UNIT/ML injection   Subcutaneous   Inject 30 Units into the skin at bedtime.          . isosorbide mononitrate (IMDUR) 30 MG 24 hr tablet   Oral   Take 30 mg by mouth daily.         . metFORMIN (GLUCOPHAGE) 500 MG tablet   Oral   Take 500 mg by mouth 2 (two) times daily with a meal.         . quinapril (ACCUPRIL) 20 MG tablet   Oral   Take 20 mg by mouth at bedtime.         Marland Kitchen  nitroGLYCERIN (NITROSTAT) 0.4 MG SL tablet   Sublingual   Place 1 tablet (0.4 mg total) under the tongue every 5 (five) minutes as needed for chest pain.   30 tablet   0     BP 105/52  Pulse 58  Resp 16  Ht 6' (1.829 m)  Wt 205 lb (92.987 kg)  BMI 27.8 kg/m2  SpO2 100%  Physical Exam  Nursing note and vitals reviewed. Constitutional: He is oriented to person, place, and time. He appears well-developed and well-nourished. No distress.  HENT:  Head: Normocephalic and atraumatic.  Eyes: Pupils are equal, round, and reactive to light.  Neck: Normal range of motion.  Cardiovascular: Normal rate and intact distal pulses.   Pulmonary/Chest: No respiratory distress.  Abdominal: Normal appearance. He exhibits no distension.  Musculoskeletal: Normal range of motion.  Neurological: He is alert and oriented to person, place, and time. No cranial nerve deficit.  Skin: Skin is warm and dry. No rash noted.  Psychiatric: He has a normal mood and affect. His behavior is normal.    ED Course  Procedures (including critical care time)  Date: 09/04/2012  Rate: 63  Rhythm: normal sinus rhythm  QRS Axis: normal  Intervals: normal  ST/T Wave abnormalities: Nonspecific T wave  abnormalities  Conduction Disutrbances: none  Narrative Interpretation: Abnormal EKG     Labs Reviewed  POCT I-STAT, CHEM 8 - Abnormal; Notable for the following:    Glucose, Bld 120 (*)    All other components within normal limits  PROTIME-INR  POCT I-STAT TROPONIN I   Dg Chest Portable 1 View  09/04/2012  *RADIOLOGY REPORT*  Clinical Data: Shortness of breath.  Left arm and chest pain.  PORTABLE CHEST - 1 VIEW  Comparison: 02/27/2012  Findings: Prior CABG.  Elevation of the right hemidiaphragm.  Low lung volumes.  No focal opacities or effusions.  Heart is borderline in size.  IMPRESSION: Elevation of the right hemidiaphragm.  No acute findings.   Original Report Authenticated By: Charlett Nose, M.D.      1. Chest pain       MDM           Nelia Shi, MD 09/04/12 0981  Nelia Shi, MD 09/04/12 [phone]

## 2012-09-04 NOTE — Progress Notes (Signed)
Utilization Review Completed.   Kimberly Tucker, RN, BSN Nurse Case Manager  [phone]  

## 2012-09-05 DIAGNOSIS — R079 Chest pain, unspecified: Secondary | ICD-10-CM

## 2012-09-05 LAB — TROPONIN I: Troponin I: <0.3 ng/mL (ref <0.30)

## 2012-09-05 LAB — GLUCOSE, CAPILLARY

## 2012-09-05 NOTE — Discharge Summary (Signed)
Internal Medicine Teaching [facility] Discharge Note  Name: Shawn Hood MRN: [id number] DOB: 1955/12/22 57 y.o.  Date of Admission: 09/04/2012  7:01 AM Date of Discharge: 09/05/2012 Attending Physician: Shawn Mage, MD  Discharge Diagnosis: Principal Problem:   Chest pain Active Problems:   HTN (hypertension)   Hyperlipidemia   CAD (coronary artery disease) of artery bypass graft   Diabetes mellitus type 2, uncontrolled, with complications   Discharge Medications:   Medication List    TAKE these medications       aspirin EC 325 MG tablet  Take 325 mg by mouth daily.     atorvastatin 40 MG tablet  Commonly known as:  LIPITOR  Take 1 tablet (40 mg total) by mouth daily at 6 PM.     clopidogrel 75 MG tablet  Commonly known as:  PLAVIX  Take 75 mg by mouth daily.     insulin aspart 100 UNIT/ML injection  Commonly known as:  novoLOG  Inject 10 Units into the skin 3 (three) times daily before meals.     insulin glargine 100 UNIT/ML injection  Commonly known as:  LANTUS  Inject 30 Units into the skin at bedtime.     isosorbide mononitrate 30 MG 24 hr tablet  Commonly known as:  IMDUR  Take 30 mg by mouth daily.     metFORMIN 500 MG tablet  Commonly known as:  GLUCOPHAGE  Take 500 mg by mouth 2 (two) times daily with a meal.     nitroGLYCERIN 0.4 MG SL tablet  Commonly known as:  NITROSTAT  Place 1 tablet (0.4 mg total) under the tongue every 5 (five) minutes as needed for chest pain.     quinapril 20 MG tablet  Commonly known as:  ACCUPRIL  Take 20 mg by mouth at bedtime.        Disposition and follow-up:   [name] was discharged from [facility] in Stable condition.  At the hospital follow up visit please address: -Compliance to his medications -Consider outpatient stress test for further work up of his chest pain    Follow-up Appointments:     Follow-up Information   Follow up with Shawn Hopes, MD On 09/15/2012.  (PRIMARY CARE PHYSICIAN - Your appointment is on 09/15/2012 at 10:00AM.)    Contact information:   Shawn Hood [facility] [address] [place] [address] [phone]       Follow up with Shawn Rotunda, MD On 09/18/2012. (CARDIOLOGY - Your appointment is on 08/29/2012 at 3:00PM - Please arrive NO LATER THAN 2:45PM)    Contact information:   [address] [place] [address] [phone]      Discharge Orders   Future Appointments Provider Department Dept Phone   09/18/2012 3:00 PM Shawn Rotunda, MD [facility] Main Office [place]) [phone]   Future Orders Complete By Expires     Diet - low sodium heart healthy  As directed     Increase activity slowly  As directed        Consultations: None  Procedures Performed:  Dg Chest Portable 1 View  09/04/2012  *RADIOLOGY REPORT*  Clinical Data: Shortness of breath.  Left arm and chest pain.  PORTABLE CHEST - 1 VIEW  Comparison: 02/27/2012  Findings: Prior CABG.  Elevation of the right hemidiaphragm.  Low lung volumes.  No focal opacities or effusions.  Heart is borderline in size.  IMPRESSION: Elevation of the right hemidiaphragm.  No acute findings.   Original Report Authenticated By: Shawn Hood, M.D.     Admission HPI:  Patient is a 57 y.o. male with a PMHx of CAD s/p CABG (2002) and DES to SVG-OM (02/2012 - for 99% occlusion in setting of UA) , uncontrolled DM2, HTN, and HLD who presents to [facility] for evaluation of chest pain. Patient indicates acute onset chest heaviness over his left chest that awoke him from sleep at 5:30AM. He had radiation of symptoms to his left arm, and subsequently experienced left arm numbness. During this episode, the patient felt excessively nauseous and tachycardic, however remained without symptoms of diaphoresis or palpitations. On, he administered aspirin 325mg  x 2, called 911, and had complete cessation of the chest heaviness within  10 minutes (prior to ED arrival). Not no any specific aggravating factors during this episode, specifically did not have worsening of symptoms when walking around his house. He did not attempt to take his nitroglycerin. He has not had recurrence of the pain since initial episode.  The patient notes this chest heaviness episode to be similar to sx experienced with his last NSTEMI in 02/2012, at which time he had the SVG-OM DES placement and was noted to have diffuse coronary disease. Recently, the patient has been able to participate in his usual activities without chest pain or SOB symptoms, is able to play with his granddaugther without issue. States all of his medications regularly without missing doses. He has not recently been ill, has not had any direct trauma to the chest (no recent car accidents, falls, etc, heavy lifting, etc).   Hospital Course by problem list: Chest pain - No recurrence during his hospitalization. Likely vasospasm from SVG with DES. His troponin x3 is negative, his EKG has no changes. LDL of 69, at goal. Hgb A1c at 7.5%, TSH normal at 3.104. We continued his Plavix, ASA, ACEi, long-acting nitrate. He did not need nitroglycerin PRN. Of note, he is not on a beta blocker due to his bradycardia. He will be discharge with follow up with Cardiology and will most likely need outpatient stress test.   DM2, uncontrolled with complications - Much improved control since his last cardiac cath. Hgb A1C of 7.5% but was 14. 2% in 9/13. His home regimen comprises of metformin 500mg  BID, Lantus 30 units,  and Humalog 10 units TID. We continued Lantus 30 units qHS with SSI during his hospitalization but he will resume his prior to admission DM regimen upon his discharge.   HLD - LDL much improved, at goal of <70. LDL of 152 prior to atorvastatin therapy. We continued statin therapy during this hospitalization and he will resume Atorvastatin upon his discharge.   HTN - Stable on admission,  although most recent levels slightly low prompting hold on accupril. BP stable at 122/70 prior to his discharge. He will resume Imdur and Accupril upon his discharge. No BB in setting of bradycardia.   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 We continued his aspirin and statin per above.   Sinus bradycardia - Chronic and unchanged, no AV blocks noted. TSH normal.    Discharge Vitals:  BP 128/71  Pulse 59  Temp(Src) 97.9 F (36.6 C) (Oral)  Resp 20  Ht 6' (1.829 m)  Wt 207 lb 3.7 oz (94 kg)  BMI 28.1 kg/m2  SpO2 98%  Discharge Labs:  Results for orders placed during the hospital encounter of 09/04/12 (from the past 24 hour(s))  TROPONIN I     Status: None   Collection Time    09/04/12  3:29 PM      Result Value Range   Troponin I <0.30  <0.30 ng/mL  GLUCOSE, CAPILLARY     Status: Abnormal   Collection Time    09/04/12  4:05 PM      Result Value Range   Glucose-Capillary 123 (*) 70 - 99 mg/dL   Comment 1 Notify RN    GLUCOSE, CAPILLARY     Status: Abnormal   Collection Time    09/04/12  9:16 PM      Result Value Range   Glucose-Capillary 170 (*) 70 - 99 mg/dL  TROPONIN I     Status: None   Collection Time    09/04/12  9:25 PM      Result Value Range   Troponin I <0.30  <0.30 ng/mL  TROPONIN I     Status: None   Collection Time    09/05/12  2:31 AM      Result Value Range   Troponin I <0.30  <0.30 ng/mL  GLUCOSE, CAPILLARY     Status: None   Collection Time    09/05/12  6:51 AM      Result Value Range   Glucose-Capillary 89  70 - 99 mg/dL   Comment 1 Notify RN    TROPONIN I     Status: None   Collection Time    09/05/12  9:59 AM      Result Value Range   Troponin I <0.30  <0.30 ng/mL  GLUCOSE, CAPILLARY     Status: Abnormal   Collection Time    09/05/12 11:33 AM      Result Value Range   Glucose-Capillary 147 (*) 70 - 99 mg/dL   Comment 1 Notify RN     Comment 2 Documented in Chart      Signed: Ky Barban 09/05/2012, 2:16 PM   Time Spent on Discharge: 35 minutes Services Ordered on Discharge: None Equipment Ordered on Discharge: None

## 2012-09-05 NOTE — H&P (Signed)
Internal Medicine Attending Admission Note Date: 09/05/2012  Patient name: Shawn Hood Medical record number: [id number] Date of birth: 08-21-1955 Age: 57 y.o. Gender: male  I saw and evaluated the patient. I reviewed the resident's note and I agree with the resident's findings and plan as documented in the resident's note.  Chief Complaint(s): Chest pain  History - key components related to admission: Patient is a 57 year old man with past medical history most significant for coronary artery disease status post CABG in 2002 and non-ST elevation MI in September 2013 for which he received a drug-eluting stent in the saphenous vein graft to ostial marginal, diabetes type 2, hypertension and hyperlipidemia who was admitted to ER with chief complaints of chest pain which woke him up from sleep at about 5:30 in the morning. The pain was described as severe heaviness present in the middle of the chest radiating to the left arm and similar to the pain that he had back in September when he was diagnosed with NSTEMI. Patient also felt nauseous and palpitations. The whole episode lasted about 10-15 minutes. No exacerbating factors. Relieved with aspirin x2. Denies any shortness of breath, sweating, headaches.  14 point review of system was negative otherwise except what is noted above.  Past medical history, past surgical history, medications, social history and family history was reviewed and as per resident's note.  Physical Exam - key components related to admission:  Filed Vitals:   09/04/12 2056 09/05/12 0136 09/05/12 0600 09/05/12 1001  BP: 114/56 119/64 120/68 122/70  Pulse: 59 61 57 56  Temp: 98.6 F (37 C) 98.3 F (36.8 C) 98.2 F (36.8 C)   TempSrc: Oral Oral Oral   Resp: 19 18 18 16   Height:      Weight:   207 lb 3.7 oz (94 kg)   SpO2: 100% 99% 100% 100%  Physical Exam: General: Vital signs reviewed and noted. Well-developed, well-nourished, in no acute distress; alert, appropriate and  cooperative throughout examination.  Head: Normocephalic, atraumatic.  Eyes: PERRL, EOMI, No signs of anemia or jaundince.  Nose: Mucous membranes moist, not inflammed, nonerythematous.  Throat: Oropharynx nonerythematous, no exudate appreciated.   Neck: No deformities, masses, or tenderness noted.Supple, No carotid Bruits, no JVD.  Lungs:  Normal respiratory effort. Clear to auscultation BL without crackles or wheezes.  Heart:  bradycardic regular rhythm. S1 and S2 normal without gallop, murmur, or rubs.  Abdomen:  BS normoactive. Soft, Nondistended, non-tender.  No masses or organomegaly.  Extremities: No pretibial edema.  Neurologic: A&O X3, CN II - XII are grossly intact. Motor strength is 5/5 in the all 4 extremities, Sensations intact to light touch, Cerebellar signs negative.  Skin: No visible rashes     Lab results:   Basic Metabolic Panel:  Recent Labs  21/30/86 0812 09/04/12 1018  NA 144 139  K 3.6 3.9  CL 106 106  CO2  --  25  GLUCOSE 120* 111*  BUN 19 19  CREATININE 0.80 0.80  CALCIUM  --  9.1  MG  --  2.0   Liver Function Tests:  Recent Labs  09/04/12 1018  AST 19  ALT 18  ALKPHOS 89  BILITOT 0.5  PROT 7.6  ALBUMIN 3.6   CBC:  Recent Labs  09/04/12 0812 09/04/12 1018  WBC  --  4.1  NEUTROABS  --  1.8  HGB 13.9 13.3  HCT 41.0 38.4*  MCV  --  80.5  PLT  --  222   Cardiac Enzymes:  Recent Labs  09/04/12 2125 09/05/12 0231 09/05/12 0959  TROPONINI <0.30 <0.30 <0.30   CBG:  Recent Labs  09/04/12 1319 09/04/12 1605 09/04/12 2116 09/05/12 0651 09/05/12 1133  GLUCAP 91 123* 170* 89 147*   Hemoglobin A1C:  Recent Labs  09/04/12 1018  HGBA1C 7.5*   Fasting Lipid Panel:  Recent Labs  09/04/12 1024  CHOL 113  HDL 37*  LDLCALC 69  TRIG 36  CHOLHDL 3.1   Thyroid Function Tests:  Recent Labs  09/04/12 1018  TSH 3.104   Coagulation:  Recent Labs  09/04/12 0740  INR 0.92   Imaging results:  Dg Chest Portable 1  View  09/04/2012  *RADIOLOGY REPORT*  Clinical Data: Shortness of breath.  Left arm and chest pain.  PORTABLE CHEST - 1 VIEW  Comparison: 02/27/2012  Findings: Prior CABG.  Elevation of the right hemidiaphragm.  Low lung volumes.  No focal opacities or effusions.  Heart is borderline in size.  IMPRESSION: Elevation of the right hemidiaphragm.  No acute findings.   Original Report Authenticated By: Charlett Nose, M.D.     Other results: EKG:63 beats per minute, left axis deviation, sinus rhythm, nonspecific T wave changes in lateral leads which were seen on EKG as well.  Assessment & Plan by Problem:  Principal Problem:   Chest pain Active Problems:   HTN (hypertension)   Hyperlipidemia   CAD (coronary artery disease) of artery bypass graft   Diabetes mellitus type 2, uncontrolled, with complications  Shawn Hood is a 57 year old man with past medical history most significant for diabetes, hypertension, hyperlipidemia, coronary artery disease status post CABG and angioplasty who had an acute onset chest pain which was described as angina. Patient's troponins and EKG have been negative so far.  Given that patient has not had any chest pain since the episode yesterday morning, no EKG changes, troponins are negative I believe that patient is stable to discharge home. Patient has been given an appointment with cardiology on March 28 and would most likely need a stress test. Patient is on optimal medical therapy with aspirin, Plavix, statin, ACE inhibitor and long-acting nitrate. Patient cannot be on a beta blocker due to his bradycardia.  No changes were made to his medication list.  Rest of the medical management as per resident's.  Lars Mage MD Faculty-Internal Medicine Residency Program

## 2012-09-05 NOTE — Progress Notes (Signed)
Discharge instructions given to pt and spouse.  Verbalized understanding.

## 2012-09-05 NOTE — Progress Notes (Signed)
Subjective: No chest pain, shortness of breath, or dizziness.   Objective: Vital signs in last 24 hours: Filed Vitals:   09/04/12 2056 09/05/12 0136 09/05/12 0600 09/05/12 1001  BP: 114/56 119/64 120/68 122/70  Pulse: 59 61 57 56  Temp: 98.6 F (37 C) 98.3 F (36.8 C) 98.2 F (36.8 C)   TempSrc: Oral Oral Oral   Resp: 19 18 18 16   Height:      Weight:   207 lb 3.7 oz (94 kg)   SpO2: 100% 99% 100% 100%   Weight change:   Intake/Output Summary (Last 24 hours) at 09/05/12 1120 Last data filed at 09/05/12 0811  Gross per 24 hour  Intake    340 ml  Output    300 ml  Net     40 ml   Vitals reviewed. General: resting in bed, in NAD HEENT: PERRL, EOMI, no scleral icterus Cardiac: RRR, no rubs, murmurs or gallops.  Chest: Well healed midline surgical scar for CABG Pulm: clear to auscultation bilaterally, no wheezes, rales, or rhonchi Abd: soft, nontender, nondistended, BS present Ext: warm and well perfused, no pedal edema Neuro: alert and oriented X3, cranial nerves II-XII grossly intact, strength and sensation to light touch equal in bilateral upper and lower extremities  Lab Results: Basic Metabolic Panel:  Recent Labs Lab 09/04/12 0812 09/04/12 1018  NA 144 139  K 3.6 3.9  CL 106 106  CO2  --  25  GLUCOSE 120* 111*  BUN 19 19  CREATININE 0.80 0.80  CALCIUM  --  9.1  MG  --  2.0   Liver Function Tests:  Recent Labs Lab 09/04/12 1018  AST 19  ALT 18  ALKPHOS 89  BILITOT 0.5  PROT 7.6  ALBUMIN 3.6   CBC:  Recent Labs Lab 09/04/12 0812 09/04/12 1018  WBC  --  4.1  NEUTROABS  --  1.8  HGB 13.9 13.3  HCT 41.0 38.4*  MCV  --  80.5  PLT  --  222   Cardiac Enzymes:  Recent Labs Lab 09/04/12 2125 09/05/12 0231 09/05/12 0959  TROPONINI <0.30 <0.30 <0.30   CBG:  Recent Labs Lab 09/04/12 1319 09/04/12 1605 09/04/12 2116 09/05/12 0651  GLUCAP 91 123* 170* 89   Hemoglobin A1C:  Recent Labs Lab 09/04/12 1018  HGBA1C 7.5*   Fasting  Lipid Panel:  Recent Labs Lab 09/04/12 1024  CHOL 113  HDL 37*  LDLCALC 69  TRIG 36  CHOLHDL 3.1   Thyroid Function Tests:  Recent Labs Lab 09/04/12 1018  TSH 3.104   Coagulation:  Recent Labs Lab 09/04/12 0740  LABPROT 12.3  INR 0.92    Studies/Results: Dg Chest Portable 1 View  09/04/2012  *RADIOLOGY REPORT*  Clinical Data: Shortness of breath.  Left arm and chest pain.  PORTABLE CHEST - 1 VIEW  Comparison: 02/27/2012  Findings: Prior CABG.  Elevation of the right hemidiaphragm.  Low lung volumes.  No focal opacities or effusions.  Heart is borderline in size.  IMPRESSION: Elevation of the right hemidiaphragm.  No acute findings.   Original Report Authenticated By: Charlett Nose, M.D.    Medications: I have reviewed the patient's current medications. Scheduled Meds: . aspirin EC  325 mg Oral Daily  . atorvastatin  40 mg Oral q1800  . clopidogrel  75 mg Oral Daily  . enoxaparin (LOVENOX) injection  40 mg Subcutaneous Q24H  . insulin aspart  0-9 Units Subcutaneous TID WC  . insulin glargine  30 Units  Subcutaneous QHS  . isosorbide mononitrate  30 mg Oral Daily  . sodium chloride  3 mL Intravenous Q12H   Continuous Infusions:  PRN Meds:.sodium chloride, acetaminophen, nitroGLYCERIN, ondansetron (ZOFRAN) IV, sodium chloride Assessment/Plan: Pt is a 57 y.o. yo male with a PMHX of severe CAD s/p CABG and PTCA and DES to saphenous vein graft (02/2012), who presented to [facility] on 09/04/2012 with symptoms of left chest heaviness with radiation to left arm. Admission EKG showing chronic TWI in lateral leads and initial troponins of < 0.3. Symptoms are of his anginal equivalent during prior NSTEMI.   Chest pain - Resolved now. Likely vasospasm from SVG with DES. His troponin x3 is negative, his EKG has no changes. LDL at 69, at goal. Hgb A1c at 7.5%, TSH normal at 3.104.  - Continue aspirin, statin therapy.  - No beta blocker 2/2 bradycardia.  - Continue morphine, oxygen, PRN  nitroglycerin.  - He will need stress test as outpatient and follow up with Cardiology  DM2, uncontrolled with complications - Hgb A1C of 7.5% today was 14. 2% in 9/13. Home regimen of Lantus 30 units and Humalog 10 units TID.  - Will continue Lantus but not Humalog   - SSI.  - Carb mod diet.   HLD - last LDL of 152, not at goal for CAD and DM2 - Atorvastatin was added during last admission in response to this value. LDL today at goal at 69.  - Continue Atorvastatin.     HTN - stable on admission, although most recent levels slightly low. BP stable at 122/70 today.  - Will continue imdur with hold parameters.  - Hold accupril for now, until stabilization of BP.  - No BB in setting of bradycardia.   History of CVA - with residual mild sensory impairment on left side.  - Continue aspirin, statin as above.   Sinus bradycardia - chronic and unchanged, no AV blocks noted. TSH normal - Monitor on tele.    DVT prophylaxis: Lovenox  FEN NSL Replete as needed Carb mod  DISPO - Disposition is deferred at this time, awaiting evaluation of chest pain. Anticipated discharge in approximately today.  The patient does have a current PCP (Harriett Pietro Cassis, MD), and does not need OPC follow-up after discharge.  Lastly, the patient does not have transportation limitations that hinder transportation to clinic appointments.  .Services Needed at time of discharge: Y = Yes, Blank = No PT:   OT:   RN:   Equipment:   Other:     LOS: 1 day   Sara Chu D 09/05/2012, 11:20 AM

## 2012-09-18 ENCOUNTER — Encounter: Payer: Self-pay | Admitting: Cardiology

## 2012-09-18 ENCOUNTER — Ambulatory Visit (INDEPENDENT_AMBULATORY_CARE_PROVIDER_SITE_OTHER): Payer: Medicare Other | Admitting: Cardiology

## 2012-09-18 VITALS — BP 132/84 | HR 53 | Ht 72.0 in | Wt 214.0 lb

## 2012-09-18 DIAGNOSIS — E118 Type 2 diabetes mellitus with unspecified complications: Secondary | ICD-10-CM

## 2012-09-18 DIAGNOSIS — I25810 Atherosclerosis of coronary artery bypass graft(s) without angina pectoris: Secondary | ICD-10-CM

## 2012-09-18 DIAGNOSIS — I10 Essential (primary) hypertension: Secondary | ICD-10-CM

## 2012-09-18 DIAGNOSIS — E785 Hyperlipidemia, unspecified: Secondary | ICD-10-CM

## 2012-09-18 DIAGNOSIS — Z8673 Personal history of transient ischemic attack (TIA), and cerebral infarction without residual deficits: Secondary | ICD-10-CM

## 2012-09-18 DIAGNOSIS — E1165 Type 2 diabetes mellitus with hyperglycemia: Secondary | ICD-10-CM

## 2012-09-18 NOTE — Patient Instructions (Addendum)
Your physician recommends that you continue on your current medications as directed. Please refer to the Current Medication list given to you today. Your physician wants you to follow-up in: 4 months You will receive a reminder letter in the mail two months in advance. If you don't receive a letter, please call our office to schedule the follow-up appointment.  

## 2012-09-18 NOTE — Progress Notes (Signed)
HPI The patient presents for followup of chest pain. He was recently hospitalized with chest discomfort. However, he ruled out for myocardial infarction. He was set up here for followup. Since that time he has had no further chest discomfort. He says he's been walking routinely. With this level of activity he's not bringing on any of his previous chest pressure, neck or arm discomfort. He's not getting any of the numbness or heaviness in his arm that was his previous angina. He denies any shortness of breath, PND or orthopnea. He's had no palpitations, presyncope or syncope.   No Known Allergies  Current Outpatient Prescriptions  Medication Sig Dispense Refill  . aspirin EC 325 MG tablet Take 325 mg by mouth daily.      Marland Kitchen atorvastatin (LIPITOR) 40 MG tablet Take 1 tablet (40 mg total) by mouth daily at 6 PM.  30 tablet  0  . clopidogrel (PLAVIX) 75 MG tablet Take 75 mg by mouth daily.      . insulin aspart (NOVOLOG) 100 UNIT/ML injection Inject 10 Units into the skin 3 (three) times daily before meals.       . insulin glargine (LANTUS) 100 UNIT/ML injection Inject 30 Units into the skin at bedtime.       . metFORMIN (GLUCOPHAGE) 500 MG tablet Take 500 mg by mouth 2 (two) times daily with a meal.      . nitroGLYCERIN (NITROSTAT) 0.4 MG SL tablet Place 1 tablet (0.4 mg total) under the tongue every 5 (five) minutes as needed for chest pain.  30 tablet  0  . quinapril (ACCUPRIL) 20 MG tablet Take 20 mg by mouth at bedtime.       No current facility-administered medications for this visit.    Past Medical History  Diagnosis Date  . Diabetes mellitus type 2, uncontrolled, with complications DX: Age 67  . Hypertension   . Coronary artery disease     a. s/p 3-vessel CABG 2002. b. NSTEMI 09/2010 with patent grafts, no obvious culprit, for med rx.  c. DES to SVG-OM, severe diffuse disease per [facility]  . Hyperlipidemia   . 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  .  Sinus bradycardia   . Myocardial infarction 2002  . Shortness of breath   . Stroke 2006 ?    left arm & leg    Past Surgical History  Procedure Laterality Date  . Coronary artery bypass graft  2002  . Shoulder surgery Left   . Cholecystectomy    . Coronary angioplasty with stent placement  2013    ROS:  As stated in the HPI and negative for all other systems.  PHYSICAL EXAM BP 132/84  Pulse 53  Ht 6' (1.829 m)  Wt 214 lb (97.07 kg)  BMI 29.02 kg/m2  SpO2 98% GENERAL:  Well appearing NECK:  No jugular venous distention, waveform within normal limits, carotid upstroke brisk and symmetric, no bruits, no thyromegaly LUNGS:  Clear to auscultation bilaterally BACK:  No CVA tenderness CHEST:  Well healed sternotomy scar. HEART:  PMI not displaced or sustained,S1 and S2 within normal limits, no S3, no S4, no clicks, no rubs, no murmurs ABD:  Flat, positive bowel sounds normal in frequency in pitch, no bruits, no rebound, no guarding, no midline pulsatile mass, no hepatomegaly, no splenomegaly EXT:  2 plus pulses throughout, no edema, no cyanosis no clubbing   ASSESSMENT AND PLAN  CAD:  The patient has  no new sypmtoms.  No further cardiovascular testing is indicated.  We will continue with aggressive risk reduction and meds as listed.  We reviewed the recent catheterization and PCI in 9/13.  I do think that given his previous diffuse disease and previous chest pain he should remain on the Plavix in addition to aspirin.  DM:  I reviewed with him that his hemoglobin A1c was slightly elevated in the hospital at 7.5. He will followup with his primary provider.  HTN:  The blood pressure is at target. No change in medications is indicated. We will continue with therapeutic lifestyle changes (TLC).  HYPERLIPIDEMIA:  His LDL is 69 and HDL 36. He will continue on the meds as listed.

## 2013-03-26 ENCOUNTER — Encounter (HOSPITAL_COMMUNITY): Payer: Self-pay | Admitting: *Deleted

## 2013-03-26 ENCOUNTER — Observation Stay (HOSPITAL_COMMUNITY): Payer: Medicare Other

## 2013-03-26 ENCOUNTER — Emergency Department (HOSPITAL_COMMUNITY): Payer: Medicare Other

## 2013-03-26 ENCOUNTER — Inpatient Hospital Stay (HOSPITAL_COMMUNITY)
Admission: EM | Admit: 2013-03-26 | Discharge: 2013-03-31 | DRG: 038 | Disposition: A | Discharge status: Home / Self Care | Payer: Medicare Other | Attending: Family Medicine | Admitting: Family Medicine

## 2013-03-26 DIAGNOSIS — I69998 Other sequelae following unspecified cerebrovascular disease: Secondary | ICD-10-CM

## 2013-03-26 DIAGNOSIS — Z951 Presence of aortocoronary bypass graft: Secondary | ICD-10-CM

## 2013-03-26 DIAGNOSIS — Z87891 Personal history of nicotine dependence: Secondary | ICD-10-CM

## 2013-03-26 DIAGNOSIS — G8190 Hemiplegia, unspecified affecting unspecified side: Secondary | ICD-10-CM | POA: Diagnosis present

## 2013-03-26 DIAGNOSIS — E119 Type 2 diabetes mellitus without complications: Secondary | ICD-10-CM | POA: Diagnosis present

## 2013-03-26 DIAGNOSIS — I25810 Atherosclerosis of coronary artery bypass graft(s) without angina pectoris: Secondary | ICD-10-CM

## 2013-03-26 DIAGNOSIS — I2510 Atherosclerotic heart disease of native coronary artery without angina pectoris: Secondary | ICD-10-CM | POA: Diagnosis present

## 2013-03-26 DIAGNOSIS — E785 Hyperlipidemia, unspecified: Secondary | ICD-10-CM | POA: Diagnosis present

## 2013-03-26 DIAGNOSIS — Z9861 Coronary angioplasty status: Secondary | ICD-10-CM

## 2013-03-26 DIAGNOSIS — R001 Bradycardia, unspecified: Secondary | ICD-10-CM | POA: Diagnosis not present

## 2013-03-26 DIAGNOSIS — Z8673 Personal history of transient ischemic attack (TIA), and cerebral infarction without residual deficits: Secondary | ICD-10-CM

## 2013-03-26 DIAGNOSIS — I10 Essential (primary) hypertension: Secondary | ICD-10-CM | POA: Diagnosis present

## 2013-03-26 DIAGNOSIS — IMO0002 Reserved for concepts with insufficient information to code with codable children: Secondary | ICD-10-CM | POA: Diagnosis present

## 2013-03-26 DIAGNOSIS — I6522 Occlusion and stenosis of left carotid artery: Secondary | ICD-10-CM

## 2013-03-26 DIAGNOSIS — I498 Other specified cardiac arrhythmias: Secondary | ICD-10-CM | POA: Diagnosis present

## 2013-03-26 DIAGNOSIS — E1165 Type 2 diabetes mellitus with hyperglycemia: Secondary | ICD-10-CM

## 2013-03-26 DIAGNOSIS — G459 Transient cerebral ischemic attack, unspecified: Secondary | ICD-10-CM | POA: Diagnosis present

## 2013-03-26 DIAGNOSIS — I63239 Cerebral infarction due to unspecified occlusion or stenosis of unspecified carotid arteries: Principal | ICD-10-CM | POA: Diagnosis present

## 2013-03-26 LAB — GLUCOSE, CAPILLARY: Glucose-Capillary: 267 mg/dL — ABNORMAL HIGH (ref 70–99)

## 2013-03-26 LAB — COMPREHENSIVE METABOLIC PANEL
ALT: 18 U/L (ref 0–53)
AST: 20 U/L (ref 0–37)
Alkaline Phosphatase: 102 U/L (ref 39–117)
CO2: 24 mEq/L (ref 19–32)
Chloride: 101 mEq/L (ref 96–112)
GFR calc Af Amer: >90 mL/min (ref >90)
GFR calc non Af Amer: >90 mL/min (ref >90)
Glucose, Bld: 89 mg/dL (ref 70–99)
Potassium: 3.3 mEq/L — ABNORMAL LOW (ref 3.5–5.1)
Sodium: 137 mEq/L (ref 135–145)

## 2013-03-26 LAB — CBC
HCT: 35.5 % — ABNORMAL LOW (ref 39.0–52.0)
MCHC: 35.2 g/dL (ref 30.0–36.0)
MCV: 80.8 fL (ref 78.0–100.0)
MCV: 81.4 fL (ref 78.0–100.0)
Platelets: 200 10*3/uL (ref 150–400)
RBC: 4.73 MIL/uL (ref 4.22–5.81)
RBC: 4.36 MIL/uL (ref 4.22–5.81)
WBC: 3.7 10*3/uL — ABNORMAL LOW (ref 4.0–10.5)
WBC: 4.6 10*3/uL (ref 4.0–10.5)

## 2013-03-26 LAB — DIFFERENTIAL
Lymphocytes Relative: 60 % — ABNORMAL HIGH (ref 12–46)
Lymphs Abs: 2.2 10*3/uL (ref 0.7–4.0)
Neutro Abs: 1.1 10*3/uL — ABNORMAL LOW (ref 1.7–7.7)
Neutrophils Relative %: 30 % — ABNORMAL LOW (ref 43–77)

## 2013-03-26 LAB — POCT I-STAT TROPONIN I

## 2013-03-26 LAB — APTT: aPTT: 27 seconds (ref 24–37)

## 2013-03-26 LAB — CREATININE, SERUM
Creatinine, Ser: 0.96 mg/dL (ref 0.50–1.35)
GFR calc Af Amer: >90 mL/min (ref >90)

## 2013-03-26 LAB — PROTIME-INR
INR: 1.04 (ref 0.00–1.49)
Prothrombin Time: 13.4 seconds (ref 11.6–15.2)

## 2013-03-26 MED ORDER — CLOPIDOGREL BISULFATE 75 MG PO TABS
75.0000 mg | ORAL_TABLET | Freq: Every day | ORAL | Status: DC
  Administered 2013-03-27: 75 mg via ORAL
  Filled 2013-03-26 (×2): qty 1

## 2013-03-26 MED ORDER — LISINOPRIL 20 MG PO TABS
20.0000 mg | ORAL_TABLET | Freq: Every day | ORAL | Status: DC
  Administered 2013-03-26 – 2013-03-31 (×5): 20 mg via ORAL
  Filled 2013-03-26 (×6): qty 1

## 2013-03-26 MED ORDER — SENNOSIDES-DOCUSATE SODIUM 8.6-50 MG PO TABS
1.0000 | ORAL_TABLET | Freq: Every evening | ORAL | Status: DC | PRN
  Filled 2013-03-26: qty 1

## 2013-03-26 MED ORDER — ASPIRIN EC 325 MG PO TBEC
325.0000 mg | DELAYED_RELEASE_TABLET | Freq: Every day | ORAL | Status: DC
  Administered 2013-03-27 – 2013-03-29 (×3): 325 mg via ORAL
  Filled 2013-03-26 (×5): qty 1

## 2013-03-26 MED ORDER — LORAZEPAM 0.5 MG PO TABS
0.5000 mg | ORAL_TABLET | Freq: Once | ORAL | Status: AC
  Administered 2013-03-26: 0.5 mg via ORAL
  Filled 2013-03-26: qty 1

## 2013-03-26 MED ORDER — CLOPIDOGREL BISULFATE 75 MG PO TABS: 75.0000 mg | ORAL_TABLET | Freq: Every day | ORAL | Status: DC

## 2013-03-26 MED ORDER — HEPARIN SODIUM (PORCINE) 5000 UNIT/ML IJ SOLN
5000.0000 [IU] | Freq: Three times a day (TID) | INTRAMUSCULAR | Status: DC
  Administered 2013-03-26 – 2013-03-28 (×5): 5000 [IU] via SUBCUTANEOUS
  Filled 2013-03-26 (×8): qty 1

## 2013-03-26 MED ORDER — ATORVASTATIN CALCIUM 80 MG PO TABS
80.0000 mg | ORAL_TABLET | Freq: Every day | ORAL | Status: DC
  Administered 2013-03-26 – 2013-03-30 (×5): 80 mg via ORAL
  Filled 2013-03-26 (×7): qty 1

## 2013-03-26 MED ORDER — INSULIN ASPART 100 UNIT/ML ~~LOC~~ SOLN
0.0000 [IU] | Freq: Three times a day (TID) | SUBCUTANEOUS | Status: DC
  Administered 2013-03-27 (×2): 3 [IU] via SUBCUTANEOUS
  Administered 2013-03-28: 5 [IU] via SUBCUTANEOUS
  Administered 2013-03-28 – 2013-03-29 (×2): 3 [IU] via SUBCUTANEOUS
  Administered 2013-03-29: 5 [IU] via SUBCUTANEOUS
  Administered 2013-03-31: 2 [IU] via SUBCUTANEOUS
  Administered 2013-03-31: 3 [IU] via SUBCUTANEOUS

## 2013-03-26 MED ORDER — QUINAPRIL HCL 10 MG PO TABS: 20.0000 mg | ORAL_TABLET | Freq: Every day | ORAL | Status: DC

## 2013-03-26 MED ORDER — SODIUM CHLORIDE 0.9 % IV SOLN
INTRAVENOUS | Status: DC
  Administered 2013-03-26: 1000 mL via INTRAVENOUS
  Administered 2013-03-30: 75 mL/h via INTRAVENOUS

## 2013-03-26 MED ORDER — INSULIN ASPART 100 UNIT/ML ~~LOC~~ SOLN
0.0000 [IU] | Freq: Every day | SUBCUTANEOUS | Status: DC
  Administered 2013-03-26: 3 [IU] via SUBCUTANEOUS

## 2013-03-26 NOTE — Progress Notes (Signed)
RN attempted to call report to ED, placed on hold for 5 minutes, will try back later  Mikaiya Tramble, [place], RN 6:27 PM 03/26/2013

## 2013-03-26 NOTE — Consult Note (Signed)
Referring Physician: Dr. Deirdre Priest    Chief Complaint: Transient numbness involving right side of her face as well as weakness and coordination difficulty of right upper extremity.  HPI: Shawn Hood is an 57 y.o. male with a history of diabetes mellitus, hypertension, hyperlipidemia, previous stroke and coronary artery disease, presenting with history of transient numbness involving right upper extremity and right side of the face as well as weakness and coordination abnormality of right upper extremity. Initial symptoms occurred last night at about 9 PM and lasted about 15 minutes involving numbness of his right hand. It recurrent numbness at 9 AM today involving right upper extremity and right side of his face. He also noticed difficulty lifting his right arm as well as coordination difficulty of his right upper extremity. Symptoms again lasted about 15 minutes then resolved completely. CT scan of his head showed no acute intracranial abnormality. Patient has been taking aspirin and Plavix daily. NIH stroke score at this point is 0. He was last known well at 9 AM today.  LSN: 9 AM on 03/26/2013 tPA Given: No: Deficits resolved MRankin: 0  Past Medical History  Diagnosis Date  . Diabetes mellitus type 2, uncontrolled, with complications DX: Age 3  . Hypertension   . Coronary artery disease     Hera. s/p 3-vessel CABG 2002. b NSTEMI 09/2010 with patent grafts, no obvious culprit, for med rx.  c. DES to SVG-OM, severe diffuse disease per [facility].  c.  DES to SVG to OM  9/13  . Hyperlipidemia   . 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  . Sinus bradycardia   . Myocardial infarction 2002  . Shortness of breath   . Stroke 2006 ?    left arm & leg    Family History  Problem Relation Age of Onset  . Coronary artery disease Mother     Died at 4 of MI  . Coronary artery disease Brother     Died of CAD but had other medical problems  . Diabetes Mother   .  Hypertension Mother   . Diabetes Sister   . Diabetes Brother   . Hypertension Brother      Medications: I have reviewed the patient's current medications.  ROS: History obtained from the patient  General ROS: negative for - chills, fatigue, fever, night sweats, weight gain or weight loss Psychological ROS: negative for - behavioral disorder, hallucinations, memory difficulties, mood swings or suicidal ideation Ophthalmic ROS: negative for - blurry vision, double vision, eye pain or loss of vision ENT ROS: negative for - epistaxis, nasal discharge, oral lesions, sore throat, tinnitus or vertigo Allergy and Immunology ROS: negative for - hives or itchy/watery eyes Hematological and Lymphatic ROS: negative for - bleeding problems, bruising or swollen lymph nodes Endocrine ROS: negative for - galactorrhea, hair pattern changes, polydipsia/polyuria or temperature intolerance Respiratory ROS: negative for - cough, hemoptysis, shortness of breath or wheezing Cardiovascular ROS: negative for - chest pain, dyspnea on exertion, edema or irregular heartbeat Gastrointestinal ROS: negative for - abdominal pain, diarrhea, hematemesis, nausea/vomiting or stool incontinence Genito-Urinary ROS: negative for - dysuria, hematuria, incontinence or urinary frequency/urgency Musculoskeletal ROS: negative for - joint swelling or muscular weakness Neurological ROS: as noted in HPI Dermatological ROS: negative for rash and skin lesion changes  Physical Examination: Blood pressure 127/70, pulse 44, temperature 98.2 F (36.8 C), temperature source Oral, resp. rate 17, height 5\' 11"  (1.803 m), weight 94.802 kg (209  lb), SpO2 99.00%.  Neurologic Examination: Mental Status: Alert, oriented, thought content appropriate.  Speech fluent without evidence of aphasia. Able to follow commands without difficulty. Cranial Nerves: II-Visual fields were normal. III/IV/VI-Pupils were equal and reacted. Extraocular  movements were full and conjugate.    V/VII-no facial numbness and no facial weakness. VIII-normal. X-normal speech and symmetrical palatal movement. Motor: Slightly reduced hand grip on the left as well as mild left hip flexor weakness (residual from previous stroke); motor exam otherwise unremarkable Sensory: Reduced perception of touch as well as vibration below the knees bilaterally. Deep Tendon Reflexes: Asymmetric with slightly greater responses elicited from left extremities compared to right extremities; right ankle jerk was absent. Plantars: Extensor on the left and flexor on the right Cerebellar: Normal finger-to-nose testing bilaterally. Carotid auscultation: Normal  Ct Head Wo Contrast  03/26/2013   CLINICAL DATA:  Right arm weakness  EXAM: CT HEAD WITHOUT CONTRAST  TECHNIQUE: Contiguous axial images were obtained from the base of the skull through the vertex without intravenous contrast.  COMPARISON:  Brain MRI 10/03/2010  FINDINGS: No skull fracture is noted. Paranasal sinuses and mastoid air cells are unremarkable. No intracranial hemorrhage, mass effect or midline shift. Ventricular size is stable from prior exam. No definite acute cortical infarction. The gray and white-matter differentiation is preserved. No mass lesion is noted on this unenhanced scan.  IMPRESSION: No acute intracranial abnormality. No definite acute cortical infarction.   Electronically Signed   By: Natasha Mead   On: 03/26/2013 17:22    Assessment: 57 y.o. male with multiple risk factors for stroke presenting with probable left subcortical MCA territory TIAs. Small vessel ischemic stroke cannot be ruled out, however.  Stroke Risk Factors - diabetes mellitus, family history, hyperlipidemia and hypertension  Plan: 1. HgbA1c, fasting lipid panel 2. MRI, MRA  of the brain without contrast 3. PT consult, OT consult, Speech consult 4. Echocardiogram 5. Carotid dopplers 6. Prophylactic therapy-aspirin 81 mg per  day and Plavix 75 mg per day 7. Risk factor modification 8. Telemetry monitoring   C.R. Roseanne Reno, MD [facility] [phone]  03/26/2013, 8:16 PM

## 2013-03-26 NOTE — H&P (Signed)
Family Medicine Teaching [facility] Admission History and Physical Service Pager: [phone]  Patient name: Shawn Hood Medical record number: [id number] Date of birth: July 14, 1955 Age: 57 y.o. Gender: male  Primary Care Provider: Carney Living, MD Consultants: Neurology Code Status: Full code  Chief Complaint: finger numbness  Assessment and Plan: Shawn Hood is a 57 y.o. male presenting with Likely TIA. PMH is significant for DMII, HLD, CAD s/p CABG, CVA (L sided deficits), HTN, former smoker.    # TIA: symptoms of numbness most concerning for recurrent stroke. W/o deficits on exam at admission. Resolution leads to Dx of TIA. Neuro onboard. Given h/o previous TIA and risk factors will proceed w/ full stroke workup. 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 CT negative for inctracranial process - Bedside swallow study (then hrt healthy diet).  - 2D Echo, Tele - Carotid dopplers - MRI/MRA w/,w/o contrast - PT/OT - Continue home ASA 325 and plavix - A1c,  - Lipids (not likely to change mgt as already on max Lipitor) - Allow permissive HTN. (start lisinopril tomorrow) - f/u Neuro Recs.  # CV: h/o CAD s/p CABG adn HTN. W/o CP. Allow HTN as above.  - Cont ASA/plavix - Start lisinopril in am - Tele - Echo as above.   # DM: Last A1c 7.5 in March. Would like to see pt closer to 7 if CBG will tolerate. On Lantus 30 and Novolog 10 TID and metformin.  - A1c as above - SSI - Hold Metformin  # HLD: on lipitor 80 at home - continue lipitor at max dose - lipid panel - CMET  FEN/GI: Swallow study then diet as above. No role for PPI at this time.  - IVF 1ml/hr. DC in am.  - Senna/miralax PRN  Prophylaxis: Hep [place] TID  Disposition: Likely DC after workup and so long as pt remains symptoms free.   History of Present Illness: Shawn Hood is a 57 y.o. male presenting with finger numbness on R hand. Started last night. Stopped after about 10-15 min. This  morning aroun 09:00 R arm became numb and heavy along w/ R face. Stopped after 15 min. Associated w/ HA. Denies any alleviating or aggrevating factors. Denies difficulty w/ speech, drooling, facial droop, difficulty ambulating, siyncope, n/v/d/c, fevers. Photophobia over past 2 days. Previous Stroke w/ residual L sided weakness ~ 7 yrs ago.   Review Of Systems: Per HPI with the following additions: non Otherwise 12 point review of systems was performed and was unremarkable.  Patient Active Problem List   Diagnosis Date Noted  . Diabetes mellitus type 2, uncontrolled, with complications   . Hyperlipidemia 02/27/2012  . CAD (coronary artery disease) of artery bypass graft 02/27/2012  . H/O: CVA (cerebrovascular accident) 02/27/2012  . Chest pain 10/03/2011  . HTN (hypertension) 10/03/2011   Past Medical History: Past Medical History  Diagnosis Date  . Diabetes mellitus type 2, uncontrolled, with complications DX: Age 57  . Hypertension   . Coronary artery disease     Hera. s/p 3-vessel CABG 2002. b NSTEMI 09/2010 with patent grafts, no obvious culprit, for med rx.  c. DES to SVG-OM, severe diffuse disease per [facility].  c.  DES to SVG to OM  9/13  . Hyperlipidemia   . 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  . Sinus bradycardia   . Myocardial infarction 2002  . Shortness of breath   . Stroke 2006 ?  left arm & leg   Past Surgical History: Past Surgical History  Procedure Laterality Date  . Coronary artery bypass graft  2002  . Shoulder surgery Left   . Cholecystectomy    . Coronary angioplasty with stent placement  2013   Social History: History  Substance Use Topics  . Smoking status: Former Smoker -- 1.50 packs/day for 10 years    Types: Cigarettes    Quit date: 06/26/1991  . Smokeless tobacco: Never Used     Comment: Quit 1993  . Alcohol Use: No   Additional social history: none Please also refer to relevant sections of EMR.  Family  History: Family History  Problem Relation Age of Onset  . Coronary artery disease Mother     Died at 97 of MI  . Coronary artery disease Brother     Died of CAD but had other medical problems  . Diabetes Mother   . Hypertension Mother   . Diabetes Sister   . Diabetes Brother   . Hypertension Brother    Allergies and Medications: No Known Allergies No current facility-administered medications on file prior to encounter.   Current Outpatient Prescriptions on File Prior to Encounter  Medication Sig Dispense Refill  . aspirin EC 325 MG tablet Take 325 mg by mouth daily.      . clopidogrel (PLAVIX) 75 MG tablet Take 75 mg by mouth daily.      . insulin aspart (NOVOLOG) 100 UNIT/ML injection Inject 10 Units into the skin 3 (three) times daily before meals.       . insulin glargine (LANTUS) 100 UNIT/ML injection Inject 30 Units into the skin at bedtime.       . metFORMIN (GLUCOPHAGE) 500 MG tablet Take 500 mg by mouth 2 (two) times daily with a meal.      . nitroGLYCERIN (NITROSTAT) 0.4 MG SL tablet Place 1 tablet (0.4 mg total) under the tongue every 5 (five) minutes as needed for chest pain.  30 tablet  0  . quinapril (ACCUPRIL) 20 MG tablet Take 20 mg by mouth daily.         Objective: BP 107/52  Pulse 53  Temp(Src) 98 F (36.7 C) (Oral)  Resp 18  Ht 5\' 11"  (1.803 m)  Wt 209 lb (94.802 kg)  BMI 29.16 kg/m2  SpO2 98% Exam: General: NAD, WNWD HEENT: MMM, EOMI Cardiovascular: RRR no m/r/g, no JVD Respiratory: CTAB, nml effort Abdomen: NABS, soft non-tender Extremities: 2+ pulses, trace edema, well perfused Skin: warm, intact,  Neuro: CN 2-12 intact, L grip strength 3/5 R 5/5; L arm flexion and thigh flexion 4/5, R 5/5; proprioception intact. Rhomberg neg. Gait w/ only slight L foot/leg lag.  Labs and Imaging: Results for orders placed during the hospital encounter of 03/26/13 (from the past 24 hour(s))  PROTIME-INR     Status: None   Collection Time    03/26/13  4:13 PM       Result Value Range   Prothrombin Time 13.4  11.6 - 15.2 seconds   INR 1.04  0.00 - 1.49  APTT     Status: None   Collection Time    03/26/13  4:13 PM      Result Value Range   aPTT 27  24 - 37 seconds  CBC     Status: Abnormal   Collection Time    03/26/13  4:13 PM      Result Value Range   WBC 3.7 (*) 4.0 - 10.5 K/uL  RBC 4.73  4.22 - 5.81 MIL/uL   Hemoglobin 13.1  13.0 - 17.0 g/dL   HCT 16.1 (*) 09.6 - 04.5 %   MCV 80.8  78.0 - 100.0 fL   MCH 27.7  26.0 - 34.0 pg   MCHC 34.3  30.0 - 36.0 g/dL   RDW 40.9  81.1 - 91.4 %   Platelets 200  150 - 400 K/uL  DIFFERENTIAL     Status: Abnormal   Collection Time    03/26/13  4:13 PM      Result Value Range   Neutrophils Relative % 30 (*) 43 - 77 %   Neutro Abs 1.1 (*) 1.7 - 7.7 K/uL   Lymphocytes Relative 60 (*) 12 - 46 %   Lymphs Abs 2.2  0.7 - 4.0 K/uL   Monocytes Relative 6  3 - 12 %   Monocytes Absolute 0.2  0.1 - 1.0 K/uL   Eosinophils Relative 3  0 - 5 %   Eosinophils Absolute 0.1  0.0 - 0.7 K/uL   Basophils Relative 1  0 - 1 %   Basophils Absolute 0.0  0.0 - 0.1 K/uL  COMPREHENSIVE METABOLIC PANEL     Status: Abnormal   Collection Time    03/26/13  4:13 PM      Result Value Range   Sodium 137  135 - 145 mEq/L   Potassium 3.3 (*) 3.5 - 5.1 mEq/L   Chloride 101  96 - 112 mEq/L   CO2 24  19 - 32 mEq/L   Glucose, Bld 89  70 - 99 mg/dL   BUN 16  6 - 23 mg/dL   Creatinine, Ser 7.82  0.50 - 1.35 mg/dL   Calcium 9.4  8.4 - 95.6 mg/dL   Total Protein 8.3  6.0 - 8.3 g/dL   Albumin 4.0  3.5 - 5.2 g/dL   AST 20  0 - 37 U/L   ALT 18  0 - 53 U/L   Alkaline Phosphatase 102  39 - 117 U/L   Total Bilirubin 0.6  0.3 - 1.2 mg/dL   GFR calc non Af Amer >90  >90 mL/min   GFR calc Af Amer >90  >90 mL/min  TROPONIN I     Status: None   Collection Time    03/26/13  4:13 PM      Result Value Range   Troponin I <0.30  <0.30 ng/mL  GLUCOSE, CAPILLARY     Status: None   Collection Time    03/26/13  4:27 PM      Result Value  Range   Glucose-Capillary 87  70 - 99 mg/dL  POCT I-STAT TROPONIN I     Status: None   Collection Time    03/26/13  4:34 PM      Result Value Range   Troponin i, poc 0.00  0.00 - 0.08 ng/mL   Comment 3           CBC     Status: Abnormal   Collection Time    03/26/13  8:58 PM      Result Value Range   WBC 4.6  4.0 - 10.5 K/uL   RBC 4.36  4.22 - 5.81 MIL/uL   Hemoglobin 12.5 (*) 13.0 - 17.0 g/dL   HCT 21.3 (*) 08.6 - 57.8 %   MCV 81.4  78.0 - 100.0 fL   MCH 28.7  26.0 - 34.0 pg   MCHC 35.2  30.0 - 36.0 g/dL   RDW  12.9  11.5 - 15.5 %   Platelets 189  150 - 400 K/uL  CREATININE, SERUM     Status: None   Collection Time    03/26/13  8:58 PM      Result Value Range   Creatinine, Ser 0.96  0.50 - 1.35 mg/dL   GFR calc non Af Amer >90  >90 mL/min   GFR calc Af Amer >90  >90 mL/min     Ct Head Wo Contrast  03/26/2013   CLINICAL DATA:  Right arm weakness  EXAM: CT HEAD WITHOUT CONTRAST  TECHNIQUE: Contiguous axial images were obtained from the base of the skull through the vertex without intravenous contrast.  COMPARISON:  Brain MRI 10/03/2010  FINDINGS: No skull fracture is noted. Paranasal sinuses and mastoid air cells are unremarkable. No intracranial hemorrhage, mass effect or midline shift. Ventricular size is stable from prior exam. No definite acute cortical infarction. The gray and white-matter differentiation is preserved. No mass lesion is noted on this unenhanced scan.  IMPRESSION: No acute intracranial abnormality. No definite acute cortical infarction.   Electronically Signed   By: Natasha Mead   On: 03/26/2013 17:22    Ozella Rocks, MD 03/26/2013, 6:26 PM PGY-3, [facility] Family Medicine FPTS Intern pager: [phone], text pages welcome

## 2013-03-26 NOTE — ED Notes (Signed)
Last night pt experienced numbness to 3 fingers of R hand lasting 15 minutes.  This am around 9 am pt felt numbness and heaviness to entire R arm to the point where he couldn't raise R arm. Pt went to pcp and was told to come here.  Right now pt states all symptoms are resolved.

## 2013-03-26 NOTE — ED Provider Notes (Signed)
CSN: [id number]     Arrival date & time 03/26/13  1601 History   First MD Initiated Contact with Patient 03/26/13 1627     Chief Complaint  Patient presents with  . Numbness   (Consider location/radiation/quality/duration/timing/severity/associated sxs/prior Treatment) HPI Comments: 57 year old male with a history of stroke and coronary artery disease presenting after a 15 minute episode of right hand numbness and weakness and right facial numbness.  He noticed it shortly after waking up. It resolved spontaneously.    Patient is a 57 y.o. male presenting with neurologic complaint.  Neurologic Problem This is a new problem. Episode onset: 8 hours ago. The problem occurs constantly. The problem has been resolved. Associated symptoms include headaches. Pertinent negatives include no chest pain, no abdominal pain and no shortness of breath. Nothing aggravates the symptoms. Nothing relieves the symptoms.    Past Medical History  Diagnosis Date  . Diabetes mellitus type 2, uncontrolled, with complications DX: Age 53  . Hypertension   . Coronary artery disease     Hera. s/p 3-vessel CABG 2002. b NSTEMI 09/2010 with patent grafts, no obvious culprit, for med rx.  c. DES to SVG-OM, severe diffuse disease per [facility].  c.  DES to SVG to OM  9/13  . Hyperlipidemia   . 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  . Sinus bradycardia   . Myocardial infarction 2002  . Shortness of breath   . Stroke 2006 ?    left arm & leg   Past Surgical History  Procedure Laterality Date  . Coronary artery bypass graft  2002  . Shoulder surgery Left   . Cholecystectomy    . Coronary angioplasty with stent placement  2013   Family History  Problem Relation Age of Onset  . Coronary artery disease Mother     Died at 59 of MI  . Coronary artery disease Brother     Died of CAD but had other medical problems  . Diabetes Mother   . Hypertension Mother   . Diabetes Sister   .  Diabetes Brother   . Hypertension Brother    History  Substance Use Topics  . Smoking status: Former Smoker -- 1.50 packs/day for 10 years    Types: Cigarettes    Quit date: 06/26/1991  . Smokeless tobacco: Never Used     Comment: Quit 1993  . Alcohol Use: No    Review of Systems  Constitutional: Negative for fever.  HENT: Negative for congestion.   Respiratory: Negative for cough and shortness of breath.   Cardiovascular: Negative for chest pain.  Gastrointestinal: Negative for nausea, vomiting, abdominal pain and diarrhea.  Neurological: Positive for headaches.  All other systems reviewed and are negative.    Allergies  Review of patient's allergies indicates no known allergies.  Home Medications   Current Outpatient Rx  Name  Route  Sig  Dispense  Refill  . aspirin EC 325 MG tablet   Oral   Take 325 mg by mouth daily.         . clopidogrel (PLAVIX) 75 MG tablet   Oral   Take 75 mg by mouth daily.         . insulin aspart (NOVOLOG) 100 UNIT/ML injection   Subcutaneous   Inject 10 Units into the skin 3 (three) times daily before meals.          . insulin glargine (LANTUS) 100 UNIT/ML injection   Subcutaneous  Inject 30 Units into the skin at bedtime.          . metFORMIN (GLUCOPHAGE) 500 MG tablet   Oral   Take 500 mg by mouth 2 (two) times daily with a meal.         . nitroGLYCERIN (NITROSTAT) 0.4 MG SL tablet   Sublingual   Place 1 tablet (0.4 mg total) under the tongue every 5 (five) minutes as needed for chest pain.   30 tablet   0   . quinapril (ACCUPRIL) 20 MG tablet   Oral   Take 20 mg by mouth at bedtime.          BP 111/61  Pulse 54  Temp(Src) 98 F (36.7 C) (Oral)  Resp 18  Ht 5\' 11"  (1.803 m)  Wt 209 lb (94.802 kg)  BMI 29.16 kg/m2  SpO2 99% Physical Exam  Nursing note and vitals reviewed. Constitutional: He is oriented to person, place, and time. He appears well-developed and well-nourished. No distress.  HENT:   Head: Normocephalic and atraumatic.  Mouth/Throat: Oropharynx is clear and moist.  Eyes: Conjunctivae are normal. Pupils are equal, round, and reactive to light. No scleral icterus.  Neck: Neck supple.  Cardiovascular: Normal rate, regular rhythm, normal heart sounds and intact distal pulses.   No murmur heard. Pulmonary/Chest: Effort normal and breath sounds normal. No stridor. No respiratory distress. He has no wheezes. He has no rales.  Abdominal: Soft. He exhibits no distension. There is no tenderness.  Musculoskeletal: Normal range of motion. He exhibits no edema.  Neurological: He is alert and oriented to person, place, and time. No cranial nerve deficit or sensory deficit. Coordination normal. GCS eye subscore is 4. GCS verbal subscore is 5. GCS motor subscore is 6.  4/5 strength in LUE and LLE.  5/5 strength in LUE and LLE.    Skin: Skin is warm and dry. No rash noted.  Psychiatric: He has a normal mood and affect. His behavior is normal.    ED Course  Procedures (including critical care time) Labs Review Labs Reviewed  CBC - Abnormal; Notable for the following:    WBC 3.7 (*)    HCT 38.2 (*)    All other components within normal limits  DIFFERENTIAL - Abnormal; Notable for the following:    Neutrophils Relative % 30 (*)    Neutro Abs 1.1 (*)    Lymphocytes Relative 60 (*)    All other components within normal limits  GLUCOSE, CAPILLARY  PROTIME-INR  APTT  COMPREHENSIVE METABOLIC PANEL  TROPONIN I  POCT I-STAT TROPONIN I   Imaging Review Ct Head Wo Contrast  03/26/2013   CLINICAL DATA:  Right arm weakness  EXAM: CT HEAD WITHOUT CONTRAST  TECHNIQUE: Contiguous axial images were obtained from the base of the skull through the vertex without intravenous contrast.  COMPARISON:  Brain MRI 10/03/2010  FINDINGS: No skull fracture is noted. Paranasal sinuses and mastoid air cells are unremarkable. No intracranial hemorrhage, mass effect or midline shift. Ventricular size is  stable from prior exam. No definite acute cortical infarction. The gray and white-matter differentiation is preserved. No mass lesion is noted on this unenhanced scan.  IMPRESSION: No acute intracranial abnormality. No definite acute cortical infarction.   Electronically Signed   By: Natasha Mead   On: 03/26/2013 17:22  All radiology studies independently viewed by me.     EKG - sinus rhythm, rate 57, normal axis, normal intervals, nonspecific ST/T changes similar to prior.  MDM   1. TIA (transient ischemic attack)    History concerning for TIA, especially given her risk profile. No current symptoms and his neurologic exam is at baseline (has some left sided weakness from a prior stroke).   6:05 PM CT head negative. He will be admitted to family practice for further workup. Also discussed case with Dr. Amada Jupiter (neurology).   Candyce Churn, MD 03/26/13 1806

## 2013-03-27 ENCOUNTER — Other Ambulatory Visit (HOSPITAL_COMMUNITY): Payer: Medicare Other

## 2013-03-27 ENCOUNTER — Observation Stay (HOSPITAL_COMMUNITY): Payer: Medicare Other

## 2013-03-27 DIAGNOSIS — Z8673 Personal history of transient ischemic attack (TIA), and cerebral infarction without residual deficits: Secondary | ICD-10-CM

## 2013-03-27 DIAGNOSIS — I2510 Atherosclerotic heart disease of native coronary artery without angina pectoris: Secondary | ICD-10-CM

## 2013-03-27 DIAGNOSIS — Z01810 Encounter for preprocedural cardiovascular examination: Secondary | ICD-10-CM

## 2013-03-27 DIAGNOSIS — I059 Rheumatic mitral valve disease, unspecified: Secondary | ICD-10-CM

## 2013-03-27 DIAGNOSIS — I6529 Occlusion and stenosis of unspecified carotid artery: Secondary | ICD-10-CM

## 2013-03-27 LAB — LIPID PANEL
Cholesterol: 137 mg/dL (ref 0–200)
HDL: 31 mg/dL — ABNORMAL LOW (ref >39)
Total CHOL/HDL Ratio: 4.4 RATIO
Triglycerides: 62 mg/dL (ref <150)

## 2013-03-27 LAB — GLUCOSE, CAPILLARY: Glucose-Capillary: 95 mg/dL (ref 70–99)

## 2013-03-27 LAB — HEMOGLOBIN A1C
Hgb A1c MFr Bld: 9.3 % — ABNORMAL HIGH (ref <5.7)
Mean Plasma Glucose: 220 mg/dL — ABNORMAL HIGH (ref <117)

## 2013-03-27 MED ORDER — IOHEXOL 350 MG/ML SOLN
50.0000 mL | Freq: Once | INTRAVENOUS | Status: AC | PRN
  Administered 2013-03-27: 50 mL via INTRAVENOUS

## 2013-03-27 MED ORDER — INFLUENZA VAC SPLIT QUAD 0.5 ML IM SUSP
0.5000 mL | INTRAMUSCULAR | Status: AC
  Administered 2013-03-28: 0.5 mL via INTRAMUSCULAR
  Filled 2013-03-27: qty 0.5

## 2013-03-27 NOTE — Evaluation (Signed)
Speech Language Pathology Evaluation Patient Details Name: Shawn Hood MRN: [id number] DOB: 09-Apr-1956 Today's Date: 03/27/2013 Time: 7829-5621 SLP Time Calculation (min): 24 min  Problem List:  Patient Active Problem List   Diagnosis Date Noted  . Diabetes mellitus type 2, uncontrolled, with complications   . Hyperlipidemia 02/27/2012  . CAD (coronary artery disease) of artery bypass graft 02/27/2012  . H/O: CVA (cerebrovascular accident) 02/27/2012  . Chest pain 10/03/2011  . HTN (hypertension) 10/03/2011   Past Medical History:  Past Medical History  Diagnosis Date  . Diabetes mellitus type 2, uncontrolled, with complications DX: Age 48  . Hypertension   . Coronary artery disease     [facility]era. s/p 3-vessel CABG 2002. b NSTEMI 09/2010 with patent grafts, no obvious culprit, for med rx.  c. DES to SVG-OM, severe diffuse disease per [facility].  c.  DES to SVG to OM  9/13  . Hyperlipidemia   . 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  . Sinus bradycardia   . Myocardial infarction 2002  . Shortness of breath   . Stroke 2006 ?    left arm & leg   Past Surgical History:  Past Surgical History  Procedure Laterality Date  . Coronary artery bypass graft  2002  . Shoulder surgery Left   . Cholecystectomy    . Coronary angioplasty with stent placement  2013   HPI:  Shawn Hood is an 57 y.o. male presenting with TIA with a past medical history of diabetes mellitus, hypertension, hyperlipidemia, previous stroke and coronary artery disease, presenting with history of transient numbness involving right upper extremity and right side of the face as well as weakness and coordination abnormality of right upper extremity.    Assessment / Plan / Recommendation Clinical Impression  SLE completed. Pt appears at baseline with all communication and cognitive function WNL. Reviewed stroke awareness and safe judgment for returning home with pt and family with use of  teach-back. Skilled ST services are not indicated at this time, SLP signing off.     SLP Assessment  Patient does not need any further Speech Lanaguage Pathology Services    Follow Up Recommendations  None            Pertinent Vitals/Pain n/a    SLP Evaluation Prior Functioning  Cognitive/Linguistic Baseline: Within functional limits Type of Home: House  Lives With: Spouse;Family Available Help at Discharge: Family;Available 24 hours/day Vocation: On disability   Cognition  Overall Cognitive Status: Within Functional Limits for tasks assessed Arousal/Alertness: Awake/alert Orientation Level: Oriented X4 Attention: Divided Divided Attention: Appears intact Memory: Appears intact Awareness: Appears intact Problem Solving: Appears intact Safety/Judgment: Appears intact    Comprehension  Auditory Comprehension Overall Auditory Comprehension: Appears within functional limits for tasks assessed Visual Recognition/Discrimination Discrimination: Within Function Limits Reading Comprehension Reading Status: Within funtional limits    Expression Expression Primary Mode of Expression: Verbal Verbal Expression Overall Verbal Expression: Appears within functional limits for tasks assessed Written Expression Written Expression: Within Functional Limits   Oral / Motor Oral Motor/Sensory Function Overall Oral Motor/Sensory Function: Appears within functional limits for tasks assessed Motor Speech Overall Motor Speech: Appears within functional limits for tasks assessed   GO     Chyrel Masson MA CCC-SLP 03/27/2013, 4:14 PM

## 2013-03-27 NOTE — Progress Notes (Signed)
Family Medicine Teaching Service Daily Progress Note Intern Pager: [phone]  Patient name: Dez Stauffer Medical record number: [id number] Date of birth: [birth year] Age: 57 y.o. Gender: male  Primary Care Provider: No primary provider on file. Consultants: Neurology, Cardiology, Vascular Surgery Code Status: Full  Pt Overview and Major Events to Date:  10/2: Admitted with TIA/CVA symptoms 10/3: Neuro work up including carotid doppler, echocardiogram  Assessment and Plan: Bryson Gavia is a 57 y.o. male presenting with Likely TIA. PMH is significant for DMII, HLD, CAD s/p CABG, CVA (L sided deficits), HTN, former smoker.   # TIA: probable left subcortical MCA territory TIA, possibly small vessel ischemia. W/o deficits on exam at admission.  - CT negative for inctracranial process  - MRI/MRA: possible L carotid siphon occlusion, no sign of stroke.  - 2D Echo today - Carotid dopplers today - PT/OT  - Continue home ASA 325mg  and plavix 75mg  - A1c pending - Lipids (on max lipitor) LDL 94 - Allow permissive HTN (BP too low for ACE as indicated by DM at this time) - Neuro recommendations appreciated  # CV: h/o CAD s/p CABG and HTN. W/o CP. Allow HTN as above.  - Cont ASA/plavix  - Tele  - Echo as above.   # DM: Last A1c 7.5 in March. Would like to see pt closer to 7 if CBG will tolerate. On Lantus 30 and Novolog 10 TID and metformin.  - A1c as above  - Mod. SSI, HS correction - Hold Metformin   # HLD: on lipitor 80 at home  - continue lipitor at max dose  - LDL 94, HDL 31, Trig 62  FEN/GI: Swallow study then diet as above. No role for PPI at this time.  - IVF 93ml/hr. DC in am.  - Senna/miralax PRN   Prophylaxis: Hep Bracken TID   Disposition: Pending further neuro work up   Subjective: Sitting up in bed without current complaints. No current HA/dizziness/vision changes/CP/SOB.   Objective: Temp:  [97.9 F (36.6 C)-98.6 F (37 C)] 98 F (36.7 C) (10/03 0602) Pulse Rate:   [44-59] 50 (10/03 0602) Resp:  [17-22] 18 (10/03 0602) BP: (107-135)/(52-74) 117/73 mmHg (10/03 0602) SpO2:  [97 %-100 %] 99 % (10/03 0602) Weight:  [209 lb (94.802 kg)] 209 lb (94.802 kg) (10/02 1622) Physical Exam: General: 57 yo man in NAD HEENT: MMM, EOMI, PERRL Cardiovascular: RRR no m/r/g, no JVD  Respiratory: CTAB, nonlabored  Abdomen: NABS, soft non-tender  Extremities: 2+ pulses, trace edema, well perfused  Skin: warm, intact,  Neuro: CN II-XII intact, Strength diminished mildly on R side. Rhomberg neg.  Laboratory:  Recent Labs Lab 03/26/13 1613 03/26/13 2058  WBC 3.7* 4.6  HGB 13.1 12.5*  HCT 38.2* 35.5*  PLT 200 189    Recent Labs Lab 03/26/13 1613 03/26/13 2058  NA 137  --   K 3.3*  --   CL 101  --   CO2 24  --   BUN 16  --   CREATININE 0.86 0.96  CALCIUM 9.4  --   PROT 8.3  --   BILITOT 0.6  --   ALKPHOS 102  --   ALT 18  --   AST 20  --   GLUCOSE 89  --    Imaging/Diagnostic Tests: MRI HEAD IMPRESSION:  Multiple punctate foci of acute ischemia within the left middle  cerebral artery territory (left frontoparietal lobes). No  hemorrhagic conversion.  MRA HEAD IMPRESSION:  Slow flow versus occlusion of the left  carotid siphon with inferred  retrograde filling of the left anterior circulation via a patent  anterior communicating artery. Normal flow related enhancement of  the left middle cerebral arteries and remaining anterior vessels.  Findings may be better characterized on CT angiogram the head, as  clinically indicated.  At least 50% narrowing at the origin of the basilar artery, with  luminal irregularity, suggesting intracranial atherosclerosis,  mildly advanced from prior examination.  CXR FINDINGS:  Prior CABG. Heart is normal size. Lungs are clear. No effusions. No  acute bony abnormality.  IMPRESSION:  No acute findings.   Hazeline Junker, MD 03/27/2013, 7:42 AM PGY-1, [facility] Family Medicine FPTS Intern pager: [phone],  text pages welcome

## 2013-03-27 NOTE — Evaluation (Signed)
Physical Therapy Evaluation Patient Details Name: Shawn Hood MRN: [id number] DOB: [birth year] Today's Date: 03/27/2013 Time: 4098-1191 PT Time Calculation (min): 12 min  PT Assessment / Plan / Recommendation History of Present Illness  Shawn Hood is an 57 y.o. male with a history of diabetes mellitus, hypertension, hyperlipidemia, previous stroke and coronary artery disease, presenting with history of transient numbness involving right upper extremity and right side of the face as well as weakness and coordination abnormality of right upper extremity  Clinical Impression  Patient is independent with mobility and gait.  Scored 24/24 on DGI balance assessment indicating no fall risk.  No acute PT needs identified - PT will sign off.    PT Assessment  Patent does not need any further PT services    Follow Up Recommendations  No PT follow up    Does the patient have the potential to tolerate intense rehabilitation      Barriers to Discharge        Equipment Recommendations  None recommended by PT    Recommendations for Other Services     Frequency      Precautions / Restrictions Precautions Precautions: None Restrictions Weight Bearing Restrictions: No   Pertinent Vitals/Pain       Mobility  Bed Mobility Bed Mobility: Supine to Sit;Sitting - Scoot to Edge of Bed;Sit to Supine Supine to Sit: 7: Independent Sitting - Scoot to Edge of Bed: 7: Independent Sit to Supine: 7: Independent Transfers Transfers: Sit to Stand;Stand to Sit Sit to Stand: 7: Independent;From bed Stand to Sit: 7: Independent;To bed Ambulation/Gait Ambulation/Gait Assistance: 7: Independent Ambulation Distance (Feet): 300 Feet Assistive device: None Ambulation/Gait Assistance Details: Patient with good gait pattern, speed, and balance. Gait Pattern: Within Functional Limits Gait velocity: WFL Stairs: Yes Stairs Assistance: 5: Supervision Stair Management Technique: No rails;Alternating  pattern;Forwards Number of Stairs: 3 Modified Rankin (Stroke Patients Only) Pre-Morbid Rankin Score: No significant disability (Lt weakness from prior CVA) Modified Rankin: No significant disability       PT Goals(Current goals can be found in the care plan section)  N/A  Visit Information  Last PT Received On: 03/27/13 Assistance Needed: +1 History of Present Illness: Shawn Hood is an 57 y.o. male with a history of diabetes mellitus, hypertension, hyperlipidemia, previous stroke and coronary artery disease, presenting with history of transient numbness involving right upper extremity and right side of the face as well as weakness and coordination abnormality of right upper extremity       Prior Functioning  Home Living Family/patient expects to be discharged to:: Private residence Living Arrangements: Spouse/significant other;Children Available Help at Discharge: Family;Available 24 hours/day Type of Home: House Home Access: Level entry Home Layout: Two level;Able to live on main level with bedroom/bathroom Home Equipment: None Prior Function Level of Independence: Independent Communication Communication: No difficulties    Cognition  Cognition Arousal/Alertness: Awake/alert Behavior During Therapy: WFL for tasks assessed/performed Overall Cognitive Status: Within Functional Limits for tasks assessed    Extremity/Trunk Assessment Upper Extremity Assessment Upper Extremity Assessment: LUE deficits/detail LUE Deficits / Details: Strength 4/5 from prior CVA Lower Extremity Assessment Lower Extremity Assessment: LLE deficits/detail LLE Deficits / Details: Strength 4/5 from prior CVA LLE Coordination: decreased gross motor Cervical / Trunk Assessment Cervical / Trunk Assessment: Normal   Balance Balance Balance Assessed: Yes Standardized Balance Assessment Standardized Balance Assessment: Dynamic Gait Index Dynamic Gait Index Level Surface: Normal Change in Gait  Speed: Normal Gait with Horizontal Head Turns: Normal Gait with Vertical Head Turns:  Normal Gait and Pivot Turn: Normal Step Over Obstacle: Normal Step Around Obstacles: Normal Steps: Normal Total Score: 24  End of Session PT - End of Session Activity Tolerance: Patient tolerated treatment well Patient left: in bed;with call bell/phone within reach;with family/visitor present Nurse Communication: Mobility status  GP Functional Assessment Tool Used: DGI balance assessment; clinical judgement Functional Limitation: Mobility: Walking and moving around Mobility: Walking and Moving Around Current Status (Z6109): 0 percent impaired, limited or restricted Mobility: Walking and Moving Around Goal Status (U0454): 0 percent impaired, limited or restricted Mobility: Walking and Moving Around Discharge Status [phone]): 0 percent impaired, limited or restricted   Vena Austria 03/27/2013, 3:13 PM  Durenda Hurt. Renaldo Fiddler, [facility] Acute Rehab Services Pager [phone]

## 2013-03-27 NOTE — Discharge Summary (Signed)
Family Medicine Teaching [facility] Discharge Summary  Patient name: Shawn Hood Medical record number: [id number] Date of birth: September 25, 1955 Age: 57 y.o. Gender: male Date of Admission: 03/26/2013  Date of Discharge: 03/31/2013 Admitting Physician: Carney Living, MD  Primary Care Provider: No primary provider on file. Consultants: Neurology, Cardiology, Vascular Surgery  Indication for Hospitalization: Transient ischemic attack  Discharge Diagnoses/Problem List:  Transient ischemic attack L internal carotid high grade stenosis  Type II diabetes mellitus CAD s/p 3-vessel CABG 2002 NSTEMI 09/2010 with patent grafts Sinus bradycardia HTN Hyperlipidemia H/o tobacco use  Disposition: To home  Discharge Condition: Stable  Discharge Exam:  Blood pressure 121/56, pulse 47, temperature 98.2 F (36.8 C), temperature source Oral, resp. rate 14, height 5\' 11"  (1.803 m), weight 215 lb 9.8 oz (97.8 kg), SpO2 96.00%. General: 57 yo man in NAD  Neck: Steri-strips in place over longitudinal surgical wound on L neck. No drainage or bleeding.  Cardiovascular: bradycardic no m/r/g Respiratory: CTAB, nonlabored  Abdomen: NABS, soft non-tender  Extremities: 2+ pulses, no edema, well perfused   Brief Hospital Course: Shawn Hood is a 57 y.o. male with a history of T2DM, CAD s/p CABG, HTN, hyperlipidemia, previous CVA with residual L-side weakness, presenting 03/26/2013 with 2 episodes of transient numbness involving right upper extremity and right side of the face. Symptoms had resolved in 15 minutes and NIHSS was 0 on arrival. CT scan of the head showed no acute intracranial abnormality. MRI showed multiple punctate foci of acute ischemia within the left middle cerebral artery territory (left frontoparietal lobes). Carotid artery dopplers revealed low grade stenosis in right ICA and 80-99% stenosis of left ICA. CTA further showed 99% occlusion of the left ICA and patient was scheduled for  carotid endarterectomy. On 03/30/2013 Dr. Arbie Cookey performed a carotid endarterectomy and the patient remained neurologically stable overnight in the stepdown unit. On 03/31/2013, he had tolerated breakfast, ambulated without symptoms, and was cleared for discharge by vascular surgery, neurology, and cardiology. Cardiology recommended antiplatelet therapy to include plavix 75mg  as well as aspirin 81mg  for secondary stroke prevention. Follow up appointments were made with his PCP and vascular surgery prior to discharge.   Issues for Follow Up:  Monitor for post-operative complications of L CEA. Risk reduction for future cardiovascular and cerebrovascular events.   Significant Procedures: Left carotid endarterectomy  Significant Labs and Imaging:   Recent Labs Lab 03/28/13 1615 03/30/13 0505 03/31/13 0431  WBC 5.4 5.5 9.7  HGB 13.8 12.8* 12.2*  HCT 39.4 36.2* 34.6*  PLT 230 226 235    Recent Labs Lab 03/26/13 1613 03/26/13 2058 03/29/13 1130 03/30/13 0505 03/31/13 0431  NA 137  --  138 140 139  K 3.3*  --  3.8 3.8 3.7  CL 101  --  101 103 104  CO2 24  --  29 27 24   GLUCOSE 89  --  193* 109* 139*  BUN 16  --  14 15 15   CREATININE 0.86 0.96 0.95 0.99 0.82  CALCIUM 9.4  --  9.6 9.0 8.8  ALKPHOS 102  --   --   --   --   AST 20  --   --   --   --   ALT 18  --   --   --   --   ALBUMIN 4.0  --   --   --   --    Hb A1c: 9.3  ECHOCARDIOGRAM - Left ventricle: The cavity size was normal. Wall thickness was  normal. Systolic function was vigorous. The estimated ejection fraction was in the range of 65% to 70%. Wall motion was normal; there were no regional wall motion abnormalities. - Mitral valve: Mild regurgitation. - Left atrium: The atrium was mildly dilated.  MRI HEAD  Findings: Multiple punctate foci of reduced diffusion within the  left frontal parietal lobes, as well as posterior limb of the left  internal capsule. Corresponding low ADC values consistent with  acute  ischemia. No definite associated FLAIR abnormality.  The ventricles and sulci are normal for patient's age. Remote  right pontine infarct as seen in 2012. No susceptibility artifact  to suggest hemorrhage. No abnormal intracranial intrinsic  shortening; punctate foci of low gradient signal seen in the left  thalamus, left parietal lobe most consistent with vascular flow  voids. No mass lesions, mass effect or midline shift.  No abnormal extra-axial fluid collections. Normal major  intracranial vascular flow voids seen at the skull base. No  definite extra-axial masses.  Mild paranasal sinus mucosal thickening without air fluid levels.  Mastoid air cells appear well aerated. Ocular globes and orbital  contents are not suspicious but not tailored for evaluation. No  suspicious calvarial bone marrow signal. Craniocervical junction  maintained.   IMPRESSION:  Multiple punctate foci of acute ischemia within the left middle  cerebral artery territory (left frontoparietal lobes). No  hemorrhagic conversion.  Remote right pontine infarct.   MRA HEAD  Findings: Anterior circulation: Normal flow related enhancement of the  included cervical internal carotid arteries, the origin of the  petrous segments. There is diminished flow related enhancement of  the distal left petrous internal carotid artery, with very  diminished left cavernous carotid artery flow related enhancement.  Normal flow related enhancement left carotid terminus, with normal  flow related enhancement the anterior and middle cerebral arteries  including main branches/segments. Patent appearing anterior  communicating artery.  Posterior circulation: Nearly codominant vertebral arteries. The  left vertebral artery courses to the right with widely patent  branch vessels. However, at least 50% narrowing of a 5 mm segment  the origin of the basilar artery, with mild irregularity of the  basilar artery diffusely and post stenotic  dilatation, mildly  advanced from prior examination. 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 No discrete left posterior communicating  artery is identified. Mildly diminished flow related enhancement of  the more distal right posterior cerebral artery segment.  No aneurysm.   IMPRESSION:  Slow flow versus occlusion of the left carotid siphon with inferred  retrograde filling of the left anterior circulation via a patent  anterior communicating artery. Normal flow related enhancement of  the left middle cerebral arteries and remaining anterior vessels.  Findings may be better characterized on CT angiogram the head, as  clinically indicated.  At least 50% narrowing at the origin of the basilar artery, with  luminal irregularity, suggesting intracranial atherosclerosis,  mildly advanced from prior examination.  CT HEAD FINDINGS:  No skull fracture is noted. Paranasal sinuses and mastoid air cells  are unremarkable. No intracranial hemorrhage, mass effect or midline  shift. Ventricular size is stable from prior exam. No definite acute  cortical infarction. The gray and white-matter differentiation is  preserved. No mass lesion is noted on this unenhanced scan.  IMPRESSION:  No acute intracranial abnormality. No definite acute cortical  infarction.  CXR FINDINGS:  Prior CABG. Heart is normal size. Lungs are clear. No effusions. No  acute  bony abnormality.  IMPRESSION:  No acute findings.   Results/Tests Pending at Time of Discharge: None  Discharge Medications:    Medication List    ASK your doctor about these medications       aspirin EC 325 MG tablet  Take 325 mg by mouth daily.     atorvastatin 80 MG tablet  Commonly known as:  LIPITOR  Take 80 mg by mouth at bedtime.     clopidogrel 75 MG tablet  Commonly known as:  PLAVIX  Take 75 mg by mouth daily.     insulin aspart 100 UNIT/ML injection   Commonly known as:  novoLOG  Inject 10 Units into the skin 3 (three) times daily before meals.     insulin glargine 100 UNIT/ML injection  Commonly known as:  LANTUS  Inject 30 Units into the skin at bedtime.     metFORMIN 500 MG tablet  Commonly known as:  GLUCOPHAGE  Take 500 mg by mouth 2 (two) times daily with a meal.     nitroGLYCERIN 0.4 MG SL tablet  Commonly known as:  NITROSTAT  Place 1 tablet (0.4 mg total) under the tongue every 5 (five) minutes as needed for chest pain.     quinapril 20 MG tablet  Commonly known as:  ACCUPRIL  Take 20 mg by mouth daily.        Discharge Instructions: Please refer to Patient Instructions section of EMR for full details.  Patient was counseled important signs and symptoms that should prompt return to medical care, changes in medications, dietary instructions, activity restrictions, and follow up appointments.   Follow-Up Appointments: Follow-up Information   Follow up with EARLY, TODD, MD In 2 weeks. (Office will call you to arrange your appt (sent))    Specialty:  Vascular Surgery   Contact information:   [address] [place] [address] [phone]       To follow up with Neurology Dr. Pearlean Brownie 2 months    Hazeline Junker, MD 03/31/2013, 12:09 PM PGY-1, [facility] Family [facility]

## 2013-03-27 NOTE — Progress Notes (Signed)
VASCULAR LAB PRELIMINARY  PRELIMINARY  PRELIMINARY  PRELIMINARY  Carotid duplex completed.    Preliminary report:  Right - 1% to 39% ICA stenosis. Vertebral artery flow is antegrade with elevated velocities suggestive of a more distal obstruction or stenosis.                                 Left -  80% to 99% ICA stenosis at the distal bulb /proximal ICA. Vertebral artery flow is antegrade.  Syanne Looney, RVS 03/27/2013, 9:34 AM

## 2013-03-27 NOTE — Progress Notes (Signed)
  Echocardiogram 2D Echocardiogram has been performed.  Christyl Osentoski FRANCES 03/27/2013, 9:49 AM

## 2013-03-27 NOTE — Progress Notes (Signed)
Inpatient Diabetes Program Recommendations  AACE/ADA: New Consensus Statement on Inpatient Glycemic Control (2013)  Target Ranges:  Prepandial:   less than 140 mg/dL      Peak postprandial:   less than 180 mg/dL (1-2 hours)      Critically ill patients:  140 - 180 mg/dL   Reason for Visit: Results for Shawn Hood, Shawn Hood (MRN [id number]) as of 03/27/2013 16:04  Ref. Range 09/05/2012 11:33 03/26/2013 16:27 03/26/2013 22:28 03/27/2013 06:46 03/27/2013 11:46  Glucose-Capillary Latest Range: 70-99 mg/dL 782 (H) 87 956 (H) 95 213 (H)  Results for Shawn Hood, Shawn Hood (MRN [id number]) as of 03/27/2013 16:04  Ref. Range 03/27/2013 06:07  Hemoglobin A1C Latest Range: <5.7 % 9.3 (H)   Spoke to patient regarding home insulin regimen.  He takes Lantus 30 units daily and Novolog 10 units tid with meals. He states that his last dose of Lantus was 03/26/13 .  CBG's okay today.  Consider restarting Lantus 15 units daily and Novolog 5 units tid with meals (approximately 1/2 of home regimen) while in hospital.  Discussed elevated A1C with patient.  He states that he used to exercise much more but has stopped walking as much as he was.  Discussed the effects of exercise on glucose levels.  Encouraged follow-up with PCP.     Beryl Meager, RN, BC-ADM Inpatient Diabetes Coordinator Pager [phone]

## 2013-03-27 NOTE — Progress Notes (Signed)
Stroke Team Progress Note  HISTORY Shawn Hood is an 57 y.o. male with a history of diabetes mellitus, hypertension, hyperlipidemia, previous stroke and coronary artery disease, presenting with history of transient numbness involving right upper extremity and right side of the face as well as weakness and coordination abnormality of right upper extremity. Initial symptoms occurred last night at about 9 PM and lasted about 15 minutes involving numbness of his right hand. It recurrent numbness at 9 AM today involving right upper extremity and right side of his face. He also noticed difficulty lifting his right arm as well as coordination difficulty of his right upper extremity. Symptoms again lasted about 15 minutes then resolved completely. CT scan of his head showed no acute intracranial abnormality. Patient has been taking aspirin and Plavix daily. NIH stroke score at this point is 0. He was last known well at 9 AM today.   LSN: 9 AM on 03/26/2013  tPA Given: No: Deficits resolved  MRankin: 0  Patient was not a TPA candidate secondary to resolution of deficits. He was admitted for further evaluation and treatment.  SUBJECTIVE He is sitting in room Overall he feels his condition is completely resolved. He is wanting to go home.  OBJECTIVE Most recent Vital Signs: Filed Vitals:   03/27/13 0602 03/27/13 0800 03/27/13 1012 03/27/13 1347  BP: 117/73 127/68 135/70 142/61  Pulse: 50 50 50 50  Temp: 98 F (36.7 C) 97.5 F (36.4 C) 97.8 F (36.6 C) 98.1 F (36.7 C)  TempSrc: Oral Oral Oral Oral  Resp: 18 18 18 17   Height:      Weight:      SpO2: 99% 99% 100% 100%   CBG (last 3)   Recent Labs  03/26/13 2228 03/27/13 0646 03/27/13 1146  GLUCAP 267* 95 154*    IV Fluid Intake:   . sodium chloride 1,000 mL (03/26/13 2029)    MEDICATIONS  . aspirin EC  325 mg Oral Daily  . atorvastatin  80 mg Oral QHS  . heparin  5,000 Units Subcutaneous Q8H  . [START ON 03/28/2013] influenza vac  split quadrivalent PF  0.5 mL Intramuscular Tomorrow-1000  . insulin aspart  0-15 Units Subcutaneous TID WC  . insulin aspart  0-5 Units Subcutaneous QHS  . lisinopril  20 mg Oral Daily   PRN:  senna-docusate  Diet:  Cardiac thin liquids Activity:  OOB with assist DVT Prophylaxis:  Heparin SQ  CLINICALLY SIGNIFICANT STUDIES Basic Metabolic Panel:   Recent Labs Lab 03/26/13 1613 03/26/13 2058  NA 137  --   K 3.3*  --   CL 101  --   CO2 24  --   GLUCOSE 89  --   BUN 16  --   CREATININE 0.86 0.96  CALCIUM 9.4  --    Liver Function Tests:   Recent Labs Lab 03/26/13 1613  AST 20  ALT 18  ALKPHOS 102  BILITOT 0.6  PROT 8.3  ALBUMIN 4.0   CBC:   Recent Labs Lab 03/26/13 1613 03/26/13 2058  WBC 3.7* 4.6  NEUTROABS 1.1*  --   HGB 13.1 12.5*  HCT 38.2* 35.5*  MCV 80.8 81.4  PLT 200 189   Coagulation:   Recent Labs Lab 03/26/13 1613  LABPROT 13.4  INR 1.04   Cardiac Enzymes:   Recent Labs Lab 03/26/13 1613  TROPONINI <0.30   Urinalysis: No results found for this basename: COLORURINE, APPERANCEUR, LABSPEC, PHURINE, GLUCOSEU, HGBUR, BILIRUBINUR, KETONESUR, PROTEINUR, UROBILINOGEN, NITRITE, LEUKOCYTESUR,  in  the last 168 hours Lipid Panel    Component Value Date/Time   CHOL 137 03/27/2013 0607   TRIG 62 03/27/2013 0607   HDL 31* 03/27/2013 0607   CHOLHDL 4.4 03/27/2013 0607   VLDL 12 03/27/2013 0607   LDLCALC 94 03/27/2013 0607   HgbA1C  Lab Results  Component Value Date   HGBA1C 9.3* 03/27/2013    Urine Drug Screen:   No results found for this basename: labopia,  cocainscrnur,  labbenz,  amphetmu,  thcu,  labbarb    Alcohol Level: No results found for this basename: ETH,  in the last 168 hours  Ct Angio Head W/cm &/or Wo Cm 03/27/2013    1. Bilateral ICA siphon atherosclerotic plaque. Stenosis at the left ICA anterior genu measures less than 50% with respect to the supra clinoid ICA, but might be numerically underestimated. Right ICA siphon without  significant stenosis, and both carotid termini are patent.  2. Mild to moderate irregularity of the left MCA M1 segment, but patent bifurcation and no left MCA branch occlusion identified.  3. Advanced posterior circulation atherosclerosis. Mild to moderate distal right vertebral artery stenosis. 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  4. Stable and negative CT appearance of the brain ; small diffusion positive lesions in the left hemisphere seen yesterday are not yet evident.   Dg Chest 2 View 03/26/2013 No acute findings.    Ct Head Wo Contrast 03/26/2013   No acute intracranial abnormality. No definite acute cortical infarction.    Ct Angio Neck W/cm &/or Wo/cm 03/27/2013   1. Very high-grade stenosis of the left ICA distal bulb, radiographic string-sign, related to bulky and irregular soft and calcified plaque. The cervical left ICA remains patent.  2. 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  3. Diminutive vertebral arteries with bilateral proximal vertebral stenosis (moderate).  4. No significant right cervical carotid stenosis.    Mr Brain Wo Contrast 03/26/2013    Multiple punctate foci of acute ischemia within the left middle cerebral artery territory (left frontoparietal lobes).  No hemorrhagic conversion.  Remote right pontine infarct.   Mr [name]/brain Wo Cm 03/26/2013   Slow flow versus occlusion of the left carotid siphon with inferred retrograde filling of the left anterior circulation via a patent anterior communicating artery.  Normal flow related enhancement of the left middle cerebral arteries and remaining anterior vessels. Findings may be better characterized on CT angiogram the head, as clinically indicated.  At least 50% narrowing at the origin of the basilar artery, with luminal irregularity, suggesting intracranial atherosclerosis, mildly advanced from prior examination.   2D Echocardiogram   EF 65$, no wall motion abnormalities, LA mildly dilated.  Carotid Doppler  See angio Right - 1% to 39% ICA stenosis. Vertebral artery flow is antegrade with elevated velocities suggestive of a more distal obstruction or stenosis.  Left - 80% to 99% ICA stenosis at the distal bulb /proximal ICA. Vertebral artery flow is antegrade.  CXR    EKG  normal sinus rhythm.   Therapy Recommendations   Physical Exam   Neurologic Examination:  Mental Status:  Alert, oriented, thought content appropriate. Speech fluent without evidence of aphasia. Able to follow commands without difficulty.  Cranial Nerves:  II-Visual fields were normal.  III/IV/VI-Pupils were equal and reacted. Extraocular movements were full and conjugate.  V/VII-no facial numbness and no facial weakness.  VIII-normal.  X-normal speech and  symmetrical palatal movement.  Motor: Slightly reduced hand grip on the left as well as mild left hip flexor weakness (residual from previous stroke); motor exam otherwise unremarkable  Sensory: Reduced perception of touch as well as vibration below the knees bilaterally.  Deep Tendon Reflexes: Asymmetric with slightly greater responses elicited from left extremities compared to right extremities; right ankle jerk was absent.  Plantars: Extensor on the left and flexor on the right  Cerebellar: Normal finger-to-nose testing bilaterally.  Carotid auscultation: Normal   ASSESSMENT Mr. Shawn Hood is a 57 y.o. male presenting with transient sensory loss and hemiparesis of right upper extremity and face for 15 minutes on several occasions, symptoms resolved. Imaging confirms a multiple punctate foci of acute ischemia within the left middle cerebral artery territory, frontoparietal lobes. Infarct felt to be thrombotic secondary to severe left carotid artery stenosis.  On aspirin 325 mg orally every day and clopidogrel 75 mg orally every day prior to admission. Now on aspirin 325 mg orally every day  for secondary stroke prevention. Patient with resultant resolved symptoms. Work up underway.   Left carotid stenosis, symptomatic  Diabetes mellitus, type 2, uncontrolled, HGB A1C  9.3  Dyslipidemia, LDL 94, goal < 70, on statin  Hypertension  Coronary artery disease   Stroke   Hospital day # 1  TREATMENT/PLAN  Continue aspirin 325 mg orally every day for secondary stroke prevention.  VVS consulted  Cardiology called for surgical clearance.  CT Angio results called to me. I have contacted primary team to discuss results. This team is contacting Dr. Darrick Penna, vascular surgeon, to decide next plan of care.  Gwendolyn Lima. Manson Passey, [facility], MBA, MHA [facility] Pager: [phone] 03/27/2013 3:07 PM  I have personally obtained a history, examined the patient, evaluated imaging results, and formulated the assessment and plan of care. I agree with the above. Delia Heady, MD

## 2013-03-27 NOTE — Consult Note (Signed)
[facility] NOTE   MRN : [id number]  Reason for Consult: left ICA stenosis  Referring Physician: Carney Living, MD   History of Present Illness: 57 y.o. Male with Transient right sided face numbness and weakness of the right upper extremity.  He reports no amaurosis fugax, no aphasia.  These symptoms started last night then resolved and reoccurred this am at 9:00.   The symptoms lasted 15 min. And then resolved again.  Past medical history includes DM, hypertension CAD,MI and CVA 09/2010 with left leg weakness.  He currently takes Asprin 325 mg, Lipitor 80 mg, Plavix 75 mg, insulin, metformin, and Accupril 20 mg.  A carotid duplex was done and shows Left ICA 80-99% stenosis.  Right ICA is < 40%.       Current Facility-Administered Medications  Medication Dose Route Frequency Provider Last Rate Last Dose  . 0.9 %  sodium chloride infusion   Intravenous Continuous Ozella Rocks, MD 75 mL/hr at 03/26/13 2029 1,000 mL at 03/26/13 2029  . aspirin EC tablet 325 mg  325 mg Oral Daily Ozella Rocks, MD   325 mg at 03/27/13 4098  . atorvastatin (LIPITOR) tablet 80 mg  80 mg Oral QHS Ozella Rocks, MD   80 mg at 03/26/13 2226  . clopidogrel (PLAVIX) tablet 75 mg  75 mg Oral Q breakfast Ozella Rocks, MD   75 mg at 03/27/13 0742  . heparin injection 5,000 Units  5,000 Units Subcutaneous Q8H Ozella Rocks, MD   5,000 Units at 03/27/13 [phone]  . [START ON 03/28/2013] influenza vac split quadrivalent PF (FLUARIX) injection 0.5 mL  0.5 mL Intramuscular Tomorrow-1000 Carney Living, MD      . insulin aspart (novoLOG) injection 0-15 Units  0-15 Units Subcutaneous TID WC Ozella Rocks, MD      . insulin aspart (novoLOG) injection 0-5 Units  0-5 Units Subcutaneous QHS Ozella Rocks, MD   3 Units at 03/26/13 2231  . lisinopril (PRINIVIL,ZESTRIL) tablet 20 mg  20 mg Oral Daily Ozella Rocks, MD   20 mg at 03/27/13 0959  . senna-docusate (Senokot-S)  tablet 1 tablet  1 tablet Oral QHS PRN Ozella Rocks, MD        Pt meds include: Statin :Yes Betablocker: No ASA: Yes Other anticoagulants/antiplatelets: Plavix  Past Medical History  Diagnosis Date  . Diabetes mellitus type 2, uncontrolled, with complications DX: Age 89  . Hypertension   . Coronary artery disease     [facility]era. s/p 3-vessel CABG 2002. b NSTEMI 09/2010 with patent grafts, no obvious culprit, for med rx.  c. DES to SVG-OM, severe diffuse disease per [facility].  c.  DES to SVG to OM  9/13  . Hyperlipidemia   . 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  . Sinus bradycardia   . Myocardial infarction 2002  . Shortness of breath   . Stroke 2006 ?    left arm & leg    Past Surgical History  Procedure Laterality Date  . Coronary artery bypass graft  2002  . Shoulder surgery Left   . Cholecystectomy    . Coronary angioplasty with stent placement  2013    Social History History  Substance Use Topics  . Smoking status: Former Smoker -- 1.50 packs/day for 10 years    Types: Cigarettes    Quit date: 06/26/1991  . Smokeless tobacco: Never Used  Comment: Quit 1993  . Alcohol Use: No    Family History Family History  Problem Relation Age of Onset  . Coronary artery disease Mother     Died at 72 of MI  . Coronary artery disease Brother     Died of CAD but had other medical problems  . Diabetes Mother   . Hypertension Mother   . Diabetes Sister   . Diabetes Brother   . Hypertension Brother     No Known Allergies   REVIEW OF SYSTEMS  General: [ ]  Weight loss, [ ]  Fever, [ ]  chills Neurologic: [ ]  Dizziness, [ ]  Blackouts, [ ]  Seizure [ ]  Stroke, [ x] "Mini stroke", [ ]  Slurred speech, [ ]  Temporary blindness; [ ]  weakness in arms or legs, [ ]  Hoarseness [ ]  Dysphagia Cardiac: [ ]  Chest pain/pressure, [ ]  Shortness of breath at rest [ ]  Shortness of breath with exertion, [ ]  Atrial fibrillation or irregular heartbeat  Vascular:  [ ]  Pain in legs with walking, [ ]  Pain in legs at rest, [ ]  Pain in legs at night,  [ ]  Non-healing ulcer, [ ]  Blood clot in vein/DVT,   Pulmonary: [ ]  Home oxygen, [ ]  Productive cough, [ ]  Coughing up blood, [ ]  Asthma,  [ ]  Wheezing [ ]  COPD Musculoskeletal:  [ ]  Arthritis, [ ]  Low back pain, [ ]  Joint pain Hematologic: [ ]  Easy Bruising, [ ]  Anemia; [ ]  Hepatitis Gastrointestinal: [ ]  Blood in stool, [ ]  Gastroesophageal Reflux/heartburn, Urinary: [ ]  chronic Kidney disease, [ ]  on HD - [ ]  MWF or [ ]  TTHS, [ ]  Burning with urination, [ ]  Difficulty urinating Skin: [ ]  Rashes, [ ]  Wounds Psychological: [ ]  Anxiety, [ ]  Depression  Physical Examination Filed Vitals:   03/27/13 0424 03/27/13 0602 03/27/13 0800 03/27/13 1012  BP: 110/67 117/73 127/68 135/70  Pulse: 53 50 50 50  Temp: 97.9 F (36.6 C) 98 F (36.7 C) 97.5 F (36.4 C) 97.8 F (36.6 C)  TempSrc: Oral Oral Oral Oral  Resp: 18 18 18 18   Height:      Weight:      SpO2: 97% 99% 99% 100%   Body mass index is 29.16 kg/(m^2).  General:  WDWN in NAD HENT: WNL Eyes: Pupils equal Pulmonary: normal non-labored breathing , without Rales, rhonchi,  wheezing Cardiac: RRR, without  Murmurs, rubs or gallops; No carotid bruits Abdomen: soft, NT, no masses Skin: no rashes, ulcers noted;  no Gangrene , no cellulitis; no open wounds;   Vascular Exam/Pulses:Palpable pulses radial, brachial,femoral,DP/PT bilaterally.  Negative carotid bruits.   Musculoskeletal: no muscle wasting or atrophy; no edema Strength 5/5 and symmetrical of all four extremities.  No facial droop all cranial nerves are intact.  Neurologic: A&O X 3; Appropriate Affect ;  SENSATION: normal; Speech is fluent/normal   Significant Diagnostic Studies: CBC Lab Results  Component Value Date   WBC 4.6 03/26/2013   HGB 12.5* 03/26/2013   HCT 35.5* 03/26/2013   MCV 81.4 03/26/2013   PLT 189 03/26/2013    BMET    Component Value Date/Time   NA 137  03/26/2013 1613   K 3.3* 03/26/2013 1613   CL 101 03/26/2013 1613   CO2 24 03/26/2013 1613   GLUCOSE 89 03/26/2013 1613   BUN 16 03/26/2013 1613   CREATININE 0.96 03/26/2013 2058   CALCIUM 9.4 03/26/2013 1613   GFRNONAA >90 03/26/2013 2058   GFRAA >90 03/26/2013 2058  Estimated Creatinine Clearance: 102.2 ml/min (by C-G formula based on Cr of 0.96).  COAG Lab Results  Component Value Date   INR 1.04 03/26/2013   INR 0.92 09/04/2012   INR 0.93 02/27/2012   Ct Head Wo Contrast IMPRESSION: No acute intracranial abnormality. No definite acute cortical infarction.     Non-Invasive Vascular Imaging:   A carotid duplex was done and shows Left ICA 80-99% stenosis.  Right ICA is < 40%.       ASSESSMENT/PLAN:  Left carotid stenosis ICA >80% TIA with resolved symptoms Dr. Darrick Penna will review the studies and he will likely need a left carotid endarterectomy.   [facility] 03/27/2013 11:20 AM  History and exam details as above.  Right face and arm TIA.  High grade left ICA stenosis on duplex.  CTA results pending.  If no significant intracranial occlusion as suggested by MRI then would proceed with Left CEA on Monday.  I will discuss with patient tomorrow morning after final CT results.  Will stop Plavix for now continue aspirin.  Fabienne Bruns, MD [facility] [place] Office: [phone] Pager: [phone]

## 2013-03-27 NOTE — Progress Notes (Signed)
OT Discharge NOTE  Patient Details Name: Zerrick Hanssen MRN: [id number] DOB: 06-03-1956   Cancelled Treatment:    Reason Eval/Treat Not Completed: OT screened, no needs identified, will sign off. Ot spoke directly with patient and reports no deficits. Pt feels UE deficits are baseline and no further needs.  Harolyn Rutherford Pager: [phone]  03/27/2013, 4:12 PM

## 2013-03-27 NOTE — Progress Notes (Signed)
UR completed; B Jaeleah Smyser RN,BSN,MHA 

## 2013-03-27 NOTE — H&P (Signed)
Family Medicine Teaching Service Attending Note  I interviewed and examined patient Shawn Hood and reviewed their tests and x-rays.  I discussed with Dr. Konrad Dolores and reviewed their note for today.  I agree with their assessment and plan.     Additionally  Feels back to normal No need for rehab or evaluation since at baseline Main task is risk factor reduction - check carotids control lipids blood pressure and antiplatelet therapy

## 2013-03-27 NOTE — Progress Notes (Signed)
FMTS Attending Admission Note: Shawn Lippman,MD I  have seen and examined this patient, reviewed their chart. I have discussed this patient with the resident. I agree with the resident's findings, assessment and care plan.  

## 2013-03-27 NOTE — Consult Note (Signed)
Patient ID: Kaymen Adrian MRN: [id number], DOB/AGE: December 20, 1955   Admit date: 03/26/2013 Date of Consult: @TODAY @  Primary Physician: No primary provider on file. Primary Cardiologist: Hochrein   Problem List: Past Medical History  Diagnosis Date  . Diabetes mellitus type 2, uncontrolled, with complications DX: Age 57  . Hypertension   . Coronary artery disease     [facility]. s/p 3-vessel CABG 2002. b NSTEMI 09/2010 with patent grafts, no obvious culprit, for med rx.  c. DES to SVG-OM, severe diffuse disease per [facility].  c.  DES to SVG to OM  9/13  . Hyperlipidemia   . 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  . Sinus bradycardia   . Myocardial infarction 2002  . Shortness of breath   . Stroke 2006 ?    left arm & leg    Past Surgical History  Procedure Laterality Date  . Coronary artery bypass graft  2002  . Shoulder surgery Left   . Cholecystectomy    . Coronary angioplasty with stent placement  2013     Allergies: No Known Allergies  HPI: Patient is a 57 yo who we are asked to see re preop cardiac risk The patient has a history of CAD (s/p CABG in 2002; NSTEMI 2012 with no culprit found .  Last cath in Sept 2013: LAD 100% mid; LCx 100% prox; RCA diffuse dz.  AM 100%  LIMA to LAD patent  LAD  LAD with diffuse disease; SVG to OM 99% prox; SVG to AM 60% mid; .  Underwent DES to SVG to OM.   With diffuse disease recomm continued plavix and ASA   Admitted in March 2014 with CP/L arm pain.   R/O for MI   B locker added to regimen  Seen in clinic after  Doing OK    Patient admitted on 10/2 with numbness R face and weakness RUE  Work up shows 80 to 99% ICA on L       Talking to patient he denies CP  He has been feeling pretty good  No SOB  Walking.     Inpatient Medications:  . aspirin EC  325 mg Oral Daily  . atorvastatin  80 mg Oral QHS  . clopidogrel  75 mg Oral Q breakfast  . heparin  5,000 Units Subcutaneous Q8H  . [START ON 03/28/2013] influenza  vac split quadrivalent PF  0.5 mL Intramuscular Tomorrow-1000  . insulin aspart  0-15 Units Subcutaneous TID WC  . insulin aspart  0-5 Units Subcutaneous QHS  . lisinopril  20 mg Oral Daily    Family History  Problem Relation Age of Onset  . Coronary artery disease Mother     Died at 7 of MI  . Coronary artery disease Brother     Died of CAD but had other medical problems  . Diabetes Mother   . Hypertension Mother   . Diabetes Sister   . Diabetes Brother   . Hypertension Brother      History   Social History  . Marital Status: Married    Spouse Name: N/A    Number of Children: 4  . Years of Education: 12th grade   Occupational History  . Disabled     since 2005, for his heart disease. Previously worked in [employer].   Social History Main Topics  . Smoking status: Former Smoker -- 1.50 packs/day for 10 years    Types: Cigarettes  Quit date: 06/26/1991  . Smokeless tobacco: Never Used     Comment: Quit 1993  . Alcohol Use: No  . Drug Use: No  . Sexual Activity: Yes   Other Topics Concern  . 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       Review of Systems: All other systems reviewed and are otherwise negative except as noted above.  Physical Exam: Filed Vitals:   03/27/13 1012  BP: 135/70  Pulse: 50  Temp: 97.8 F (36.6 C)  Resp: 18   No intake or output data in the 24 hours ending 03/27/13 1109  General: Well developed, well nourished, in no acute distress. Head: Normocephalic, atraumatic, sclera non-icteric Neck: Negative for carotid bruits. JVP not elevated. Lungs: Clear bilaterally to auscultation without wheezes, rales, or rhonchi. Breathing is unlabored. Heart: RRR with S1 S2. No murmurs, rubs, or gallops appreciated. Abdomen: Soft, non-tender, non-distended with normoactive bowel sounds. No hepatomegaly. No rebound/guarding. No obvious abdominal masses. Msk:  Strength and tone  appears normal for age. Extremities: No clubbing, cyanosis or edema.  Distal pedal pulses are 2+ and equal bilaterally. Neuro: Alert and oriented X 3. Moves all extremities spontaneously. Psych:  Responds to questions appropriately with a normal affect.  Labs: Results for orders placed during the hospital encounter of 03/26/13 (from the past 24 hour(s))  PROTIME-INR     Status: None   Collection Time    03/26/13  4:13 PM      Result Value Range   Prothrombin Time 13.4  11.6 - 15.2 seconds   INR 1.04  0.00 - 1.49  APTT     Status: None   Collection Time    03/26/13  4:13 PM      Result Value Range   aPTT 27  24 - 37 seconds  CBC     Status: Abnormal   Collection Time    03/26/13  4:13 PM      Result Value Range   WBC 3.7 (*) 4.0 - 10.5 K/uL   RBC 4.73  4.22 - 5.81 MIL/uL   Hemoglobin 13.1  13.0 - 17.0 g/dL   HCT 16.1 (*) 09.6 - 04.5 %   MCV 80.8  78.0 - 100.0 fL   MCH 27.7  26.0 - 34.0 pg   MCHC 34.3  30.0 - 36.0 g/dL   RDW 40.9  81.1 - 91.4 %   Platelets 200  150 - 400 K/uL  DIFFERENTIAL     Status: Abnormal   Collection Time    03/26/13  4:13 PM      Result Value Range   Neutrophils Relative % 30 (*) 43 - 77 %   Neutro Abs 1.1 (*) 1.7 - 7.7 K/uL   Lymphocytes Relative 60 (*) 12 - 46 %   Lymphs Abs 2.2  0.7 - 4.0 K/uL   Monocytes Relative 6  3 - 12 %   Monocytes Absolute 0.2  0.1 - 1.0 K/uL   Eosinophils Relative 3  0 - 5 %   Eosinophils Absolute 0.1  0.0 - 0.7 K/uL   Basophils Relative 1  0 - 1 %   Basophils Absolute 0.0  0.0 - 0.1 K/uL  COMPREHENSIVE METABOLIC PANEL     Status: Abnormal   Collection Time    03/26/13  4:13 PM      Result Value Range   Sodium 137  135 - 145 mEq/L  Potassium 3.3 (*) 3.5 - 5.1 mEq/L   Chloride 101  96 - 112 mEq/L   CO2 24  19 - 32 mEq/L   Glucose, Bld 89  70 - 99 mg/dL   BUN 16  6 - 23 mg/dL   Creatinine, Ser 6.04  0.50 - 1.35 mg/dL   Calcium 9.4  8.4 - 54.0 mg/dL   Total Protein 8.3  6.0 - 8.3 g/dL   Albumin 4.0  3.5 - 5.2  g/dL   AST 20  0 - 37 U/L   ALT 18  0 - 53 U/L   Alkaline Phosphatase 102  39 - 117 U/L   Total Bilirubin 0.6  0.3 - 1.2 mg/dL   GFR calc non Af Amer >90  >90 mL/min   GFR calc Af Amer >90  >90 mL/min  TROPONIN I     Status: None   Collection Time    03/26/13  4:13 PM      Result Value Range   Troponin I <0.30  <0.30 ng/mL  GLUCOSE, CAPILLARY     Status: None   Collection Time    03/26/13  4:27 PM      Result Value Range   Glucose-Capillary 87  70 - 99 mg/dL  POCT I-STAT TROPONIN I     Status: None   Collection Time    03/26/13  4:34 PM      Result Value Range   Troponin i, poc 0.00  0.00 - 0.08 ng/mL   Comment 3           CBC     Status: Abnormal   Collection Time    03/26/13  8:58 PM      Result Value Range   WBC 4.6  4.0 - 10.5 K/uL   RBC 4.36  4.22 - 5.81 MIL/uL   Hemoglobin 12.5 (*) 13.0 - 17.0 g/dL   HCT 98.1 (*) 19.1 - 47.8 %   MCV 81.4  78.0 - 100.0 fL   MCH 28.7  26.0 - 34.0 pg   MCHC 35.2  30.0 - 36.0 g/dL   RDW 29.5  62.1 - 30.8 %   Platelets 189  150 - 400 K/uL  CREATININE, SERUM     Status: None   Collection Time    03/26/13  8:58 PM      Result Value Range   Creatinine, Ser 0.96  0.50 - 1.35 mg/dL   GFR calc non Af Amer >90  >90 mL/min   GFR calc Af Amer >90  >90 mL/min  GLUCOSE, CAPILLARY     Status: Abnormal   Collection Time    03/26/13 10:28 PM      Result Value Range   Glucose-Capillary 267 (*) 70 - 99 mg/dL   Comment 1 Documented in Chart     Comment 2 Notify RN    LIPID PANEL     Status: Abnormal   Collection Time    03/27/13  6:07 AM      Result Value Range   Cholesterol 137  0 - 200 mg/dL   Triglycerides 62  <657 mg/dL   HDL 31 (*) >84 mg/dL   Total CHOL/HDL Ratio 4.4     VLDL 12  0 - 40 mg/dL   LDL Cholesterol 94  0 - 99 mg/dL  GLUCOSE, CAPILLARY     Status: None   Collection Time    03/27/13  6:46 AM      Result Value Range   Glucose-Capillary 95  70 - 99 mg/dL    Radiology/Studies: Dg Chest 2 View  03/26/2013   CLINICAL  DATA:  Headache, high blood pressure.  EXAM: CHEST  2 VIEW  COMPARISON:  09/04/2012  FINDINGS: Prior CABG. Heart is normal size. Lungs are clear. No effusions. No acute bony abnormality.  IMPRESSION: No acute findings.   Electronically Signed   By: Charlett Nose M.D.   On: 03/26/2013 22:00   Ct Head Wo Contrast  03/26/2013   CLINICAL DATA:  Right arm weakness  EXAM: CT HEAD WITHOUT CONTRAST  TECHNIQUE: Contiguous axial images were obtained from the base of the skull through the vertex without intravenous contrast.  COMPARISON:  Brain MRI 10/03/2010  FINDINGS: No skull fracture is noted. Paranasal sinuses and mastoid air cells are unremarkable. No intracranial hemorrhage, mass effect or midline shift. Ventricular size is stable from prior exam. No definite acute cortical infarction. The gray and white-matter differentiation is preserved. No mass lesion is noted on this unenhanced scan.  IMPRESSION: No acute intracranial abnormality. No definite acute cortical infarction.   Electronically Signed   By: Natasha Mead   On: 03/26/2013 17:22   Mr Brain Wo Contrast  03/26/2013   *RADIOLOGY REPORT*  Clinical Data:  Evaluate for stroke.  Trans and right-sided numbness.  History of prior stroke.  MRI HEAD WITHOUT CONTRAST MRA HEAD WITHOUT CONTRAST  Technique:  Multiplanar, multiecho pulse sequences of the brain and surrounding structures were obtained without intravenous contrast. Angiographic images of the head were obtained using MRA technique without contrast.  Comparison:  CT of head March 27, 2003, MRI of the brain October 03, 2010.  MRI HEAD  Findings:  Multiple punctate foci of reduced diffusion within the left frontal parietal lobes, as well as posterior limb of the left internal capsule.  Corresponding low ADC values consistent with acute ischemia.  No definite associated FLAIR abnormality.  The ventricles and sulci are normal for patient's age.  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  No susceptibility  artifact to suggest hemorrhage.  No abnormal intracranial intrinsic shortening; punctate foci of low gradient signal seen in the left thalamus, left parietal lobe most consistent with vascular flow voids.  No mass lesions, mass effect or midline shift.  No abnormal extra-axial fluid collections.  Normal major intracranial vascular flow voids seen at the skull base. No definite extra-axial masses.  Mild paranasal sinus mucosal thickening without air fluid levels. Mastoid air cells appear well aerated.  Ocular globes and orbital contents are not suspicious but not tailored for evaluation.  No suspicious calvarial bone marrow signal.  Craniocervical junction maintained.  IMPRESSION: Multiple punctate foci of acute ischemia within the left middle cerebral artery territory (left frontoparietal lobes).  No hemorrhagic conversion.  Remote right pontine infarct.  MRA HEAD  Findings: Anterior circulation:  Normal flow related enhancement of the included cervical internal carotid arteries, the origin of the petrous segments.  There is diminished flow related enhancement of the distal left petrous internal carotid artery, with very diminished left cavernous carotid artery flow related enhancement. Normal flow related enhancement left carotid terminus, with normal flow related enhancement the anterior and middle cerebral arteries including main branches/segments.  Patent appearing anterior communicating artery.  Posterior circulation:  Nearly codominant vertebral arteries.  The left vertebral artery courses to the right with widely patent branch vessels.  However, at least 50% narrowing of a 5 mm segment the origin of the basilar artery, with mild irregularity of the basilar artery diffusely and  post stenotic dilatation, mildly advanced from prior examination.  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  No discrete left posterior communicating artery is  identified. Mildly diminished flow related enhancement of the more distal right posterior cerebral artery segment.  No aneurysm.  IMPRESSION: Slow flow versus occlusion of the left carotid siphon with inferred retrograde filling of the left anterior circulation via a patent anterior communicating artery.  Normal flow related enhancement of the left middle cerebral arteries and remaining anterior vessels. Findings may be better characterized on CT angiogram the head, as clinically indicated.  At least 50% narrowing at the origin of the basilar artery, with luminal irregularity, suggesting intracranial atherosclerosis, mildly advanced from prior examination.  Critical Value/emergent results were called by telephone at the time of interpretation on March 26, 2013 at 2220 hours to Dr. Noel Christmas, neurology, who verbally acknowledged these results.   Original Report Authenticated By: Awilda Metro   Mr Mra Head/brain Wo Cm  03/26/2013   *RADIOLOGY REPORT*  Clinical Data:  Evaluate for stroke.  Trans and right-sided numbness.  History of prior stroke.  MRI HEAD WITHOUT CONTRAST MRA HEAD WITHOUT CONTRAST  Technique:  Multiplanar, multiecho pulse sequences of the brain and surrounding structures were obtained without intravenous contrast. Angiographic images of the head were obtained using MRA technique without contrast.  Comparison:  CT of head March 27, 2003, MRI of the brain October 03, 2010.  MRI HEAD  Findings:  Multiple punctate foci of reduced diffusion within the left frontal parietal lobes, as well as posterior limb of the left internal capsule.  Corresponding low ADC values consistent with acute ischemia.  No definite associated FLAIR abnormality.  The ventricles and sulci are normal for patient's age.  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  No susceptibility artifact to suggest hemorrhage.  No abnormal intracranial intrinsic shortening; punctate foci of low gradient signal seen in the left  thalamus, left parietal lobe most consistent with vascular flow voids.  No mass lesions, mass effect or midline shift.  No abnormal extra-axial fluid collections.  Normal major intracranial vascular flow voids seen at the skull base. No definite extra-axial masses.  Mild paranasal sinus mucosal thickening without air fluid levels. Mastoid air cells appear well aerated.  Ocular globes and orbital contents are not suspicious but not tailored for evaluation.  No suspicious calvarial bone marrow signal.  Craniocervical junction maintained.  IMPRESSION: Multiple punctate foci of acute ischemia within the left middle cerebral artery territory (left frontoparietal lobes).  No hemorrhagic conversion.  Remote right pontine infarct.  MRA HEAD  Findings: Anterior circulation:  Normal flow related enhancement of the included cervical internal carotid arteries, the origin of the petrous segments.  There is diminished flow related enhancement of the distal left petrous internal carotid artery, with very diminished left cavernous carotid artery flow related enhancement. Normal flow related enhancement left carotid terminus, with normal flow related enhancement the anterior and middle cerebral arteries including main branches/segments.  Patent appearing anterior communicating artery.  Posterior circulation:  Nearly codominant vertebral arteries.  The left vertebral artery courses to the right with widely patent branch vessels.  However, at least 50% narrowing of a 5 mm segment the origin of the basilar artery, with mild irregularity of the basilar artery diffusely and post stenotic dilatation, mildly advanced from prior examination.  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  No discrete left posterior communicating artery is identified.  Mildly diminished flow related enhancement of the more distal right posterior cerebral artery segment.  No aneurysm.  IMPRESSION:  Slow flow versus occlusion of the left carotid siphon with inferred retrograde filling of the left anterior circulation via a patent anterior communicating artery.  Normal flow related enhancement of the left middle cerebral arteries and remaining anterior vessels. Findings may be better characterized on CT angiogram the head, as clinically indicated.  At least 50% narrowing at the origin of the basilar artery, with luminal irregularity, suggesting intracranial atherosclerosis, mildly advanced from prior examination.  Critical Value/emergent results were called by telephone at the time of interpretation on March 26, 2013 at 2220 hours to Dr. Noel Christmas, neurology, who verbally acknowledged these results.   Original Report Authenticated By: Awilda Metro    EKG:  SR  T wave inversion V1/V2, I/AVL.  Unchanged from previous.    ASSESSMENT AND PLAN:   Patient is a 57 yo with known CAD  Last intervention in Sept 2013.  He was last in clinic in March 2014. From a cardiac standpoint he has no symptoms to sugg active ischemia  He is active. Patient does have signif CAD   Without symptoms he is at some increased risk for major cardiac event.  But, feel benefit for surgery of CV disease is significant. Would continue medical Rx  Watch BP/HR closely in the periop period  Avoid hypotension. Will follow with you.  Continue current medical regimen.     SignedDietrich Pates 03/27/2013, 11:09 AM

## 2013-03-28 DIAGNOSIS — I25810 Atherosclerosis of coronary artery bypass graft(s) without angina pectoris: Secondary | ICD-10-CM

## 2013-03-28 LAB — GLUCOSE, CAPILLARY: Glucose-Capillary: 221 mg/dL — ABNORMAL HIGH (ref 70–99)

## 2013-03-28 LAB — CBC
MCH: 28.5 pg (ref 26.0–34.0)
MCHC: 35 g/dL (ref 30.0–36.0)
Platelets: 230 10*3/uL (ref 150–400)
RBC: 4.84 MIL/uL (ref 4.22–5.81)

## 2013-03-28 MED ORDER — HEPARIN (PORCINE) IN NACL 100-0.45 UNIT/ML-% IJ SOLN
1100.0000 [IU]/h | INTRAMUSCULAR | Status: DC
  Administered 2013-03-28: 300 [IU]/h via INTRAVENOUS
  Administered 2013-03-28: 1200 [IU]/h via INTRAVENOUS
  Administered 2013-03-29 (×2): 1100 [IU]/h via INTRAVENOUS
  Filled 2013-03-28 (×4): qty 250

## 2013-03-28 MED ORDER — CEFAZOLIN SODIUM 1-5 GM-% IV SOLN
1.0000 g | INTRAVENOUS | Status: DC
  Filled 2013-03-28: qty 50

## 2013-03-28 MED ORDER — HEPARIN BOLUS VIA INFUSION
3000.0000 [IU] | Freq: Once | INTRAVENOUS | Status: AC
  Administered 2013-03-28: 3000 [IU] via INTRAVENOUS
  Filled 2013-03-28: qty 3000

## 2013-03-28 NOTE — Progress Notes (Signed)
Family Medicine Teaching Service Daily Progress Note Intern Pager: [phone]  Patient name: Shawn Hood Medical record number: [id number] Date of birth: [birth year] Age: 57 y.o. Gender: male  Primary Care Provider: No primary provider on file. Consultants: Neurology, Cardiology, Vascular Surgery Code Status: Full  Pt Overview and Major Events to Date:  10/2: Admitted with TIA/CVA symptoms 10/3: Neuro work up including carotid doppler, echocardiogram  Assessment and Plan: Demtrius Rounds is a 57 y.o. male presenting with Likely TIA. PMH is significant for DMII, HLD, CAD s/p CABG, CVA (L sided deficits), HTN, former smoker.   # TIA: probable left subcortical MCA territory TIA, possibly small vessel ischemia. W/o deficits on exam at admission.  - Possible Left CEA on Monday with Dr. Darrick Penna, stop plavix, continue ASA, Make NPO Sunday night.  - CT negative for inctracranial process  - MRI/MRA: possible L carotid siphon occlusion, no sign of stroke.  - 10/3: 2D Echo LV EF 65-70%, LV normal size, mild regurg of MV,   - OT: cancelled, no needs identified.  - SLP: pt appears at baseline with all communication and cognitive function with no f/u recs - Continue home ASA 325mg  - A1c 9.3 - Lipids (on max lipitor) LDL 94 - Allow permissive HTN (BP too low for ACE as indicated by DM at this time)   # CV: h/o CAD s/p CABG and HTN. W/o CP. Allow HTN as above.  - Cont ASA  - Tele  - Echo as above.  - Cards recs: avoid hypotension, watch BP/HR closely. Continue current medical regimen   # DM: Last A1c 7.5 in March. Would like to see pt closer to 7 if CBG will tolerate. On Lantus 30 and Novolog 10 TID and metformin.  - A1c as above  - Mod. SSI, HS correction - Hold Metformin   # HLD: on lipitor 80 at home  - continue lipitor at max dose  - LDL 94, HDL 31, Trig 62  FEN/GI: Swallow study then diet as above. No role for PPI at this time.  - IVF 54ml/hr. DC in am.  - Senna/miralax PRN    Prophylaxis: Hep Wye TID   Disposition: Pending further neuro work up   Subjective: Pt sitting up in chair bedside his bed. He had no complaints and had no overnight events. He was aware of his surgery scheduled for Monday.   Objective: Temp:  [97.7 F (36.5 C)-98.3 F (36.8 C)] 97.7 F (36.5 C) (10/04 0615) Pulse Rate:  [50-56] 55 (10/04 0615) Resp:  [17-18] 18 (10/04 0615) BP: (124-142)/(61-75) 124/75 mmHg (10/04 0615) SpO2:  [99 %-100 %] 99 % (10/04 0615) Physical Exam: General: 57 yo man in NAD Cardiovascular: RRR no m/r/g,   Respiratory: CTAB, nonlabored  Abdomen: NABS, soft non-tender  Extremities: 2+ pulses, trace edema, well perfused  Skin: warm, intact,   Laboratory:  Recent Labs Lab 03/26/13 1613 03/26/13 2058  WBC 3.7* 4.6  HGB 13.1 12.5*  HCT 38.2* 35.5*  PLT 200 189    Recent Labs Lab 03/26/13 1613 03/26/13 2058  NA 137  --   K 3.3*  --   CL 101  --   CO2 24  --   BUN 16  --   CREATININE 0.86 0.96  CALCIUM 9.4  --   PROT 8.3  --   BILITOT 0.6  --   ALKPHOS 102  --   ALT 18  --   AST 20  --   GLUCOSE 89  --  Recent Labs Lab 03/27/13 0646 03/27/13 1146 03/27/13 1650 03/27/13 2133 03/28/13 0751  GLUCAP 95 154* 170* 163* 221*     Imaging/Diagnostic Tests: Carotid Doppler See angio  Right - 1% to 39% ICA stenosis. Vertebral artery flow is antegrade with elevated velocities suggestive of a more distal obstruction or stenosis.  Left - 80% to 99% ICA stenosis at the distal bulb /proximal ICA. Vertebral artery flow is antegrade.  2D Echo LV EF 65-70%, LV normal size, mild regurg of MV  CTA NECK IMPRESSION Very high-grade stenosis of the left ICA distal bulb,radiographic string-sign, related to bulky and irregular soft and  calcified plaque. The cervical left ICA remains patent. Left CCA stenoses of up to 60%  CTA HEAD IMPRESSION: Bilateral ICA siphon atherosclerotic plaque. Stenosis at the left ICA anterior genu measures less than  50% with respect to the supraclinoid ICA, but might be numerically underestimated. Right ICA siphon without significant stenosis, and both carotid termini are patent.  CT HEAD WITHOUT CONTRAST IMPRESSION:  No acute intracranial abnormality. No definite acute cortical infarction  MRI HEAD IMPRESSION:  Multiple punctate foci of acute ischemia within the left middle  cerebral artery territory (left frontoparietal lobes). No  hemorrhagic conversion.  MRA HEAD IMPRESSION:  Slow flow versus occlusion of the left carotid siphon with inferred  retrograde filling of the left anterior circulation via a patent  anterior communicating artery. Normal flow related enhancement of  the left middle cerebral arteries and remaining anterior vessels.  Findings may be better characterized on CT angiogram the head, as  clinically indicated.  At least 50% narrowing at the origin of the basilar artery, with  luminal irregularity, suggesting intracranial atherosclerosis,  mildly advanced from prior examination.  CXR FINDINGS:  Prior CABG. Heart is normal size. Lungs are clear. No effusions. No  acute bony abnormality.  IMPRESSION:  No acute findings.   Clare Gandy, MD 03/28/2013, 9:00 AM PGY-1, [facility] FPTS Intern pager: [phone], text pages welcome

## 2013-03-28 NOTE — Progress Notes (Addendum)
SUBJECTIVE:  No complaints  OBJECTIVE:   Vitals:   Filed Vitals:   03/27/13 2135 03/28/13 0149 03/28/13 0615 03/28/13 0928  BP: 131/65 132/61 124/75 123/76  Pulse: 56 52 55 53  Temp: 98.3 F (36.8 C) 97.8 F (36.6 C) 97.7 F (36.5 C) 97.9 F (36.6 C)  TempSrc: Oral Oral Oral Oral  Resp: 18 18 18 18   Height:      Weight:      SpO2: 100% 100% 99% 98%   I&O's:  No intake or output data in the 24 hours ending 03/28/13 1040 TELEMETRY: Reviewed telemetry pt in NSR:     PHYSICAL EXAM General: Well developed, well nourished, in no acute distress Head: Eyes PERRLA, No xanthomas.   Normal cephalic and atramatic  Lungs:   Clear bilaterally to auscultation and percussion. Heart:   HRRR S1 S2 Pulses are 2+ & equal. Abdomen: Bowel sounds are positive, abdomen soft and non-tender without masses  Extremities:   No clubbing, cyanosis or edema.  DP +1 Neuro: Alert and oriented X 3. Psych:  Good affect, responds appropriately   LABS: Basic Metabolic Panel:  Recent Labs  82/95/62 1613 03/26/13 2058  NA 137  --   K 3.3*  --   CL 101  --   CO2 24  --   GLUCOSE 89  --   BUN 16  --   CREATININE 0.86 0.96  CALCIUM 9.4  --    Liver Function Tests:  Recent Labs  03/26/13 1613  AST 20  ALT 18  ALKPHOS 102  BILITOT 0.6  PROT 8.3  ALBUMIN 4.0   No results found for this basename: LIPASE, AMYLASE,  in the last 72 hours CBC:  Recent Labs  03/26/13 1613 03/26/13 2058  WBC 3.7* 4.6  NEUTROABS 1.1*  --   HGB 13.1 12.5*  HCT 38.2* 35.5*  MCV 80.8 81.4  PLT 200 189   Cardiac Enzymes:  Recent Labs  03/26/13 1613  TROPONINI <0.30   BNP: No components found with this basename: POCBNP,  D-Dimer: No results found for this basename: DDIMER,  in the last 72 hours Hemoglobin A1C:  Recent Labs  03/27/13 0607  HGBA1C 9.3*   Fasting Lipid Panel:  Recent Labs  03/27/13 0607  CHOL 137  HDL 31*  LDLCALC 94  TRIG 62  CHOLHDL 4.4   Thyroid Function Tests: No  results found for this basename: TSH, T4TOTAL, FREET3, T3FREE, THYROIDAB,  in the last 72 hours Anemia Panel: No results found for this basename: VITAMINB12, FOLATE, FERRITIN, TIBC, IRON, RETICCTPCT,  in the last 72 hours Coag Panel:   Lab Results  Component Value Date   INR 1.04 03/26/2013   INR 0.92 09/04/2012   INR 0.93 02/27/2012    RADIOLOGY: Ct Angio Head W/cm &/or Wo Cm  03/27/2013   CLINICAL DATA:  57 year old male with right side numbness. Found to have multiple small acute left hemisphere infarcts on recent MRI. Abnormal MRA.  EXAM: CT ANGIOGRAPHY NECK AND HEAD  TECHNIQUE: Multidetector CT imaging of the neck was performed using the standard protocol during bolus administration of intravenous contrast. Multiplanar CT image reconstructions including MIPs were obtained to evaluate the vascular anatomy. Carotid stenosis measurements (when applicable) are obtained utilizing NASCET criteria, using the distal internal carotid diameter as the denominator.  CONTRAST:  50mL OMNIPAQUE IOHEXOL 350 MG/ML SOLN  COMPARISON:  MRI/ MRA 8 03/26/2013.  FINDINGS: CTA NECK FINDINGS:  Mild dependent atelectasis in the lungs. Mediastinal lipomatosis. No superior  mediastinal lymphadenopathy.  Thyroid, larynx, pharynx, parapharyngeal spaces, retropharyngeal space, sublingual space, submandibular glands, parotid glands, and orbits are within normal limits. No cervical lymphadenopathy. Mild ethmoid sinus mucosal thickening. Other Visualized paranasal sinuses and mastoids are clear. No acute osseous abnormality identified.  VASCULAR FINDINGS:  3 vessel arch configuration with mild to moderate calcified and soft plaque in the arch.  No significant stenosis at the right common carotid artery origin. Calcified plaque at the right carotid bifurcation and affecting the right ICA bulb. No subsequent stenosis. Mild calcified plaque just below the skullbase. No cervical right ICA stenosis.  No proximal right subclavian artery  stenosis. Soft and calcified plaque at the right vertebral artery origin which is diminutive. The right vertebral artery is diminutive but patent throughout the neck with multiple additional areas of calcified plaque. See intracranial findings below.  No proximal left subclavian artery stenosis. Left vertebral artery origin within normal limits, but the vertebral artery quickly tapers down to a diminutive appearance (series 4, image 36), in the V1 segment. It also is somewhat diminutive throughout the neck, although slightly larger than the right vertebral artery. There is intermittent calcified plaque. But the left vertebral artery remains patent to the skullbase.  Soft and calcified plaque at the left CCA origin resulting in stenosis the of approximately 60 % with respect to the distal vessel. Circumferential soft plaque in the more distal left CCA at the level of the larynx, also 60% stenosis. Widely patent distal left CCA just proximal to the bifurcation. At the bifurcation there is severe calcified and soft plaque. Soft plaque irregularity is noted. This results in a radiographic string-sign of the vessel at the level of the distal bulb with near occlusion (series 4, image 73 and series 16109, image 112).  Despite this the cervical left ICA remains patent. There is calcified plaque at the skullbase.  Review of the MIP images confirms the above findings.  CTA HEAD FINDINGS:  Calvarium intact. Visualized scalp soft tissues are within normal limits. Small diffusion positive foci in the left hemisphere are not evident. No evidence of cortically based acute infarction identified. No midline shift, mass effect, or evidence of intracranial mass lesion. No acute intracranial hemorrhage identified. No ventriculomegaly. No abnormal enhancement identified.  VASCULAR FINDINGS:  Major intracranial venous structures appear patent.  Patent distal vertebral arteries. Extensive plaque in the distal right vertebral artery  (series 4, image 101) resulting in mild to moderate stenosis. Patent vertebrobasilar junction. Both PICA origins are patent. AICA origins are patent.  Just be on the basilar origin there is circumferential narrowing of the basilar which persists over a segment of the about 1 cm. Appearance stable from recent comparison. The distal basilar has an improved caliber. SCA and left PCA origins are within normal limits. Fetal type right PCA origin. Left posterior communicating artery diminutive or absent. Bilateral PCA branches within normal limits.  Moderate soft and calcified plaque throughout the right ICA siphon which remains patent. No hemodynamically significant stenosis. Normal right ophthalmic and posterior communicating artery origins. Patent right carotid terminus. Right MCA and ACA origins are within normal limits.  Patent left ICA siphon with atherosclerotic plaque mostly concentrated in the cavernous segment. Stenosis at the level of the anterior genu measuring less than 50 % with respect to the distal vessel. However, this might be numerically underestimated, see coronal image series 60454, image 75. The left ICA terminus is patent. Left MCA and ACA origins are patent.  Anterior communicating artery is present. Bilateral ACA  branches are within normal limits. Right MCA branches are within normal limits.  PICC the left MCA M1 segment is patent but irregular. There is mild to moderate narrowing in the distal left M1 segment. The left MCA bifurcation is patent. Left MCA M2 branches is are patent without stenosis or major branch occlusion identified.  Review of the MIP images confirms the above findings.  IMPRESSION: CTA NECK IMPRESSION:  1. Very high-grade stenosis of the left ICA distal bulb, radiographic string-sign, related to bulky and irregular soft and calcified plaque. The cervical left ICA remains patent.  2. 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  3. Diminutive  vertebral arteries with bilateral proximal vertebral stenosis (moderate).  4. No significant right cervical carotid stenosis.  CTA HEAD IMPRESSION:  1. Bilateral ICA siphon atherosclerotic plaque. Stenosis at the left ICA anterior genu measures less than 50% with respect to the supra clinoid ICA, but might be numerically underestimated. Right ICA siphon without significant stenosis, and both carotid termini are patent.  2. Mild to moderate irregularity of the left MCA M1 segment, but patent bifurcation and no left MCA branch occlusion identified.  3. Advanced posterior circulation atherosclerosis. Mild to moderate distal right vertebral artery stenosis. 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  4. Stable and negative CT appearance of the brain ; small diffusion positive lesions in the left hemisphere seen yesterday are not yet evident.  Study discussed by telephone with stroke team PA LYNN BROWN on 03/27/2013 at 14:40 .   Electronically Signed   By: Augusto Gamble M.D.   On: 03/27/2013 14:45   Dg Chest 2 View  03/26/2013   CLINICAL DATA:  Headache, high blood pressure.  EXAM: CHEST  2 VIEW  COMPARISON:  09/04/2012  FINDINGS: Prior CABG. Heart is normal size. Lungs are clear. No effusions. No acute bony abnormality.  IMPRESSION: No acute findings.   Electronically Signed   By: Charlett Nose M.D.   On: 03/26/2013 22:00   Ct Head Wo Contrast  03/26/2013   CLINICAL DATA:  Right arm weakness  EXAM: CT HEAD WITHOUT CONTRAST  TECHNIQUE: Contiguous axial images were obtained from the base of the skull through the vertex without intravenous contrast.  COMPARISON:  Brain MRI 10/03/2010  FINDINGS: No skull fracture is noted. Paranasal sinuses and mastoid air cells are unremarkable. No intracranial hemorrhage, mass effect or midline shift. Ventricular size is stable from prior exam. No definite acute cortical infarction. The gray and white-matter differentiation is preserved.  No mass lesion is noted on this unenhanced scan.  IMPRESSION: No acute intracranial abnormality. No definite acute cortical infarction.   Electronically Signed   By: Natasha Mead   On: 03/26/2013 17:22   Ct Angio Neck W/cm &/or Wo/cm  03/27/2013   CLINICAL DATA:  57 year old male with right side numbness. Found to have multiple small acute left hemisphere infarcts on recent MRI. Abnormal MRA.  EXAM: CT ANGIOGRAPHY NECK AND HEAD  TECHNIQUE: Multidetector CT imaging of the neck was performed using the standard protocol during bolus administration of intravenous contrast. Multiplanar CT image reconstructions including MIPs were obtained to evaluate the vascular anatomy. Carotid stenosis measurements (when applicable) are obtained utilizing NASCET criteria, using the distal internal carotid diameter as the denominator.  CONTRAST:  50mL OMNIPAQUE IOHEXOL 350 MG/ML SOLN  COMPARISON:  MRI/ MRA 8 03/26/2013.  FINDINGS: CTA NECK FINDINGS:  Mild dependent atelectasis in the lungs.  Mediastinal lipomatosis. No superior mediastinal lymphadenopathy.  Thyroid, larynx, pharynx, parapharyngeal spaces, retropharyngeal space, sublingual space, submandibular glands, parotid glands, and orbits are within normal limits. No cervical lymphadenopathy. Mild ethmoid sinus mucosal thickening. Other Visualized paranasal sinuses and mastoids are clear. No acute osseous abnormality identified.  VASCULAR FINDINGS:  3 vessel arch configuration with mild to moderate calcified and soft plaque in the arch.  No significant stenosis at the right common carotid artery origin. Calcified plaque at the right carotid bifurcation and affecting the right ICA bulb. No subsequent stenosis. Mild calcified plaque just below the skullbase. No cervical right ICA stenosis.  No proximal right subclavian artery stenosis. Soft and calcified plaque at the right vertebral artery origin which is diminutive. The right vertebral artery is diminutive but patent throughout  the neck with multiple additional areas of calcified plaque. See intracranial findings below.  No proximal left subclavian artery stenosis. Left vertebral artery origin within normal limits, but the vertebral artery quickly tapers down to a diminutive appearance (series 4, image 36), in the V1 segment. It also is somewhat diminutive throughout the neck, although slightly larger than the right vertebral artery. There is intermittent calcified plaque. But the left vertebral artery remains patent to the skullbase.  Soft and calcified plaque at the left CCA origin resulting in stenosis the of approximately 60 % with respect to the distal vessel. Circumferential soft plaque in the more distal left CCA at the level of the larynx, also 60% stenosis. Widely patent distal left CCA just proximal to the bifurcation. At the bifurcation there is severe calcified and soft plaque. Soft plaque irregularity is noted. This results in a radiographic string-sign of the vessel at the level of the distal bulb with near occlusion (series 4, image 73 and series 16109, image 112).  Despite this the cervical left ICA remains patent. There is calcified plaque at the skullbase.  Review of the MIP images confirms the above findings.  CTA HEAD FINDINGS:  Calvarium intact. Visualized scalp soft tissues are within normal limits. Small diffusion positive foci in the left hemisphere are not evident. No evidence of cortically based acute infarction identified. No midline shift, mass effect, or evidence of intracranial mass lesion. No acute intracranial hemorrhage identified. No ventriculomegaly. No abnormal enhancement identified.  VASCULAR FINDINGS:  Major intracranial venous structures appear patent.  Patent distal vertebral arteries. Extensive plaque in the distal right vertebral artery (series 4, image 101) resulting in mild to moderate stenosis. Patent vertebrobasilar junction. Both PICA origins are patent. AICA origins are patent.  Just be on  the basilar origin there is circumferential narrowing of the basilar which persists over a segment of the about 1 cm. Appearance stable from recent comparison. The distal basilar has an improved caliber. SCA and left PCA origins are within normal limits. Fetal type right PCA origin. Left posterior communicating artery diminutive or absent. Bilateral PCA branches within normal limits.  Moderate soft and calcified plaque throughout the right ICA siphon which remains patent. No hemodynamically significant stenosis. Normal right ophthalmic and posterior communicating artery origins. Patent right carotid terminus. Right MCA and ACA origins are within normal limits.  Patent left ICA siphon with atherosclerotic plaque mostly concentrated in the cavernous segment. Stenosis at the level of the anterior genu measuring less than 50 % with respect to the distal vessel. However, this might be numerically underestimated, see coronal image series 60454, image 75. The left ICA terminus is patent. Left MCA and ACA origins are patent.  Anterior communicating artery  is present. Bilateral ACA branches are within normal limits. Right MCA branches are within normal limits.  PICC the left MCA M1 segment is patent but irregular. There is mild to moderate narrowing in the distal left M1 segment. The left MCA bifurcation is patent. Left MCA M2 branches is are patent without stenosis or major branch occlusion identified.  Review of the MIP images confirms the above findings.  IMPRESSION: CTA NECK IMPRESSION:  1. Very high-grade stenosis of the left ICA distal bulb, radiographic string-sign, related to bulky and irregular soft and calcified plaque. The cervical left ICA remains patent.  2. 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  3. Diminutive vertebral arteries with bilateral proximal vertebral stenosis (moderate).  4. No significant right cervical carotid stenosis.  CTA HEAD IMPRESSION:  1. Bilateral ICA  siphon atherosclerotic plaque. Stenosis at the left ICA anterior genu measures less than 50% with respect to the supra clinoid ICA, but might be numerically underestimated. Right ICA siphon without significant stenosis, and both carotid termini are patent.  2. Mild to moderate irregularity of the left MCA M1 segment, but patent bifurcation and no left MCA branch occlusion identified.  3. Advanced posterior circulation atherosclerosis. Mild to moderate distal right vertebral artery stenosis. 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  4. Stable and negative CT appearance of the brain ; small diffusion positive lesions in the left hemisphere seen yesterday are not yet evident.  Study discussed by telephone with stroke team PA LYNN BROWN on 03/27/2013 at 14:40 .   Electronically Signed   By: Augusto Gamble M.D.   On: 03/27/2013 14:45   Mr Brain Wo Contrast  03/26/2013   *RADIOLOGY REPORT*  Clinical Data:  Evaluate for stroke.  Trans and right-sided numbness.  History of prior stroke.  MRI HEAD WITHOUT CONTRAST MRA HEAD WITHOUT CONTRAST  Technique:  Multiplanar, multiecho pulse sequences of the brain and surrounding structures were obtained without intravenous contrast. Angiographic images of the head were obtained using MRA technique without contrast.  Comparison:  CT of head March 27, 2003, MRI of the brain October 03, 2010.  MRI HEAD  Findings:  Multiple punctate foci of reduced diffusion within the left frontal parietal lobes, as well as posterior limb of the left internal capsule.  Corresponding low ADC values consistent with acute ischemia.  No definite associated FLAIR abnormality.  The ventricles and sulci are normal for patient's age.  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  No susceptibility artifact to suggest hemorrhage.  No abnormal intracranial intrinsic shortening; punctate foci of low gradient signal seen in the left thalamus, left parietal lobe most  consistent with vascular flow voids.  No mass lesions, mass effect or midline shift.  No abnormal extra-axial fluid collections.  Normal major intracranial vascular flow voids seen at the skull base. No definite extra-axial masses.  Mild paranasal sinus mucosal thickening without air fluid levels. Mastoid air cells appear well aerated.  Ocular globes and orbital contents are not suspicious but not tailored for evaluation.  No suspicious calvarial bone marrow signal.  Craniocervical junction maintained.  IMPRESSION: Multiple punctate foci of acute ischemia within the left middle cerebral artery territory (left frontoparietal lobes).  No hemorrhagic conversion.  Remote right pontine infarct.  MRA HEAD  Findings: Anterior circulation:  Normal flow related enhancement of the included cervical internal carotid arteries, the origin of the petrous segments.  There is diminished  flow related enhancement of the distal left petrous internal carotid artery, with very diminished left cavernous carotid artery flow related enhancement. Normal flow related enhancement left carotid terminus, with normal flow related enhancement the anterior and middle cerebral arteries including main branches/segments.  Patent appearing anterior communicating artery.  Posterior circulation:  Nearly codominant vertebral arteries.  The left vertebral artery courses to the right with widely patent branch vessels.  However, at least 50% narrowing of a 5 mm segment the origin of the basilar artery, with mild irregularity of the basilar artery diffusely and post stenotic dilatation, mildly advanced from prior examination.  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  No discrete left posterior communicating artery is identified. Mildly diminished flow related enhancement of the more distal right posterior cerebral artery segment.  No aneurysm.  IMPRESSION: Slow flow versus occlusion of the  left carotid siphon with inferred retrograde filling of the left anterior circulation via a patent anterior communicating artery.  Normal flow related enhancement of the left middle cerebral arteries and remaining anterior vessels. Findings may be better characterized on CT angiogram the head, as clinically indicated.  At least 50% narrowing at the origin of the basilar artery, with luminal irregularity, suggesting intracranial atherosclerosis, mildly advanced from prior examination.  Critical Value/emergent results were called by telephone at the time of interpretation on March 26, 2013 at 2220 hours to Dr. Noel Christmas, neurology, who verbally acknowledged these results.   Original Report Authenticated By: Awilda Metro   Mr Mra Head/brain Wo Cm  03/26/2013   *RADIOLOGY REPORT*  Clinical Data:  Evaluate for stroke.  Trans and right-sided numbness.  History of prior stroke.  MRI HEAD WITHOUT CONTRAST MRA HEAD WITHOUT CONTRAST  Technique:  Multiplanar, multiecho pulse sequences of the brain and surrounding structures were obtained without intravenous contrast. Angiographic images of the head were obtained using MRA technique without contrast.  Comparison:  CT of head March 27, 2003, MRI of the brain October 03, 2010.  MRI HEAD  Findings:  Multiple punctate foci of reduced diffusion within the left frontal parietal lobes, as well as posterior limb of the left internal capsule.  Corresponding low ADC values consistent with acute ischemia.  No definite associated FLAIR abnormality.  The ventricles and sulci are normal for patient's age.  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  No susceptibility artifact to suggest hemorrhage.  No abnormal intracranial intrinsic shortening; punctate foci of low gradient signal seen in the left thalamus, left parietal lobe most consistent with vascular flow voids.  No mass lesions, mass effect or midline shift.  No abnormal extra-axial fluid collections.  Normal major  intracranial vascular flow voids seen at the skull base. No definite extra-axial masses.  Mild paranasal sinus mucosal thickening without air fluid levels. Mastoid air cells appear well aerated.  Ocular globes and orbital contents are not suspicious but not tailored for evaluation.  No suspicious calvarial bone marrow signal.  Craniocervical junction maintained.  IMPRESSION: Multiple punctate foci of acute ischemia within the left middle cerebral artery territory (left frontoparietal lobes).  No hemorrhagic conversion.  Remote right pontine infarct.  MRA HEAD  Findings: Anterior circulation:  Normal flow related enhancement of the included cervical internal carotid arteries, the origin of the petrous segments.  There is diminished flow related enhancement of the distal left petrous internal carotid artery, with very diminished left cavernous carotid artery flow related enhancement. Normal flow related enhancement left carotid terminus, with normal flow  related enhancement the anterior and middle cerebral arteries including main branches/segments.  Patent appearing anterior communicating artery.  Posterior circulation:  Nearly codominant vertebral arteries.  The left vertebral artery courses to the right with widely patent branch vessels.  However, at least 50% narrowing of a 5 mm segment the origin of the basilar artery, with mild irregularity of the basilar artery diffusely and post stenotic dilatation, mildly advanced from prior examination.  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  No discrete left posterior communicating artery is identified. Mildly diminished flow related enhancement of the more distal right posterior cerebral artery segment.  No aneurysm.  IMPRESSION: Slow flow versus occlusion of the left carotid siphon with inferred retrograde filling of the left anterior circulation via a patent anterior communicating artery.  Normal flow  related enhancement of the left middle cerebral arteries and remaining anterior vessels. Findings may be better characterized on CT angiogram the head, as clinically indicated.  At least 50% narrowing at the origin of the basilar artery, with luminal irregularity, suggesting intracranial atherosclerosis, mildly advanced from prior examination.  Critical Value/emergent results were called by telephone at the time of interpretation on March 26, 2013 at 2220 hours to Dr. Noel Christmas, neurology, who verbally acknowledged these results.   Original Report Authenticated By: Awilda Metro   ASSESSMENT AND PLAN:  Patient is a 57 yo with known CAD Last intervention in Sept 2013. He was last in clinic in March 2014.  From a cardiac standpoint he has no symptoms to sugg active ischemia He is active.  Patient does have signif CAD Without symptoms he is at some increased risk for major cardiac event. But, feel benefit for surgery of CV disease is significant.  Would continue medical Rx Watch BP/HR closely in the periop period Avoid hypotension.  Will follow with you. Continue current medical regimen.  2D echo with normal LVF and mild MR     Quintella Reichert, MD  03/28/2013  10:40 AM

## 2013-03-28 NOTE — Progress Notes (Signed)
[facility] Attending Admission Note: Shawn Fiero,MD I  have seen and examined this patient, reviewed their chart. I have discussed this patient with the resident. I agree with the resident's findings, assessment and care plan.  

## 2013-03-28 NOTE — Progress Notes (Signed)
ANTICOAGULATION CONSULT NOTE - Initial Consult  Pharmacy Consult for Heparin Indication: left carotid stenosis  No Known Allergies  Patient Measurements: Height: 5\' 11"  (180.3 cm) Weight: 209 lb (94.802 kg) IBW/kg (Calculated) : 75.3 Heparin Dosing Weight:   Vital Signs: Temp: 97.9 F (36.6 C) (10/04 0928) Temp src: Oral (10/04 0928) BP: 123/76 mmHg (10/04 0928) Pulse Rate: 53 (10/04 0928)  Labs:  Recent Labs  03/26/13 1613 03/26/13 2058  HGB 13.1 12.5*  HCT 38.2* 35.5*  PLT 200 189  APTT 27  --   LABPROT 13.4  --   INR 1.04  --   CREATININE 0.86 0.96  TROPONINI <0.30  --     Estimated Creatinine Clearance: 102.2 ml/min (by C-G formula based on Cr of 0.96).   Medical History: Past Medical History  Diagnosis Date  . Diabetes mellitus type 2, uncontrolled, with complications DX: Age 22  . Hypertension   . Coronary artery disease     [facility]era. s/p 3-vessel CABG 2002. b NSTEMI 09/2010 with patent grafts, no obvious culprit, for med rx.  c. DES to SVG-OM, severe diffuse disease per [facility].  c.  DES to SVG to OM  9/13  . Hyperlipidemia   . 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  . Sinus bradycardia   . Myocardial infarction 2002  . Shortness of breath   . Stroke 2006 ?    left arm & leg    Medications:  Scheduled:  . aspirin EC  325 mg Oral Daily  . atorvastatin  80 mg Oral QHS  . [START ON 03/30/2013]  ceFAZolin (ANCEF) IV  1 g Intravenous On Call  . heparin  3,000 Units Intravenous Once  . insulin aspart  0-15 Units Subcutaneous TID WC  . insulin aspart  0-5 Units Subcutaneous QHS  . lisinopril  20 mg Oral Daily    Assessment:56 yr old male presented to ED following transient right sided face numbness and weakness of the right upper extremity. PMH includes DM, HTN, CAD, MI and CVA. A carotid duplex showed left ICA 80-99% stenosis. Pt will be put on heparin until surgery on Mon.  Goal of Therapy:  Heparin level 0.3-0.7  units/ml Monitor platelets by anticoagulation protocol: Yes   Plan:  1 Heparin bolus 3000 units 2 Heparin drip at 1200 units/hr 2 Check heparin level in 8 hrs and then daily. 3 Daily CBC while on heparin.  Eugene Garnet 03/28/2013,2:18 PM

## 2013-03-28 NOTE — Consult Note (Signed)
Pt seen and examined No neuro events  PE:  Filed Vitals:   03/27/13 2135 03/28/13 0149 03/28/13 0615 03/28/13 0928  BP: 131/65 132/61 124/75 123/76  Pulse: 56 52 55 53  Temp: 98.3 F (36.8 C) 97.8 F (36.6 C) 97.7 F (36.5 C) 97.9 F (36.6 C)  TempSrc: Oral Oral Oral Oral  Resp: 18 18 18 18   Height:      Weight:      SpO2: 100% 100% 99% 98%    Upper and lower extremities 5/5 motor  DATA: CT reviewed ICA patent with reasonable segment beyond stenosis, some intracranial disease Minimal right stenosis high grade left stenosis  A: symptomatic left ICA stenosis P; Left CEA on Monday by partner Dr Arbie Cookey.  Risks benefits complications explained to pt and wife.  1-2% stroke risk.  Will keep on heparin over weekend to reduce risk of thrombosis.  Will stop heparin on call to OR on Monday.  Continue aspirin. Hold plavix  Fabienne Bruns, MD [facility] Office: [phone] Pager: [phone]

## 2013-03-28 NOTE — Progress Notes (Signed)
Stroke Team Progress Note  HISTORY Shawn Hood is a 57 y.o. male with a history of diabetes mellitus, hypertension, hyperlipidemia, previous stroke and coronary artery disease, presenting 03/26/2013 with history of transient numbness involving right upper extremity and right side of the face as well as weakness and coordination abnormality of right upper extremity. Initial symptoms occurred night of 03/25/2013 at about 9 PM and lasted about 15 minutes involving numbness of his right hand. It recurrent numbness at 9 AM 03/26/2013 involving right upper extremity and right side of his face. He also noticed difficulty lifting his right arm as well as coordination difficulty of his right upper extremity. Symptoms again lasted about 15 minutes then resolved completely. CT scan of his head showed no acute intracranial abnormality. Patient has been taking aspirin and Plavix daily. NIH stroke score on admission was 0. He was last known well at 9 AM 03/26/2013.   LSN: 9 AM on 03/26/2013  tPA Given: No: Deficits resolved  MRankin: 0  Patient was not a TPA candidate secondary to resolution of deficits. He was admitted for further evaluation and treatment.  SUBJECTIVE  No family members present. Pt without complaints. Surgery Monday.  OBJECTIVE Most recent Vital Signs: Filed Vitals:   03/27/13 1753 03/27/13 2135 03/28/13 0149 03/28/13 0615  BP: 125/62 131/65 132/61 124/75  Pulse: 55 56 52 55  Temp: 98.3 F (36.8 C) 98.3 F (36.8 C) 97.8 F (36.6 C) 97.7 F (36.5 C)  TempSrc: Oral Oral Oral Oral  Resp: 17 18 18 18   Height:      Weight:      SpO2: 100% 100% 100% 99%   CBG (last 3)   Recent Labs  03/27/13 1650 03/27/13 2133 03/28/13 0751  GLUCAP 170* 163* 221*    IV Fluid Intake:   . sodium chloride 1,000 mL (03/26/13 2029)    MEDICATIONS  . aspirin EC  325 mg Oral Daily  . atorvastatin  80 mg Oral QHS  . heparin  5,000 Units Subcutaneous Q8H  . influenza vac split quadrivalent PF  0.5  mL Intramuscular Tomorrow-1000  . insulin aspart  0-15 Units Subcutaneous TID WC  . insulin aspart  0-5 Units Subcutaneous QHS  . lisinopril  20 mg Oral Daily   PRN:  [id number]  Diet:  Cardiac thin liquids Activity:  OOB with assist DVT Prophylaxis:  Heparin SQ  CLINICALLY SIGNIFICANT STUDIES Basic Metabolic Panel:   Recent Labs Lab 03/26/13 1613 03/26/13 2058  NA 137  --   K 3.3*  --   CL 101  --   CO2 24  --   GLUCOSE 89  --   BUN 16  --   CREATININE 0.86 0.96  CALCIUM 9.4  --    Liver Function Tests:   Recent Labs Lab 03/26/13 1613  AST 20  ALT 18  ALKPHOS 102  BILITOT 0.6  PROT 8.3  ALBUMIN 4.0   CBC:   Recent Labs Lab 03/26/13 1613 03/26/13 2058  WBC 3.7* 4.6  NEUTROABS 1.1*  --   HGB 13.1 12.5*  HCT 38.2* 35.5*  MCV 80.8 81.4  PLT 200 189   Coagulation:   Recent Labs Lab 03/26/13 1613  LABPROT 13.4  INR 1.04   Cardiac Enzymes:   Recent Labs Lab 03/26/13 1613  TROPONINI <0.30   Urinalysis: No results found for this basename: COLORURINE, APPERANCEUR, LABSPEC, PHURINE, GLUCOSEU, HGBUR, BILIRUBINUR, KETONESUR, PROTEINUR, UROBILINOGEN, NITRITE, LEUKOCYTESUR,  in the last 168 hours Lipid Panel    Component Value  Date/Time   CHOL 137 03/27/2013 0607   TRIG 62 03/27/2013 0607   HDL 31* 03/27/2013 0607   CHOLHDL 4.4 03/27/2013 0607   VLDL 12 03/27/2013 0607   LDLCALC 94 03/27/2013 0607   HgbA1C  Lab Results  Component Value Date   HGBA1C 9.3* 03/27/2013    Urine Drug Screen:   No results found for this basename: labopia,  cocainscrnur,  labbenz,  amphetmu,  thcu,  labbarb    Alcohol Level: No results found for this basename: ETH,  in the last 168 hours  Ct Angio Head W/cm &/or Wo Cm 03/27/2013     1. Bilateral ICA siphon atherosclerotic plaque. Stenosis at the left ICA anterior genu measures less than 50% with respect to the supra clinoid ICA, but might be numerically underestimated. Right ICA siphon without significant stenosis, and  both carotid termini are patent.  2. Mild to moderate irregularity of the left MCA M1 segment, but patent bifurcation and no left MCA branch occlusion identified.  3. Advanced posterior circulation atherosclerosis. Mild to moderate distal right vertebral artery stenosis. 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  4. Stable and negative CT appearance of the brain ; small diffusion positive lesions in the left hemisphere seen yesterday are not yet evident.   Dg Chest 2 View 03/26/2013 No acute findings.    Ct Head Wo Contrast 03/26/2013   No acute intracranial abnormality. No definite acute cortical infarction.    Ct Angio Neck W/cm &/or Wo/cm 03/27/2013    1. Very high-grade stenosis of the left ICA distal bulb, radiographic string-sign, related to bulky and irregular soft and calcified plaque. The cervical left ICA remains patent.  2. 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  3. Diminutive vertebral arteries with bilateral proximal vertebral stenosis (moderate).  4. No significant right cervical carotid stenosis.    Mr Brain Wo Contrast 03/26/2013    Multiple punctate foci of acute ischemia within the left middle cerebral artery territory (left frontoparietal lobes).  No hemorrhagic conversion.  Remote right pontine infarct.   Mr [name]/brain Wo Cm 03/26/2013    Slow flow versus occlusion of the left carotid siphon with inferred retrograde filling of the left anterior circulation via a patent anterior communicating artery.  Normal flow related enhancement of the left middle cerebral arteries and remaining anterior vessels. Findings may be better characterized on CT angiogram the head, as clinically indicated.  At least 50% narrowing at the origin of the basilar artery, with luminal irregularity, suggesting intracranial atherosclerosis, mildly advanced from prior examination.   2D Echocardiogram  EF 65%, no wall motion  abnormalities, LA mildly dilated.  Carotid Doppler  See angio Right - 1% to 39% ICA stenosis. Vertebral artery flow is antegrade with elevated velocities suggestive of a more distal obstruction or stenosis.  Left - 80% to 99% ICA stenosis at the distal bulb /proximal ICA. Vertebral artery flow is antegrade.  CXR    EKG  normal sinus rhythm.   Therapy Recommendations - no followup therapy is recommended  Physical Exam   Neurologic Examination:  Mental Status:  Alert, oriented, thought content appropriate. Speech fluent without evidence of aphasia. Able to follow commands without difficulty.  Cranial Nerves:  II-Visual fields were normal.  III/IV/VI-Pupils were equal and reacted. Extraocular movements were full and conjugate.  V/VII-no facial numbness and no facial weakness.  VIII-normal.  X-normal speech and symmetrical palatal movement.  Motor: Slightly reduced hand grip on the left as well as mild left hip flexor weakness (residual from previous stroke); motor exam otherwise unremarkable  Sensory: Reduced perception of touch as well as vibration below the knees bilaterally.  Deep Tendon Reflexes: Asymmetric with slightly greater responses elicited from left extremities compared to right extremities; right ankle jerk was absent.  Plantars: Extensor on the left and flexor on the right  Cerebellar: Normal finger-to-nose testing bilaterally.  Carotid auscultation: Normal   ASSESSMENT Mr. Delane Wessinger is a 57 y.o. male presenting with transient sensory loss and hemiparesis of right upper extremity and face for 15 minutes on several occasions, symptoms resolved. Imaging confirms a multiple punctate foci of acute ischemia within the left middle cerebral artery territory, frontoparietal lobes. Infarct felt to be thrombotic secondary to severe left carotid artery stenosis.  On aspirin 325 mg orally every day and clopidogrel 75 mg orally every day prior to admission. Now on aspirin 325 mg orally  every day for secondary stroke prevention. Patient with resultant resolved symptoms. Work up underway.   Left carotid stenosis, symptomatic  Diabetes mellitus, type 2, uncontrolled, HGB A1C  9.3  Dyslipidemia, LDL 94, goal < 70, on statin  Hypertension  Coronary artery disease   Previous stroke   Hospital day # 2  TREATMENT/PLAN  Continue aspirin 325 mg orally every day for secondary stroke prevention.  [facility] consulted  Cardiology has cleared patient for surgery.  Per Dr. Darrick Penna - Left carotid endarterectomy on Monday.  D/W Dr Darrick Penna and patient  Delton See PA-C [facility] Pager [phone] 03/28/2013, 8:01 AM  I have personally obtained a history, examined the patient, evaluated imaging results, and formulated the assessment and plan of care. I agree with the above.  Delia Heady, MD

## 2013-03-29 DIAGNOSIS — R001 Bradycardia, unspecified: Secondary | ICD-10-CM | POA: Diagnosis not present

## 2013-03-29 DIAGNOSIS — E785 Hyperlipidemia, unspecified: Secondary | ICD-10-CM

## 2013-03-29 DIAGNOSIS — I498 Other specified cardiac arrhythmias: Secondary | ICD-10-CM

## 2013-03-29 LAB — BASIC METABOLIC PANEL
Calcium: 9.6 mg/dL (ref 8.4–10.5)
Chloride: 101 mEq/L (ref 96–112)
GFR calc non Af Amer: >90 mL/min (ref >90)
Potassium: 3.8 mEq/L (ref 3.5–5.1)
Sodium: 138 mEq/L (ref 135–145)

## 2013-03-29 LAB — HEPARIN LEVEL (UNFRACTIONATED)
Heparin Unfractionated: 0.82 IU/mL — ABNORMAL HIGH (ref 0.30–0.70)
Heparin Unfractionated: 0.24 IU/mL — ABNORMAL LOW (ref 0.30–0.70)

## 2013-03-29 LAB — GLUCOSE, CAPILLARY
Glucose-Capillary: 132 mg/dL — ABNORMAL HIGH (ref 70–99)
Glucose-Capillary: 105 mg/dL — ABNORMAL HIGH (ref 70–99)
Glucose-Capillary: 237 mg/dL — ABNORMAL HIGH (ref 70–99)
Glucose-Capillary: 166 mg/dL — ABNORMAL HIGH (ref 70–99)

## 2013-03-29 MED ORDER — HEPARIN (PORCINE) IN NACL 100-0.45 UNIT/ML-% IJ SOLN
800.0000 [IU]/h | INTRAMUSCULAR | Status: DC
  Administered 2013-03-29: 800 [IU]/h via INTRAVENOUS
  Filled 2013-03-29 (×2): qty 250

## 2013-03-29 MED ORDER — CEFAZOLIN SODIUM-DEXTROSE 2-3 GM-% IV SOLR
2.0000 g | INTRAVENOUS | Status: AC
  Administered 2013-03-30: 2 g via INTRAVENOUS
  Filled 2013-03-29: qty 50

## 2013-03-29 MED ORDER — HEPARIN (PORCINE) IN NACL 100-0.45 UNIT/ML-% IJ SOLN
700.0000 [IU]/h | INTRAMUSCULAR | Status: DC
  Administered 2013-03-29: 700 [IU]/h via INTRAVENOUS
  Filled 2013-03-29: qty 250

## 2013-03-29 NOTE — Progress Notes (Signed)
SUBJECTIVE:  No complaints OBJECTIVE:   Vitals:   Filed Vitals:   03/28/13 2200 03/29/13 0200 03/29/13 0600 03/29/13 0937  BP: 137/72 128/67 130/70 127/72  Pulse: 49 50 45 49  Temp: 97.8 F (36.6 C) 97.7 F (36.5 C) 98.2 F (36.8 C) 97.8 F (36.6 C)  TempSrc:    Oral  Resp: 18 18 18 18   Height:      Weight:      SpO2: 100% 99% 100% 100%   I&O's:  No intake or output data in the 24 hours ending 03/29/13 1050 TELEMETRY: Reviewed telemetry pt in sinus bradycardia     PHYSICAL EXAM General: Well developed, well nourished, in no acute distress Head: Eyes PERRLA, No xanthomas.   Normal cephalic and atramatic  Lungs:   Clear bilaterally to auscultation and percussion. Heart:   HRRR S1 S2 Pulses are 2+ & equal. Abdomen: Bowel sounds are positive, abdomen soft and non-tender without masses  Extremities:   No clubbing, cyanosis or edema.  DP +1 Neuro: Alert and oriented X 3. Psych:  Good affect, responds appropriately   LABS: Basic Metabolic Panel:  Recent Labs  45/40/98 1613 03/26/13 2058  NA 137  --   K 3.3*  --   CL 101  --   CO2 24  --   GLUCOSE 89  --   BUN 16  --   CREATININE 0.86 0.96  CALCIUM 9.4  --    Liver Function Tests:  Recent Labs  03/26/13 1613  AST 20  ALT 18  ALKPHOS 102  BILITOT 0.6  PROT 8.3  ALBUMIN 4.0   No results found for this basename: LIPASE, AMYLASE,  in the last 72 hours CBC:  Recent Labs  03/26/13 1613 03/26/13 2058 03/28/13 1615  WBC 3.7* 4.6 5.4  NEUTROABS 1.1*  --   --   HGB 13.1 12.5* 13.8  HCT 38.2* 35.5* 39.4  MCV 80.8 81.4 81.4  PLT 200 189 230   Cardiac Enzymes:  Recent Labs  03/26/13 1613  TROPONINI <0.30   BNP: No components found with this basename: POCBNP,  D-Dimer: No results found for this basename: DDIMER,  in the last 72 hours Hemoglobin A1C:  Recent Labs  03/27/13 0607  HGBA1C 9.3*   Fasting Lipid Panel:  Recent Labs  03/27/13 0607  CHOL 137  HDL 31*  LDLCALC 94  TRIG 62    CHOLHDL 4.4   Thyroid Function Tests: No results found for this basename: TSH, T4TOTAL, FREET3, T3FREE, THYROIDAB,  in the last 72 hours Anemia Panel: No results found for this basename: VITAMINB12, FOLATE, FERRITIN, TIBC, IRON, RETICCTPCT,  in the last 72 hours Coag Panel:   Lab Results  Component Value Date   INR 1.04 03/26/2013   INR 0.92 09/04/2012   INR 0.93 02/27/2012    RADIOLOGY: Ct Angio Head W/cm &/or Wo Cm  03/27/2013   CLINICAL DATA:  57 year old male with right side numbness. Found to have multiple small acute left hemisphere infarcts on recent MRI. Abnormal MRA.  EXAM: CT ANGIOGRAPHY NECK AND HEAD  TECHNIQUE: Multidetector CT imaging of the neck was performed using the standard protocol during bolus administration of intravenous contrast. Multiplanar CT image reconstructions including MIPs were obtained to evaluate the vascular anatomy. Carotid stenosis measurements (when applicable) are obtained utilizing NASCET criteria, using the distal internal carotid diameter as the denominator.  CONTRAST:  50mL OMNIPAQUE IOHEXOL 350 MG/ML SOLN  COMPARISON:  MRI/ MRA 8 03/26/2013.  FINDINGS: CTA NECK FINDINGS:  Mild dependent atelectasis in the lungs. Mediastinal lipomatosis. No superior mediastinal lymphadenopathy.  Thyroid, larynx, pharynx, parapharyngeal spaces, retropharyngeal space, sublingual space, submandibular glands, parotid glands, and orbits are within normal limits. No cervical lymphadenopathy. Mild ethmoid sinus mucosal thickening. Other Visualized paranasal sinuses and mastoids are clear. No acute osseous abnormality identified.  VASCULAR FINDINGS:  3 vessel arch configuration with mild to moderate calcified and soft plaque in the arch.  No significant stenosis at the right common carotid artery origin. Calcified plaque at the right carotid bifurcation and affecting the right ICA bulb. No subsequent stenosis. Mild calcified plaque just below the skullbase. No cervical right ICA  stenosis.  No proximal right subclavian artery stenosis. Soft and calcified plaque at the right vertebral artery origin which is diminutive. The right vertebral artery is diminutive but patent throughout the neck with multiple additional areas of calcified plaque. See intracranial findings below.  No proximal left subclavian artery stenosis. Left vertebral artery origin within normal limits, but the vertebral artery quickly tapers down to a diminutive appearance (series 4, image 36), in the V1 segment. It also is somewhat diminutive throughout the neck, although slightly larger than the right vertebral artery. There is intermittent calcified plaque. But the left vertebral artery remains patent to the skullbase.  Soft and calcified plaque at the left CCA origin resulting in stenosis the of approximately 60 % with respect to the distal vessel. Circumferential soft plaque in the more distal left CCA at the level of the larynx, also 60% stenosis. Widely patent distal left CCA just proximal to the bifurcation. At the bifurcation there is severe calcified and soft plaque. Soft plaque irregularity is noted. This results in a radiographic string-sign of the vessel at the level of the distal bulb with near occlusion (series 4, image 73 and series 16109, image 112).  Despite this the cervical left ICA remains patent. There is calcified plaque at the skullbase.  Review of the MIP images confirms the above findings.  CTA HEAD FINDINGS:  Calvarium intact. Visualized scalp soft tissues are within normal limits. Small diffusion positive foci in the left hemisphere are not evident. No evidence of cortically based acute infarction identified. No midline shift, mass effect, or evidence of intracranial mass lesion. No acute intracranial hemorrhage identified. No ventriculomegaly. No abnormal enhancement identified.  VASCULAR FINDINGS:  Major intracranial venous structures appear patent.  Patent distal vertebral arteries. Extensive  plaque in the distal right vertebral artery (series 4, image 101) resulting in mild to moderate stenosis. Patent vertebrobasilar junction. Both PICA origins are patent. AICA origins are patent.  Just be on the basilar origin there is circumferential narrowing of the basilar which persists over a segment of the about 1 cm. Appearance stable from recent comparison. The distal basilar has an improved caliber. SCA and left PCA origins are within normal limits. Fetal type right PCA origin. Left posterior communicating artery diminutive or absent. Bilateral PCA branches within normal limits.  Moderate soft and calcified plaque throughout the right ICA siphon which remains patent. No hemodynamically significant stenosis. Normal right ophthalmic and posterior communicating artery origins. Patent right carotid terminus. Right MCA and ACA origins are within normal limits.  Patent left ICA siphon with atherosclerotic plaque mostly concentrated in the cavernous segment. Stenosis at the level of the anterior genu measuring less than 50 % with respect to the distal vessel. However, this might be numerically underestimated, see coronal image series 60454, image 75. The left ICA terminus is patent. Left MCA and ACA origins  are patent.  Anterior communicating artery is present. Bilateral ACA branches are within normal limits. Right MCA branches are within normal limits.  PICC the left MCA M1 segment is patent but irregular. There is mild to moderate narrowing in the distal left M1 segment. The left MCA bifurcation is patent. Left MCA M2 branches is are patent without stenosis or major branch occlusion identified.  Review of the MIP images confirms the above findings.  IMPRESSION: CTA NECK IMPRESSION:  1. Very high-grade stenosis of the left ICA distal bulb, radiographic string-sign, related to bulky and irregular soft and calcified plaque. The cervical left ICA remains patent.  2. 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  3. Diminutive vertebral arteries with bilateral proximal vertebral stenosis (moderate).  4. No significant right cervical carotid stenosis.  CTA HEAD IMPRESSION:  1. Bilateral ICA siphon atherosclerotic plaque. Stenosis at the left ICA anterior genu measures less than 50% with respect to the supra clinoid ICA, but might be numerically underestimated. Right ICA siphon without significant stenosis, and both carotid termini are patent.  2. Mild to moderate irregularity of the left MCA M1 segment, but patent bifurcation and no left MCA branch occlusion identified.  3. Advanced posterior circulation atherosclerosis. Mild to moderate distal right vertebral artery stenosis. 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  4. Stable and negative CT appearance of the brain ; small diffusion positive lesions in the left hemisphere seen yesterday are not yet evident.  Study discussed by telephone with stroke team PA LYNN BROWN on 03/27/2013 at 14:40 .   Electronically Signed   By: Augusto Gamble M.D.   On: 03/27/2013 14:45   Dg Chest 2 View  03/26/2013   CLINICAL DATA:  Headache, high blood pressure.  EXAM: CHEST  2 VIEW  COMPARISON:  09/04/2012  FINDINGS: Prior CABG. Heart is normal size. Lungs are clear. No effusions. No acute bony abnormality.  IMPRESSION: No acute findings.   Electronically Signed   By: Charlett Nose M.D.   On: 03/26/2013 22:00   Ct Head Wo Contrast  03/26/2013   CLINICAL DATA:  Right arm weakness  EXAM: CT HEAD WITHOUT CONTRAST  TECHNIQUE: Contiguous axial images were obtained from the base of the skull through the vertex without intravenous contrast.  COMPARISON:  Brain MRI 10/03/2010  FINDINGS: No skull fracture is noted. Paranasal sinuses and mastoid air cells are unremarkable. No intracranial hemorrhage, mass effect or midline shift. Ventricular size is stable from prior exam. No definite acute cortical infarction. The gray and  white-matter differentiation is preserved. No mass lesion is noted on this unenhanced scan.  IMPRESSION: No acute intracranial abnormality. No definite acute cortical infarction.   Electronically Signed   By: Natasha Mead   On: 03/26/2013 17:22   Ct Angio Neck W/cm &/or Wo/cm  03/27/2013   CLINICAL DATA:  57 year old male with right side numbness. Found to have multiple small acute left hemisphere infarcts on recent MRI. Abnormal MRA.  EXAM: CT ANGIOGRAPHY NECK AND HEAD  TECHNIQUE: Multidetector CT imaging of the neck was performed using the standard protocol during bolus administration of intravenous contrast. Multiplanar CT image reconstructions including MIPs were obtained to evaluate the vascular anatomy. Carotid stenosis measurements (when applicable) are obtained utilizing NASCET criteria, using the distal internal carotid diameter as the denominator.  CONTRAST:  50mL OMNIPAQUE IOHEXOL 350 MG/ML SOLN  COMPARISON:  MRI/ MRA 8 03/26/2013.  FINDINGS:  CTA NECK FINDINGS:  Mild dependent atelectasis in the lungs. Mediastinal lipomatosis. No superior mediastinal lymphadenopathy.  Thyroid, larynx, pharynx, parapharyngeal spaces, retropharyngeal space, sublingual space, submandibular glands, parotid glands, and orbits are within normal limits. No cervical lymphadenopathy. Mild ethmoid sinus mucosal thickening. Other Visualized paranasal sinuses and mastoids are clear. No acute osseous abnormality identified.  VASCULAR FINDINGS:  3 vessel arch configuration with mild to moderate calcified and soft plaque in the arch.  No significant stenosis at the right common carotid artery origin. Calcified plaque at the right carotid bifurcation and affecting the right ICA bulb. No subsequent stenosis. Mild calcified plaque just below the skullbase. No cervical right ICA stenosis.  No proximal right subclavian artery stenosis. Soft and calcified plaque at the right vertebral artery origin which is diminutive. The right vertebral  artery is diminutive but patent throughout the neck with multiple additional areas of calcified plaque. See intracranial findings below.  No proximal left subclavian artery stenosis. Left vertebral artery origin within normal limits, but the vertebral artery quickly tapers down to a diminutive appearance (series 4, image 36), in the V1 segment. It also is somewhat diminutive throughout the neck, although slightly larger than the right vertebral artery. There is intermittent calcified plaque. But the left vertebral artery remains patent to the skullbase.  Soft and calcified plaque at the left CCA origin resulting in stenosis the of approximately 60 % with respect to the distal vessel. Circumferential soft plaque in the more distal left CCA at the level of the larynx, also 60% stenosis. Widely patent distal left CCA just proximal to the bifurcation. At the bifurcation there is severe calcified and soft plaque. Soft plaque irregularity is noted. This results in a radiographic string-sign of the vessel at the level of the distal bulb with near occlusion (series 4, image 73 and series 16109, image 112).  Despite this the cervical left ICA remains patent. There is calcified plaque at the skullbase.  Review of the MIP images confirms the above findings.  CTA HEAD FINDINGS:  Calvarium intact. Visualized scalp soft tissues are within normal limits. Small diffusion positive foci in the left hemisphere are not evident. No evidence of cortically based acute infarction identified. No midline shift, mass effect, or evidence of intracranial mass lesion. No acute intracranial hemorrhage identified. No ventriculomegaly. No abnormal enhancement identified.  VASCULAR FINDINGS:  Major intracranial venous structures appear patent.  Patent distal vertebral arteries. Extensive plaque in the distal right vertebral artery (series 4, image 101) resulting in mild to moderate stenosis. Patent vertebrobasilar junction. Both PICA origins are  patent. AICA origins are patent.  Just be on the basilar origin there is circumferential narrowing of the basilar which persists over a segment of the about 1 cm. Appearance stable from recent comparison. The distal basilar has an improved caliber. SCA and left PCA origins are within normal limits. Fetal type right PCA origin. Left posterior communicating artery diminutive or absent. Bilateral PCA branches within normal limits.  Moderate soft and calcified plaque throughout the right ICA siphon which remains patent. No hemodynamically significant stenosis. Normal right ophthalmic and posterior communicating artery origins. Patent right carotid terminus. Right MCA and ACA origins are within normal limits.  Patent left ICA siphon with atherosclerotic plaque mostly concentrated in the cavernous segment. Stenosis at the level of the anterior genu measuring less than 50 % with respect to the distal vessel. However, this might be numerically underestimated, see coronal image series 60454, image 75. The left ICA terminus is patent. Left  MCA and ACA origins are patent.  Anterior communicating artery is present. Bilateral ACA branches are within normal limits. Right MCA branches are within normal limits.  PICC the left MCA M1 segment is patent but irregular. There is mild to moderate narrowing in the distal left M1 segment. The left MCA bifurcation is patent. Left MCA M2 branches is are patent without stenosis or major branch occlusion identified.  Review of the MIP images confirms the above findings.  IMPRESSION: CTA NECK IMPRESSION:  1. Very high-grade stenosis of the left ICA distal bulb, radiographic string-sign, related to bulky and irregular soft and calcified plaque. The cervical left ICA remains patent.  2. 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  3. Diminutive vertebral arteries with bilateral proximal vertebral stenosis (moderate).  4. No significant right cervical carotid stenosis.   CTA HEAD IMPRESSION:  1. Bilateral ICA siphon atherosclerotic plaque. Stenosis at the left ICA anterior genu measures less than 50% with respect to the supra clinoid ICA, but might be numerically underestimated. Right ICA siphon without significant stenosis, and both carotid termini are patent.  2. Mild to moderate irregularity of the left MCA M1 segment, but patent bifurcation and no left MCA branch occlusion identified.  3. Advanced posterior circulation atherosclerosis. Mild to moderate distal right vertebral artery stenosis. 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  4. Stable and negative CT appearance of the brain ; small diffusion positive lesions in the left hemisphere seen yesterday are not yet evident.  Study discussed by telephone with stroke team PA LYNN BROWN on 03/27/2013 at 14:40 .   Electronically Signed   By: Augusto Gamble M.D.   On: 03/27/2013 14:45   Mr Brain Wo Contrast  03/26/2013   *RADIOLOGY REPORT*  Clinical Data:  Evaluate for stroke.  Trans and right-sided numbness.  History of prior stroke.  MRI HEAD WITHOUT CONTRAST MRA HEAD WITHOUT CONTRAST  Technique:  Multiplanar, multiecho pulse sequences of the brain and surrounding structures were obtained without intravenous contrast. Angiographic images of the head were obtained using MRA technique without contrast.  Comparison:  CT of head March 27, 2003, MRI of the brain October 03, 2010.  MRI HEAD  Findings:  Multiple punctate foci of reduced diffusion within the left frontal parietal lobes, as well as posterior limb of the left internal capsule.  Corresponding low ADC values consistent with acute ischemia.  No definite associated FLAIR abnormality.  The ventricles and sulci are normal for patient's age.  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  No susceptibility artifact to suggest hemorrhage.  No abnormal intracranial intrinsic shortening; punctate foci of low gradient signal seen in the  left thalamus, left parietal lobe most consistent with vascular flow voids.  No mass lesions, mass effect or midline shift.  No abnormal extra-axial fluid collections.  Normal major intracranial vascular flow voids seen at the skull base. No definite extra-axial masses.  Mild paranasal sinus mucosal thickening without air fluid levels. Mastoid air cells appear well aerated.  Ocular globes and orbital contents are not suspicious but not tailored for evaluation.  No suspicious calvarial bone marrow signal.  Craniocervical junction maintained.  IMPRESSION: Multiple punctate foci of acute ischemia within the left middle cerebral artery territory (left frontoparietal lobes).  No hemorrhagic conversion.  Remote right pontine infarct.  MRA HEAD  Findings: Anterior circulation:  Normal flow related enhancement of the included cervical internal carotid arteries,  the origin of the petrous segments.  There is diminished flow related enhancement of the distal left petrous internal carotid artery, with very diminished left cavernous carotid artery flow related enhancement. Normal flow related enhancement left carotid terminus, with normal flow related enhancement the anterior and middle cerebral arteries including main branches/segments.  Patent appearing anterior communicating artery.  Posterior circulation:  Nearly codominant vertebral arteries.  The left vertebral artery courses to the right with widely patent branch vessels.  However, at least 50% narrowing of a 5 mm segment the origin of the basilar artery, with mild irregularity of the basilar artery diffusely and post stenotic dilatation, mildly advanced from prior examination.  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  No discrete left posterior communicating artery is identified. Mildly diminished flow related enhancement of the more distal right posterior cerebral artery segment.  No aneurysm.   IMPRESSION: Slow flow versus occlusion of the left carotid siphon with inferred retrograde filling of the left anterior circulation via a patent anterior communicating artery.  Normal flow related enhancement of the left middle cerebral arteries and remaining anterior vessels. Findings may be better characterized on CT angiogram the head, as clinically indicated.  At least 50% narrowing at the origin of the basilar artery, with luminal irregularity, suggesting intracranial atherosclerosis, mildly advanced from prior examination.  Critical Value/emergent results were called by telephone at the time of interpretation on March 26, 2013 at 2220 hours to Dr. Noel Christmas, neurology, who verbally acknowledged these results.   Original Report Authenticated By: Awilda Metro   Mr Mra Head/brain Wo Cm  03/26/2013   *RADIOLOGY REPORT*  Clinical Data:  Evaluate for stroke.  Trans and right-sided numbness.  History of prior stroke.  MRI HEAD WITHOUT CONTRAST MRA HEAD WITHOUT CONTRAST  Technique:  Multiplanar, multiecho pulse sequences of the brain and surrounding structures were obtained without intravenous contrast. Angiographic images of the head were obtained using MRA technique without contrast.  Comparison:  CT of head March 27, 2003, MRI of the brain October 03, 2010.  MRI HEAD  Findings:  Multiple punctate foci of reduced diffusion within the left frontal parietal lobes, as well as posterior limb of the left internal capsule.  Corresponding low ADC values consistent with acute ischemia.  No definite associated FLAIR abnormality.  The ventricles and sulci are normal for patient's age.  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  No susceptibility artifact to suggest hemorrhage.  No abnormal intracranial intrinsic shortening; punctate foci of low gradient signal seen in the left thalamus, left parietal lobe most consistent with vascular flow voids.  No mass lesions, mass effect or midline shift.  No abnormal  extra-axial fluid collections.  Normal major intracranial vascular flow voids seen at the skull base. No definite extra-axial masses.  Mild paranasal sinus mucosal thickening without air fluid levels. Mastoid air cells appear well aerated.  Ocular globes and orbital contents are not suspicious but not tailored for evaluation.  No suspicious calvarial bone marrow signal.  Craniocervical junction maintained.  IMPRESSION: Multiple punctate foci of acute ischemia within the left middle cerebral artery territory (left frontoparietal lobes).  No hemorrhagic conversion.  Remote right pontine infarct.  MRA HEAD  Findings: Anterior circulation:  Normal flow related enhancement of the included cervical internal carotid arteries, the origin of the petrous segments.  There is diminished flow related enhancement of the distal left petrous internal carotid artery, with very diminished left cavernous carotid artery flow related enhancement.  Normal flow related enhancement left carotid terminus, with normal flow related enhancement the anterior and middle cerebral arteries including main branches/segments.  Patent appearing anterior communicating artery.  Posterior circulation:  Nearly codominant vertebral arteries.  The left vertebral artery courses to the right with widely patent branch vessels.  However, at least 50% narrowing of a 5 mm segment the origin of the basilar artery, with mild irregularity of the basilar artery diffusely and post stenotic dilatation, mildly advanced from prior examination.  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  No discrete left posterior communicating artery is identified. Mildly diminished flow related enhancement of the more distal right posterior cerebral artery segment.  No aneurysm.  IMPRESSION: Slow flow versus occlusion of the left carotid siphon with inferred retrograde filling of the left anterior circulation via a patent  anterior communicating artery.  Normal flow related enhancement of the left middle cerebral arteries and remaining anterior vessels. Findings may be better characterized on CT angiogram the head, as clinically indicated.  At least 50% narrowing at the origin of the basilar artery, with luminal irregularity, suggesting intracranial atherosclerosis, mildly advanced from prior examination.  Critical Value/emergent results were called by telephone at the time of interpretation on March 26, 2013 at 2220 hours to Dr. Noel Christmas, neurology, who verbally acknowledged these results.   Original Report Authenticated By: Awilda Metro    ASSESSMENT AND PLAN:  Patient is a 57 yo with known CAD Last intervention in Sept 2013. From a cardiac standpoint he has no symptoms to suggest active ischemia. He is active.  Patient does have signif CAD without symptoms.  He is at some increased risk for major cardiac event given his CV disease. Would continue medical Rx. Watch BP/HR closely in the periop period. Avoid hypotension. He is bradycardic today but asymptomatic. Will follow with you. Continue current medical regimen.  2D echo with normal LVF and mild MR      Quintella Reichert, MD  03/29/2013  10:50 AM

## 2013-03-29 NOTE — Progress Notes (Addendum)
[facility] Progress Note  03/29/2013 8:55 AM HD 3  Subjective:  No complaints.  No new events.  Afebrile HR 40's-50's regular 120's-130's systolic 100% RA  Filed Vitals:   03/29/13 0600  BP: 130/70  Pulse: 45  Temp: 98.2 F (36.8 C)  Resp: 18    CBC    Component Value Date/Time   WBC 5.4 03/28/2013 1615   RBC 4.84 03/28/2013 1615   HGB 13.8 03/28/2013 1615   HCT 39.4 03/28/2013 1615   PLT 230 03/28/2013 1615   MCV 81.4 03/28/2013 1615   MCH 28.5 03/28/2013 1615   MCHC 35.0 03/28/2013 1615   RDW 12.9 03/28/2013 1615   LYMPHSABS 2.2 03/26/2013 1613   MONOABS 0.2 03/26/2013 1613   EOSABS 0.1 03/26/2013 1613   BASOSABS 0.0 03/26/2013 1613    BMET    Component Value Date/Time   NA 137 03/26/2013 1613   K 3.3* 03/26/2013 1613   CL 101 03/26/2013 1613   CO2 24 03/26/2013 1613   GLUCOSE 89 03/26/2013 1613   BUN 16 03/26/2013 1613   CREATININE 0.96 03/26/2013 2058   CALCIUM 9.4 03/26/2013 1613   GFRNONAA >90 03/26/2013 2058   GFRAA >90 03/26/2013 2058    INR    Component Value Date/Time   INR 1.04 03/26/2013 1613    No intake or output data in the 24 hours ending 03/29/13 0855   Assessment/Plan:  57 y.o. male is in need of left CEA HD 3  -plan for left CEA tomorrow by Dr. Arbie Cookey -pt on heparin-Hgb stable.  Will d/c on call to OR tomorrow -continue ASA   Doreatha Massed, PA-C [facility] [phone] 03/29/2013 8:55 AM  Left CEA tomorrow by Dr Arbie Cookey  Fabienne Bruns, MD [facility] [place] Office: [phone] Pager: [phone]

## 2013-03-29 NOTE — Progress Notes (Signed)
Family Medicine Teaching Service Daily Progress Note Intern Pager: [phone]  Patient name: Shawn Hood Medical record number: [id number] Date of birth: 1956/02/02 Age: 57 y.o. Gender: male  Primary Care Provider: No primary provider on file. Consultants: Neurology, Cardiology, Vascular Surgery Code Status: Full  Pt Overview and Major Events to Date:  10/2: Admitted with TIA/CVA symptoms 10/3: Neuro work up including carotid doppler, echocardiogram  Assessment and Plan: Shawn Hood is a 57 y.o. male presenting with Likely TIA. PMH is significant for DMII, HLD, CAD s/p CABG, CVA (L sided deficits), HTN, former smoker.   # TIA: probable left subcortical MCA territory TIA, possibly small vessel ischemia. W/o deficits on exam at admission.  - Left CEA on Monday  - Holding plavix, continuing ASA, Will hold heparin on call to OR - NPO after midnight. - MRI/MRA: possible L carotid siphon occlusion, no sign of stroke.  - 10/3: 2D Echo LV EF 65-70%, LV normal size, mild regurg of MV,   - Lipids (on max lipitor) LDL 94 - Allow permissive HTN (BP too low for ACE as indicated by DM at this time)   # CV: h/o CAD s/p CABG and HTN. W/o CP. Allow HTN as above.  - Cont ASA  - Tele  - Echo as above.  - Cards recs: avoid hypotension, watch BP/HR closely. Continue current medical regimen for asymptomatic CAD  # DM: Last A1c 7.5 in March. Would like to see pt closer to 7 if CBG will tolerate. On Lantus 30 and Novolog 10 TID and metformin.  - A1c 9.3  - Mod. SSI, HS correction - Hold Metformin   # HLD: on lipitor 80 at home  - continue lipitor at max dose  - LDL 94, HDL 31, Trig 62  FEN/GI: Heart healthy diet, NPO after MN.  - IVF 37ml/hr - Senna/miralax PRN   Prophylaxis: Hep [place] TID   Disposition: To OR tomorrow for L CEA  Subjective: Pt sitting up in chair bedside his bed. He had no complaints and had no overnight events. He was aware of his surgery scheduled for Monday.    Objective: Temp:  [97.7 F (36.5 C)-98.2 F (36.8 C)] 97.8 F (36.6 C) (10/05 0937) Pulse Rate:  [45-52] 49 (10/05 0937) Resp:  [18-20] 18 (10/05 0937) BP: (122-137)/(67-73) 127/72 mmHg (10/05 0937) SpO2:  [99 %-100 %] 100 % (10/05 0937) Physical Exam: General: 57 yo man in NAD Cardiovascular: RRR no m/r/g,   Respiratory: CTAB, nonlabored  Abdomen: NABS, soft non-tender  Extremities: 2+ pulses, trace edema, well perfused  Skin: warm, without lesions  Laboratory:  Recent Labs Lab 03/26/13 1613 03/26/13 2058 03/28/13 1615  WBC 3.7* 4.6 5.4  HGB 13.1 12.5* 13.8  HCT 38.2* 35.5* 39.4  PLT 200 189 230    Recent Labs Lab 03/26/13 1613 03/26/13 2058  NA 137  --   K 3.3*  --   CL 101  --   CO2 24  --   BUN 16  --   CREATININE 0.86 0.96  CALCIUM 9.4  --   PROT 8.3  --   BILITOT 0.6  --   ALKPHOS 102  --   ALT 18  --   AST 20  --   GLUCOSE 89  --     Recent Labs Lab 03/28/13 1059 03/28/13 1614 03/28/13 2215 03/29/13 0651 03/29/13 1106  GLUCAP 161* 114* 132* 105* 237*     Imaging/Diagnostic Tests: Carotid Doppler See angio  Right - 1% to 39% ICA  stenosis. Vertebral artery flow is antegrade with elevated velocities suggestive of a more distal obstruction or stenosis.  Left - 80% to 99% ICA stenosis at the distal bulb /proximal ICA. Vertebral artery flow is antegrade.  2D Echo LV EF 65-70%, LV normal size, mild regurg of MV  CTA NECK IMPRESSION Very high-grade stenosis of the left ICA distal bulb,radiographic string-sign, related to bulky and irregular soft and  calcified plaque. The cervical left ICA remains patent. Left CCA stenoses of up to 60%  CTA HEAD IMPRESSION: Bilateral ICA siphon atherosclerotic plaque. Stenosis at the left ICA anterior genu measures less than 50% with respect to the supraclinoid ICA, but might be numerically underestimated. Right ICA siphon without significant stenosis, and both carotid termini are patent.  CT HEAD WITHOUT  CONTRAST IMPRESSION:  No acute intracranial abnormality. No definite acute cortical infarction  MRI HEAD IMPRESSION:  Multiple punctate foci of acute ischemia within the left middle  cerebral artery territory (left frontoparietal lobes). No  hemorrhagic conversion.  MRA HEAD IMPRESSION:  Slow flow versus occlusion of the left carotid siphon with inferred  retrograde filling of the left anterior circulation via a patent  anterior communicating artery. Normal flow related enhancement of  the left middle cerebral arteries and remaining anterior vessels.  Findings may be better characterized on CT angiogram the head, as  clinically indicated.  At least 50% narrowing at the origin of the basilar artery, with  luminal irregularity, suggesting intracranial atherosclerosis,  mildly advanced from prior examination.  CXR FINDINGS:  Prior CABG. Heart is normal size. Lungs are clear. No effusions. No  acute bony abnormality.  IMPRESSION:  No acute findings.   Hazeline Junker, MD 03/29/2013, 12:41 PM PGY-1, [facility] FPTS Intern pager: [phone], text pages welcome

## 2013-03-29 NOTE — Progress Notes (Signed)
ANTICOAGULATION CONSULT NOTE - Follow Up Consult  Pharmacy Consult for Heparin Indication: left carotid stenosis  No Known Allergies  Patient Measurements: Height: 5\' 11"  (180.3 cm) Weight: 209 lb (94.802 kg) IBW/kg (Calculated) : 75.3    Vital Signs: Temp: 98.4 F (36.9 C) (10/05 1835) Temp src: Oral (10/05 1835) BP: 123/74 mmHg (10/05 1835) Pulse Rate: 59 (10/05 1835)  Labs:  Recent Labs  03/26/13 2058 03/28/13 1615 03/28/13 2255 03/29/13 0830 03/29/13 1130 03/29/13 1902  HGB 12.5* 13.8  --   --   --   --   HCT 35.5* 39.4  --   --   --   --   PLT 189 230  --   --   --   --   HEPARINUNFRC  --   --  0.82* 1.82*  --  0.24*  CREATININE 0.96  --   --   --  0.95  --     Estimated Creatinine Clearance: 103.3 ml/min (by C-G formula based on Cr of 0.95).   Medical History: Past Medical History  Diagnosis Date  . Diabetes mellitus type 2, uncontrolled, with complications DX: Age 50  . Hypertension   . Coronary artery disease     Hera. s/p 3-vessel CABG 2002. b NSTEMI 09/2010 with patent grafts, no obvious culprit, for med rx.  c. DES to SVG-OM, severe diffuse disease per [facility].  c.  DES to SVG to OM  9/13  . Hyperlipidemia   . 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  . Sinus bradycardia   . Myocardial infarction 2002  . Shortness of breath   . Stroke 2006 ?    left arm & leg    Medications:  Scheduled:  . aspirin EC  325 mg Oral Daily  . atorvastatin  80 mg Oral QHS  . [START ON 03/30/2013]  ceFAZolin (ANCEF) IV  2 g Intravenous On Call  . insulin aspart  0-15 Units Subcutaneous TID WC  . insulin aspart  0-5 Units Subcutaneous QHS  . lisinopril  20 mg Oral Daily    Assessment: 57 yr old male presented to ED following transient right sided face numbness and weakness of the right upper extremity. PMH includes DM, HTN, CAD, MI and CVA. A carotid duplex showed left ICA 80-99% stenosis. Started on heparin until surgery on Mon.   Heparin levels this morning were supratherapeutic, and drip rate reduced to 700 units/hr.  Now level is slightly subtherapeutic on 700 units/hr.  No bleeding or complications noted per chart notes.  Goal of Therapy:  Will reduce heparin level goal to 0.3-0.5 for CVA. Monitor platelets by anticoagulation protocol: Yes   Plan:  1. Increase heparin infusion rate to 800 units/hr. 2. Will recheck heparin level with AM labs. 3. Continue daily heparin level and CBC. 4. F/U plans for anticoagulation after CEA tomorrow.  Tad Moore, BCPS  Clinical Pharmacist Pager [phone]  03/29/2013 8:35 PM

## 2013-03-29 NOTE — Progress Notes (Signed)
ANTICOAGULATION CONSULT NOTE - Follow Up Consult  Pharmacy Consult for Heparin Indication: left carotid stenosis  No Known Allergies  Patient Measurements: Height: 5\' 11"  (180.3 cm) Weight: 209 lb (94.802 kg) IBW/kg (Calculated) : 75.3    Vital Signs: Temp: 97.8 F (36.6 C) (10/04 2200) Temp src: Oral (10/04 1824) BP: 137/72 mmHg (10/04 2200) Pulse Rate: 49 (10/04 2200)  Labs:  Recent Labs  03/26/13 1613 03/26/13 2058 03/28/13 1615 03/28/13 2255  HGB 13.1 12.5* 13.8  --   HCT 38.2* 35.5* 39.4  --   PLT 200 189 230  --   APTT 27  --   --   --   LABPROT 13.4  --   --   --   INR 1.04  --   --   --   HEPARINUNFRC  --   --   --  0.82*  CREATININE 0.86 0.96  --   --   TROPONINI <0.30  --   --   --     Estimated Creatinine Clearance: 102.2 ml/min (by C-G formula based on Cr of 0.96).   Medical History: Past Medical History  Diagnosis Date  . Diabetes mellitus type 2, uncontrolled, with complications DX: Age 26  . Hypertension   . Coronary artery disease     Hera. s/p 3-vessel CABG 2002. b NSTEMI 09/2010 with patent grafts, no obvious culprit, for med rx.  c. DES to SVG-OM, severe diffuse disease per LHC.  c.  DES to SVG to OM  9/13  . Hyperlipidemia   . 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  . Sinus bradycardia   . Myocardial infarction 2002  . Shortness of breath   . Stroke 2006 ?    left arm & leg    Medications:  Scheduled:  . aspirin EC  325 mg Oral Daily  . atorvastatin  80 mg Oral QHS  . [START ON 03/30/2013]  ceFAZolin (ANCEF) IV  1 g Intravenous On Call  . insulin aspart  0-15 Units Subcutaneous TID WC  . insulin aspart  0-5 Units Subcutaneous QHS  . lisinopril  20 mg Oral Daily    Assessment:56 yr old male presented to ED following transient right sided face numbness and weakness of the right upper extremity. PMH includes DM, HTN, CAD, MI and CVA. A carotid duplex showed left ICA 80-99% stenosis. Pt will be put  on heparin until surgery on Mon.  Initial heparin level is > goal 0.82 units/ml.  Goal of Therapy:  Heparin level 0.3-0.7 units/ml Monitor platelets by anticoagulation protocol: Yes   Plan:  1 Decrease heparin drip to 1100 units/hr 2 Check heparin level in 8 hrs  Gabriella Woodhead Poteet 03/29/2013,12:15 AM

## 2013-03-29 NOTE — Progress Notes (Signed)
FMTS Attending Admission Note: Kehinde Eniola,MD I  have seen and examined this patient, reviewed their chart. I have discussed this patient with the resident. I agree with the resident's findings, assessment and care plan.  

## 2013-03-29 NOTE — Progress Notes (Signed)
ANTICOAGULATION CONSULT NOTE - Follow Up Consult  Pharmacy Consult for Heparin Indication: left carotid stenosis  No Known Allergies  Patient Measurements: Height: 5\' 11"  (180.3 cm) Weight: 209 lb (94.802 kg) IBW/kg (Calculated) : 75.3    Vital Signs: Temp: 97.8 F (36.6 C) (10/05 0937) Temp src: Oral (10/05 0937) BP: 127/72 mmHg (10/05 0937) Pulse Rate: 49 (10/05 0937)  Labs:  Recent Labs  03/26/13 1613 03/26/13 2058 03/28/13 1615 03/28/13 2255 03/29/13 0830  HGB 13.1 12.5* 13.8  --   --   HCT 38.2* 35.5* 39.4  --   --   PLT 200 189 230  --   --   APTT 27  --   --   --   --   LABPROT 13.4  --   --   --   --   INR 1.04  --   --   --   --   HEPARINUNFRC  --   --   --  0.82* 1.82*  CREATININE 0.86 0.96  --   --   --   TROPONINI <0.30  --   --   --   --     Estimated Creatinine Clearance: 102.2 ml/min (by C-G formula based on Cr of 0.96).   Medical History: Past Medical History  Diagnosis Date  . Diabetes mellitus type 2, uncontrolled, with complications DX: Age 89  . Hypertension   . Coronary artery disease     Hera. s/p 3-vessel CABG 2002. b NSTEMI 09/2010 with patent grafts, no obvious culprit, for med rx.  c. DES to SVG-OM, severe diffuse disease per [facility].  c.  DES to SVG to OM  9/13  . Hyperlipidemia   . 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  . Sinus bradycardia   . Myocardial infarction 2002  . Shortness of breath   . Stroke 2006 ?    left arm & leg    Medications:  Scheduled:  . aspirin EC  325 mg Oral Daily  . atorvastatin  80 mg Oral QHS  . [START ON 03/30/2013]  ceFAZolin (ANCEF) IV  1 g Intravenous On Call  . insulin aspart  0-15 Units Subcutaneous TID WC  . insulin aspart  0-5 Units Subcutaneous QHS  . lisinopril  20 mg Oral Daily    Assessment: 57 yr old male presented to ED following transient right sided face numbness and weakness of the right upper extremity. PMH includes DM, HTN, CAD, MI and CVA. A  carotid duplex showed left ICA 80-99% stenosis. Started on heparin until surgery on Mon.  Initial heparin level is > goal 0.82 units/ml.  Rate was reduced this AM, but heparin level continues to rise, and is now = 1.82.  CBC stable, no bleeding or complications noted per chart notes.  Confirmed with RN, heparin level was drawn from arm opposite where heparin is infusing.  Goal of Therapy:  Will reduce heparin level goal to 0.3-0.5 for CVA. Monitor platelets by anticoagulation protocol: Yes   Plan:  1.  Hold IV heparin x 1 hr. 2. Then resume IV heparin at lower rate of 700 units/hr. 3. Check heparin level 6 hrs after heparin resumed. 4. Continue daily heparin level and CBC. 5. F/U plans for anticoagulation after CEA tomorrow.  Tad Moore, BCPS  Clinical Pharmacist Pager [phone]  03/29/2013 11:53 AM

## 2013-03-29 NOTE — Progress Notes (Signed)
Stroke Team Progress Note  HISTORY Shawn Hood is a 57 y.o. male with a history of diabetes mellitus, hypertension, hyperlipidemia, previous stroke and coronary artery disease, presenting 03/26/2013 with history of transient numbness involving right upper extremity and right side of the face as well as weakness and coordination abnormality of right upper extremity. Initial symptoms occurred night of 03/25/2013 at about 9 PM and lasted about 15 minutes involving numbness of his right hand. It recurrent numbness at 9 AM 03/26/2013 involving right upper extremity and right side of his face. He also noticed difficulty lifting his right arm as well as coordination difficulty of his right upper extremity. Symptoms again lasted about 15 minutes then resolved completely. CT scan of his head showed no acute intracranial abnormality. Patient has been taking aspirin and Plavix daily. NIH stroke score on admission was 0. He was last known well at 9 AM 03/26/2013.   LSN: 9 AM on 03/26/2013  tPA Given: No: Deficits resolved  MRankin: 0  Patient was not a TPA candidate secondary to resolution of deficits. He was admitted for further evaluation and treatment.  SUBJECTIVE No family members present this morning. The patient is without complaints. He is awaiting surgery tomorrow.  OBJECTIVE Most recent Vital Signs: Filed Vitals:   03/28/13 1824 03/28/13 2200 03/29/13 0200 03/29/13 0600  BP: 122/69 137/72 128/67 130/70  Pulse: 52 49 50 45  Temp: 98.1 F (36.7 C) 97.8 F (36.6 C) 97.7 F (36.5 C) 98.2 F (36.8 C)  TempSrc: Oral     Resp: 20 18 18 18   Height:      Weight:      SpO2: 99% 100% 99% 100%   CBG (last 3)   Recent Labs  03/28/13 1614 03/28/13 2215 03/29/13 0651  GLUCAP 114* 132* 105*    IV Fluid Intake:   . sodium chloride 1,000 mL (03/26/13 2029)  . heparin 1,100 Units/hr (03/29/13 1610)    MEDICATIONS  . aspirin EC  325 mg Oral Daily  . atorvastatin  80 mg Oral QHS  . [START ON  03/30/2013]  ceFAZolin (ANCEF) IV  1 g Intravenous On Call  . insulin aspart  0-15 Units Subcutaneous TID WC  . insulin aspart  0-5 Units Subcutaneous QHS  . lisinopril  20 mg Oral Daily   PRN:  senna-docusate  Diet:  Cardiac thin liquids Activity:  OOB with assist DVT Prophylaxis:  Heparin SQ  CLINICALLY SIGNIFICANT STUDIES Basic Metabolic Panel:   Recent Labs Lab 03/26/13 1613 03/26/13 2058  NA 137  --   K 3.3*  --   CL 101  --   CO2 24  --   GLUCOSE 89  --   BUN 16  --   CREATININE 0.86 0.96  CALCIUM 9.4  --    Liver Function Tests:   Recent Labs Lab 03/26/13 1613  AST 20  ALT 18  ALKPHOS 102  BILITOT 0.6  PROT 8.3  ALBUMIN 4.0   CBC:   Recent Labs Lab 03/26/13 1613 03/26/13 2058 03/28/13 1615  WBC 3.7* 4.6 5.4  NEUTROABS 1.1*  --   --   HGB 13.1 12.5* 13.8  HCT 38.2* 35.5* 39.4  MCV 80.8 81.4 81.4  PLT 200 189 230   Coagulation:   Recent Labs Lab 03/26/13 1613  LABPROT 13.4  INR 1.04   Cardiac Enzymes:   Recent Labs Lab 03/26/13 1613  TROPONINI <0.30   Urinalysis: No results found for this basename: COLORURINE, APPERANCEUR, LABSPEC, PHURINE, GLUCOSEU, HGBUR, BILIRUBINUR,  Shawn Hood, UROBILINOGEN, NITRITE, LEUKOCYTESUR,  in the last 168 hours Lipid Panel    Component Value Date/Time   CHOL 137 03/27/2013 0607   TRIG 62 03/27/2013 0607   HDL 31* 03/27/2013 0607   CHOLHDL 4.4 03/27/2013 0607   VLDL 12 03/27/2013 0607   LDLCALC 94 03/27/2013 0607   HgbA1C  Lab Results  Component Value Date   HGBA1C 9.3* 03/27/2013    Urine Drug Screen:   No results found for this basename: labopia,  cocainscrnur,  labbenz,  amphetmu,  thcu,  labbarb    Alcohol Level: No results found for this basename: ETH,  in the last 168 hours  Ct Angio Head W/cm &/or Wo Cm 03/27/2013     1. Bilateral ICA siphon atherosclerotic plaque. Stenosis at the left ICA anterior genu measures less than 50% with respect to the supra clinoid ICA, but might be  numerically underestimated. Right ICA siphon without significant stenosis, and both carotid termini are patent.  2. Mild to moderate irregularity of the left MCA M1 segment, but patent bifurcation and no left MCA branch occlusion identified.  3. Advanced posterior circulation atherosclerosis. Mild to moderate distal right vertebral artery stenosis. 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  4. Stable and negative CT appearance of the brain ; small diffusion positive lesions in the left hemisphere seen yesterday are not yet evident.   Dg Chest 2 View 03/26/2013 No acute findings.    Ct Head Wo Contrast 03/26/2013   No acute intracranial abnormality. No definite acute cortical infarction.    Ct Angio Neck W/cm &/or Wo/cm 03/27/2013    1. Very high-grade stenosis of the left ICA distal bulb, radiographic string-sign, related to bulky and irregular soft and calcified plaque. The cervical left ICA remains patent.  2. 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  3. Diminutive vertebral arteries with bilateral proximal vertebral stenosis (moderate).  4. No significant right cervical carotid stenosis.    Mr Brain Wo Contrast 03/26/2013    Multiple punctate foci of acute ischemia within the left middle cerebral artery territory (left frontoparietal lobes).  No hemorrhagic conversion.  Remote right pontine infarct.   Mr [name]/brain Wo Cm 03/26/2013    Slow flow versus occlusion of the left carotid siphon with inferred retrograde filling of the left anterior circulation via a patent anterior communicating artery.  Normal flow related enhancement of the left middle cerebral arteries and remaining anterior vessels. Findings may be better characterized on CT angiogram the head, as clinically indicated.  At least 50% narrowing at the origin of the basilar artery, with luminal irregularity, suggesting intracranial atherosclerosis, mildly  advanced from prior examination.   2D Echocardiogram  EF 65%, no wall motion abnormalities, LA mildly dilated.  Carotid Doppler  See angio Right - 1% to 39% ICA stenosis. Vertebral artery flow is antegrade with elevated velocities suggestive of a more distal obstruction or stenosis.  Left - 80% to 99% ICA stenosis at the distal bulb /proximal ICA. Vertebral artery flow is antegrade.  CXR    EKG  normal sinus rhythm.   Therapy Recommendations - no followup therapy is recommended  Physical Exam   Neurologic Examination:  Mental Status:  Alert, oriented, thought content appropriate. Speech fluent without evidence of aphasia. Able to follow commands without difficulty.  Cranial Nerves:  II-Visual fields were normal.  III/IV/VI-Pupils were equal and reacted. Extraocular movements were full and  conjugate.  V/VII-no facial numbness and no facial weakness.  VIII-normal.  X-normal speech and symmetrical palatal movement.  Motor:  Strength is 5/5 throughout. Sensory: Reduced perception of touch as well as vibration below the knees bilaterally.  Deep Tendon Reflexes: Asymmetric with slightly greater responses elicited from left extremities compared to right extremities; right ankle jerk was absent.  Plantars: Extensor on the left and flexor on the right  Cerebellar: Normal finger-to-nose testing bilaterally.  Carotid auscultation: Normal   ASSESSMENT Mr. Shawn Hood is a 57 y.o. male presenting with transient sensory loss and hemiparesis of right upper extremity and face for 15 minutes on several occasions, symptoms resolved. Imaging confirms a multiple punctate foci of acute ischemia within the left middle cerebral artery territory, frontoparietal lobes. Infarct felt to be thrombotic secondary to severe left carotid artery stenosis.  On aspirin 325 mg orally every day and clopidogrel 75 mg orally every day prior to admission. Now on aspirin 325 mg orally every day for secondary stroke  prevention. Patient with resultant resolved symptoms. Work up underway.   Left carotid stenosis, symptomatic  Diabetes mellitus, type 2, uncontrolled, HGB A1C  9.3  Dyslipidemia, LDL 94, goal < 70, on statin  Hypertension  Coronary artery disease   Previous stroke   Hospital day # 3  TREATMENT/PLAN  Continue aspirin 325 mg orally every day for secondary stroke prevention.  Cardiology has cleared patient for surgery.  Per Dr. Darrick Penna - Left carotid endarterectomy on Monday.   Delton See PA-C [facility] Pager [phone] 03/29/2013, 7:57 AM  I have personally obtained a history, examined the patient, evaluated imaging results, and formulated the assessment and plan of care. I agree with the above.  NPO after midnight for left carotid endarterecomy tomorrow AM.  Pauletta Browns

## 2013-03-30 ENCOUNTER — Encounter (HOSPITAL_COMMUNITY): Payer: Self-pay | Admitting: Certified Registered"

## 2013-03-30 ENCOUNTER — Inpatient Hospital Stay (HOSPITAL_COMMUNITY): Payer: Medicare Other | Admitting: Certified Registered"

## 2013-03-30 ENCOUNTER — Encounter (HOSPITAL_COMMUNITY)
Admission: EM | Disposition: A | Discharge status: Home / Self Care | Payer: Self-pay | Source: Home / Self Care | Attending: Family Medicine

## 2013-03-30 ENCOUNTER — Telehealth: Payer: Self-pay | Admitting: Vascular Surgery

## 2013-03-30 DIAGNOSIS — G459 Transient cerebral ischemic attack, unspecified: Secondary | ICD-10-CM

## 2013-03-30 HISTORY — PX: ENDARTERECTOMY: SHX5162

## 2013-03-30 LAB — SURGICAL PCR SCREEN: Staphylococcus aureus: NEGATIVE

## 2013-03-30 LAB — CBC
Hemoglobin: 12.8 g/dL — ABNORMAL LOW (ref 13.0–17.0)
MCH: 28.8 pg (ref 26.0–34.0)
MCHC: 35.4 g/dL (ref 30.0–36.0)
Platelets: 226 10*3/uL (ref 150–400)
RDW: 12.8 % (ref 11.5–15.5)

## 2013-03-30 LAB — GLUCOSE, CAPILLARY
Glucose-Capillary: 132 mg/dL — ABNORMAL HIGH (ref 70–99)
Glucose-Capillary: 104 mg/dL — ABNORMAL HIGH (ref 70–99)
Glucose-Capillary: 139 mg/dL — ABNORMAL HIGH (ref 70–99)
Glucose-Capillary: 154 mg/dL — ABNORMAL HIGH (ref 70–99)
Glucose-Capillary: 181 mg/dL — ABNORMAL HIGH (ref 70–99)

## 2013-03-30 LAB — BASIC METABOLIC PANEL
Calcium: 9 mg/dL (ref 8.4–10.5)
Creatinine, Ser: 0.99 mg/dL (ref 0.50–1.35)
GFR calc Af Amer: >90 mL/min (ref >90)
GFR calc non Af Amer: >90 mL/min (ref >90)
Glucose, Bld: 109 mg/dL — ABNORMAL HIGH (ref 70–99)
Potassium: 3.8 mEq/L (ref 3.5–5.1)
Sodium: 140 mEq/L (ref 135–145)

## 2013-03-30 LAB — PROTIME-INR: Prothrombin Time: 13.4 seconds (ref 11.6–15.2)

## 2013-03-30 LAB — APTT: aPTT: 71 seconds — ABNORMAL HIGH (ref 24–37)

## 2013-03-30 SURGERY — ENDARTERECTOMY, CAROTID
Anesthesia: General | Site: Neck | Laterality: Left | Wound class: Clean

## 2013-03-30 MED ORDER — 0.9 % SODIUM CHLORIDE (POUR BTL) OPTIME
TOPICAL | Status: DC | PRN
  Administered 2013-03-30: 2000 mL

## 2013-03-30 MED ORDER — HYDRALAZINE HCL 20 MG/ML IJ SOLN: 10.0000 mg | INTRAMUSCULAR | Status: DC | PRN

## 2013-03-30 MED ORDER — VECURONIUM BROMIDE 10 MG IV SOLR
INTRAVENOUS | Status: DC | PRN
  Administered 2013-03-30: 2 mg via INTRAVENOUS

## 2013-03-30 MED ORDER — OXYCODONE HCL 5 MG/5ML PO SOLN: 5.0000 mg | Freq: Once | ORAL | Status: DC | PRN

## 2013-03-30 MED ORDER — PHENOL 1.4 % MT LIQD
1.0000 | OROMUCOSAL | Status: DC | PRN
  Administered 2013-03-31: 1 via OROMUCOSAL
  Filled 2013-03-30: qty 177

## 2013-03-30 MED ORDER — ONDANSETRON HCL 4 MG/2ML IJ SOLN
INTRAMUSCULAR | Status: DC | PRN
  Administered 2013-03-30: 4 mg via INTRAMUSCULAR

## 2013-03-30 MED ORDER — HEPARIN SODIUM (PORCINE) 1000 UNIT/ML IJ SOLN
INTRAMUSCULAR | Status: DC | PRN
  Administered 2013-03-30: 9000 [IU] via INTRAVENOUS

## 2013-03-30 MED ORDER — HYDROMORPHONE HCL PF 1 MG/ML IJ SOLN
INTRAMUSCULAR | Status: AC
  Administered 2013-03-30: 0.5 mg via INTRAVENOUS
  Filled 2013-03-30: qty 1

## 2013-03-30 MED ORDER — FENTANYL CITRATE 0.05 MG/ML IJ SOLN
INTRAMUSCULAR | Status: DC | PRN
  Administered 2013-03-30: 100 ug via INTRAVENOUS

## 2013-03-30 MED ORDER — GLYCOPYRROLATE 0.2 MG/ML IJ SOLN
INTRAMUSCULAR | Status: DC | PRN
  Administered 2013-03-30: 0.4 mg via INTRAVENOUS

## 2013-03-30 MED ORDER — PROPOFOL 10 MG/ML IV BOLUS
INTRAVENOUS | Status: DC | PRN
  Administered 2013-03-30: 200 mg via INTRAVENOUS

## 2013-03-30 MED ORDER — HYDROMORPHONE HCL PF 1 MG/ML IJ SOLN
INTRAMUSCULAR | Status: AC
  Filled 2013-03-30: qty 1

## 2013-03-30 MED ORDER — POTASSIUM CHLORIDE CRYS ER 20 MEQ PO TBCR: 20.0000 meq | EXTENDED_RELEASE_TABLET | Freq: Once | ORAL | Status: AC | PRN

## 2013-03-30 MED ORDER — OXYCODONE HCL 5 MG PO TABS: 5.0000 mg | ORAL_TABLET | Freq: Once | ORAL | Status: DC | PRN

## 2013-03-30 MED ORDER — SODIUM CHLORIDE 0.9 % IV SOLN: 500.0000 mL | Freq: Once | INTRAVENOUS | Status: AC | PRN

## 2013-03-30 MED ORDER — HYDROMORPHONE HCL PF 1 MG/ML IJ SOLN
0.2500 mg | INTRAMUSCULAR | Status: DC | PRN
  Administered 2013-03-30 (×4): 0.5 mg via INTRAVENOUS

## 2013-03-30 MED ORDER — SODIUM CHLORIDE 0.9 % IR SOLN
Status: DC | PRN
  Administered 2013-03-30: 10:00:00

## 2013-03-30 MED ORDER — LIDOCAINE HCL (PF) 1 % IJ SOLN
INTRAMUSCULAR | Status: AC
  Filled 2013-03-30: qty 30

## 2013-03-30 MED ORDER — METOPROLOL TARTRATE 1 MG/ML IV SOLN: 2.0000 mg | INTRAVENOUS | Status: DC | PRN

## 2013-03-30 MED ORDER — ASPIRIN EC 325 MG PO TBEC
325.0000 mg | DELAYED_RELEASE_TABLET | Freq: Every day | ORAL | Status: DC
  Administered 2013-03-31: 325 mg via ORAL
  Filled 2013-03-30: qty 1

## 2013-03-30 MED ORDER — DOCUSATE SODIUM 100 MG PO CAPS
100.0000 mg | ORAL_CAPSULE | Freq: Every day | ORAL | Status: DC
  Administered 2013-03-31: 100 mg via ORAL
  Filled 2013-03-30: qty 1

## 2013-03-30 MED ORDER — MORPHINE SULFATE 2 MG/ML IJ SOLN
2.0000 mg | INTRAMUSCULAR | Status: DC | PRN
  Administered 2013-03-30: 2 mg via INTRAVENOUS
  Filled 2013-03-30: qty 2

## 2013-03-30 MED ORDER — ACETAMINOPHEN 325 MG PO TABS
325.0000 mg | ORAL_TABLET | ORAL | Status: DC | PRN
  Administered 2013-03-30: 650 mg via ORAL
  Filled 2013-03-30: qty 2

## 2013-03-30 MED ORDER — NEOSTIGMINE METHYLSULFATE 1 MG/ML IJ SOLN
INTRAMUSCULAR | Status: DC | PRN
  Administered 2013-03-30: 3 mg via INTRAVENOUS

## 2013-03-30 MED ORDER — ONDANSETRON HCL 4 MG/2ML IJ SOLN
4.0000 mg | Freq: Four times a day (QID) | INTRAMUSCULAR | Status: DC | PRN
  Administered 2013-03-30: 4 mg via INTRAVENOUS
  Filled 2013-03-30: qty 2

## 2013-03-30 MED ORDER — DEXTROSE 5 % IV SOLN
1.5000 g | Freq: Two times a day (BID) | INTRAVENOUS | Status: AC
  Administered 2013-03-30 – 2013-03-31 (×2): 1.5 g via INTRAVENOUS
  Filled 2013-03-30 (×3): qty 1.5

## 2013-03-30 MED ORDER — PHENYLEPHRINE HCL 10 MG/ML IJ SOLN
10.0000 mg | INTRAVENOUS | Status: DC | PRN
  Administered 2013-03-30: 20 ug/min via INTRAVENOUS

## 2013-03-30 MED ORDER — DOPAMINE-DEXTROSE 3.2-5 MG/ML-% IV SOLN: 3.0000 ug/kg/min | INTRAVENOUS | Status: DC

## 2013-03-30 MED ORDER — LACTATED RINGERS IV SOLN
INTRAVENOUS | Status: DC | PRN
  Administered 2013-03-30 (×2): via INTRAVENOUS

## 2013-03-30 MED ORDER — ALUM & MAG HYDROXIDE-SIMETH 200-200-20 MG/5ML PO SUSP: 15.0000 mL | ORAL | Status: DC | PRN

## 2013-03-30 MED ORDER — LABETALOL HCL 5 MG/ML IV SOLN: 10.0000 mg | INTRAVENOUS | Status: DC | PRN

## 2013-03-30 MED ORDER — ONDANSETRON HCL 4 MG/2ML IJ SOLN: 4.0000 mg | Freq: Four times a day (QID) | INTRAMUSCULAR | Status: DC | PRN

## 2013-03-30 MED ORDER — ACETAMINOPHEN 650 MG RE SUPP: 325.0000 mg | RECTAL | Status: DC | PRN

## 2013-03-30 MED ORDER — PROTAMINE SULFATE 10 MG/ML IV SOLN
INTRAVENOUS | Status: DC | PRN
  Administered 2013-03-30: 15 mg via INTRAVENOUS
  Administered 2013-03-30: 20 mg via INTRAVENOUS
  Administered 2013-03-30: 15 mg via INTRAVENOUS

## 2013-03-30 MED ORDER — OXYCODONE HCL 5 MG PO TABS
5.0000 mg | ORAL_TABLET | ORAL | Status: DC | PRN
  Administered 2013-03-31: 5 mg via ORAL
  Filled 2013-03-30: qty 1

## 2013-03-30 MED ORDER — ROCURONIUM BROMIDE 100 MG/10ML IV SOLN
INTRAVENOUS | Status: DC | PRN
  Administered 2013-03-30: 50 mg via INTRAVENOUS

## 2013-03-30 MED ORDER — GUAIFENESIN-DM 100-10 MG/5ML PO SYRP: 15.0000 mL | ORAL_SOLUTION | ORAL | Status: DC | PRN

## 2013-03-30 MED ORDER — LIDOCAINE HCL (CARDIAC) 20 MG/ML IV SOLN
INTRAVENOUS | Status: DC | PRN
  Administered 2013-03-30: 70 mg via INTRAVENOUS

## 2013-03-30 MED ORDER — HYDRALAZINE HCL 20 MG/ML IJ SOLN
INTRAMUSCULAR | Status: DC | PRN
  Administered 2013-03-30 (×2): 10 mg via INTRAVENOUS

## 2013-03-30 MED ORDER — PANTOPRAZOLE SODIUM 40 MG PO TBEC
40.0000 mg | DELAYED_RELEASE_TABLET | Freq: Every day | ORAL | Status: DC
  Administered 2013-03-31: 40 mg via ORAL
  Filled 2013-03-30: qty 1

## 2013-03-30 MED ORDER — ENOXAPARIN SODIUM 40 MG/0.4ML ~~LOC~~ SOLN
40.0000 mg | SUBCUTANEOUS | Status: DC
  Administered 2013-03-31: 40 mg via SUBCUTANEOUS
  Filled 2013-03-30: qty 0.4

## 2013-03-30 SURGICAL SUPPLY — 52 items
ATTRACTOMAT 16X20 MAGNETIC DRP (DRAPES) ×2 IMPLANT
BENZOIN TINCTURE PRP APPL 2/3 (GAUZE/BANDAGES/DRESSINGS) ×2 IMPLANT
CANISTER SUCTION 2500CC (MISCELLANEOUS) ×2 IMPLANT
CATH ROBINSON RED A/P 18FR (CATHETERS) ×2 IMPLANT
CLIP LIGATING EXTRA MED SLVR (CLIP) ×2 IMPLANT
CLIP LIGATING EXTRA SM BLUE (MISCELLANEOUS) ×2 IMPLANT
COVER SURGICAL LIGHT HANDLE (MISCELLANEOUS) ×2 IMPLANT
CRADLE DONUT ADULT HEAD (MISCELLANEOUS) ×2 IMPLANT
DECANTER SPIKE VIAL GLASS SM (MISCELLANEOUS) IMPLANT
DRAIN HEMOVAC 1/8 X 5 (WOUND CARE) IMPLANT
DRAPE INCISE IOBAN 66X45 STRL (DRAPES) ×2 IMPLANT
DRAPE ORTHO SPLIT 77X108 STRL (DRAPES) ×1
DRAPE SURG ORHT 6 SPLT 77X108 (DRAPES) ×1 IMPLANT
DRAPE WARM FLUID 44X44 (DRAPE) ×2 IMPLANT
ELECT REM PT RETURN 9FT ADLT (ELECTROSURGICAL) ×2
ELECTRODE REM PT RTRN 9FT ADLT (ELECTROSURGICAL) ×1 IMPLANT
EVACUATOR SILICONE 100CC (DRAIN) IMPLANT
GEL ULTRASOUND 20GR AQUASONIC (MISCELLANEOUS) IMPLANT
GLOVE BIO SURGEON STRL SZ 6.5 (GLOVE) ×4 IMPLANT
GLOVE BIOGEL PI IND STRL 7.0 (GLOVE) ×2 IMPLANT
GLOVE BIOGEL PI INDICATOR 7.0 (GLOVE) ×2
GLOVE ECLIPSE 6.5 STRL STRAW (GLOVE) ×2 IMPLANT
GLOVE ECLIPSE 7.0 STRL STRAW (GLOVE) ×2 IMPLANT
GLOVE SS BIOGEL STRL SZ 7.5 (GLOVE) ×1 IMPLANT
GLOVE SUPERSENSE BIOGEL SZ 7.5 (GLOVE) ×1
GOWN STRL NON-REIN LRG LVL3 (GOWN DISPOSABLE) ×8 IMPLANT
KIT BASIN OR (CUSTOM PROCEDURE TRAY) ×2 IMPLANT
KIT ROOM TURNOVER OR (KITS) ×2 IMPLANT
LOOP VESSEL MAXI BLUE (MISCELLANEOUS) ×4 IMPLANT
LOOP VESSEL MINI RED (MISCELLANEOUS) ×2 IMPLANT
NEEDLE 22X1 1/2 (OR ONLY) (NEEDLE) IMPLANT
NS IRRIG 1000ML POUR BTL (IV SOLUTION) ×4 IMPLANT
PACK CAROTID (CUSTOM PROCEDURE TRAY) IMPLANT
PACK CV ACCESS (CUSTOM PROCEDURE TRAY) ×2 IMPLANT
PAD ARMBOARD 7.5X6 YLW CONV (MISCELLANEOUS) ×4 IMPLANT
PATCH HEMASHIELD 8X75 (Vascular Products) ×2 IMPLANT
SHUNT CAROTID BYPASS 10 (VASCULAR PRODUCTS) ×2 IMPLANT
SHUNT CAROTID BYPASS 12FRX15.5 (VASCULAR PRODUCTS) IMPLANT
SPONGE LAP 18X18 X RAY DECT (DISPOSABLE) ×2 IMPLANT
STRIP CLOSURE SKIN 1/2X4 (GAUZE/BANDAGES/DRESSINGS) ×2 IMPLANT
SUT ETHILON 3 0 PS 1 (SUTURE) IMPLANT
SUT PROLENE 6 0 CC (SUTURE) ×4 IMPLANT
SUT SILK 3 0 (SUTURE)
SUT SILK 3-0 18XBRD TIE 12 (SUTURE) IMPLANT
SUT VIC AB 3-0 SH 27 (SUTURE) ×2
SUT VIC AB 3-0 SH 27X BRD (SUTURE) ×2 IMPLANT
SUT VICRYL 4-0 PS2 18IN ABS (SUTURE) IMPLANT
SYR CONTROL 10ML LL (SYRINGE) IMPLANT
TAPE UMBILICAL COTTON 1/8X30 (MISCELLANEOUS) ×2 IMPLANT
TOWEL OR 17X24 6PK STRL BLUE (TOWEL DISPOSABLE) ×2 IMPLANT
TOWEL OR 17X26 10 PK STRL BLUE (TOWEL DISPOSABLE) ×2 IMPLANT
WATER STERILE IRR 1000ML POUR (IV SOLUTION) ×2 IMPLANT

## 2013-03-30 NOTE — Anesthesia Postprocedure Evaluation (Signed)
Anesthesia Post Note  Patient: Shawn Hood  Procedure(s) Performed: Procedure(s) (LRB): ENDARTERECTOMY CAROTID (Left)  Anesthesia type: General  Patient location: PACU  Post pain: Pain level controlled and Adequate analgesia  Post assessment: Post-op Vital signs reviewed, Patient's Cardiovascular Status Stable, Respiratory Function Stable, Patent Airway and Pain level controlled  Last Vitals:  Filed Vitals:   03/30/13 1204  BP: 155/59  Pulse: 66  Temp:   Resp: 29    Post vital signs: Reviewed and stable  Level of consciousness: awake, alert  and oriented  Complications: No apparent anesthesia complications

## 2013-03-30 NOTE — Progress Notes (Signed)
Patient arrived from PACU, BP 128/52, HR 56, 97.8,  CBG 154. Patient alert and oriented. Wife at bedside. Will continue to monitor.

## 2013-03-30 NOTE — Progress Notes (Signed)
Family Medicine Teaching Service Daily Progress Note Intern Pager: [phone]  Patient name: Shawn Hood Medical record number: [id number] Date of birth: July 25, 1955 Age: 57 y.o. Gender: male  Primary Care Provider: No primary provider on file. Consultants: Neurology, Cardiology, Vascular Surgery Code Status: Full  Pt Overview and Major Events to Date:  10/2: Admitted with TIA/CVA symptoms 10/3: Neuro work up finds high grade L ICA stenosis 10/6: To OR for L CEA  Assessment and Plan: Izael Bessinger is a 57 y.o. male presenting with Likely TIA. PMH is significant for DMII, HLD, CAD s/p CABG, CVA (L sided deficits), HTN, former smoker.   # TIA: probable left subcortical MCA territory TIA, possibly small vessel ischemia. W/o deficits on exam at admission. High grade symptomatic left carotid stenosis found on doppler and CTA. - Left CEA today per Dr. Darrick Penna - Holding plavix, continuing ASA, Will hold heparin on call to OR - NPO - MRI/MRA: possible L carotid siphon occlusion, no sign of stroke.  - 10/3: 2D Echo LV EF 65-70%, LV normal size, mild regurg of MV,   - Lipids (on max lipitor) LDL 94 - Allow permissive HTN (BP too low for ACE as indicated by DM at this time)   # CV: h/o CAD s/p CABG and HTN. W/o CP. Allow HTN as above.  - Cont ASA  - Tele  - Echo as above.  - Cards recs: avoid hypotension, watch BP/HR closely. Continue current medical regimen for asymptomatic CAD  # DM: Last A1c 7.5 in March. Would like to see pt closer to 7 if CBG will tolerate. On Lantus 30 and Novolog 10 TID and metformin.  - A1c 9.3  - Mod. SSI, HS correction - Hold Metformin   # HLD: on lipitor 80 at home  - continue lipitor at max dose  - LDL 94, HDL 31, Trig 62  FEN/GI: Heart healthy diet, NPO after MN.  - IVF 79ml/hr - Senna/miralax PRN   Prophylaxis: Hep Hawkins TID   Disposition: To OR for L CEA  Subjective: Pt sitting up in chair bedside his bed. He had no complaints and had no overnight events.  He was aware of his surgery scheduled for Monday.   Objective: Temp:  [97.6 F (36.4 C)-98.4 F (36.9 C)] 97.6 F (36.4 C) (10/06 0600) Pulse Rate:  [46-73] 52 (10/06 0600) Resp:  [18-20] 18 (10/06 0600) BP: (121-139)/(65-81) 133/81 mmHg (10/06 0600) SpO2:  [98 %-100 %] 98 % (10/06 0600) Physical Exam: General: 57 yo man in NAD Cardiovascular: RRR no m/r/g,   Respiratory: CTAB, nonlabored  Abdomen: NABS, soft non-tender  Extremities: 2+ pulses, trace edema, well perfused  Skin: warm, without lesions  Laboratory:  Recent Labs Lab 03/26/13 2058 03/28/13 1615 03/30/13 0505  WBC 4.6 5.4 5.5  HGB 12.5* 13.8 12.8*  HCT 35.5* 39.4 36.2*  PLT 189 230 226    Recent Labs Lab 03/26/13 1613 03/26/13 2058 03/29/13 1130 03/30/13 0505  NA 137  --  138 140  K 3.3*  --  3.8 3.8  CL 101  --  101 103  CO2 24  --  29 27  BUN 16  --  14 15  CREATININE 0.86 0.96 0.95 0.99  CALCIUM 9.4  --  9.6 9.0  PROT 8.3  --   --   --   BILITOT 0.6  --   --   --   ALKPHOS 102  --   --   --   ALT 18  --   --   --  AST 20  --   --   --   GLUCOSE 89  --  193* 109*    Recent Labs Lab 03/29/13 0651 03/29/13 1106 03/29/13 1611 03/29/13 2222 03/30/13 0658  GLUCAP 105* 237* 166* 132* 104*     Imaging/Diagnostic Tests: Carotid Doppler See angio  Right - 1% to 39% ICA stenosis. Vertebral artery flow is antegrade with elevated velocities suggestive of a more distal obstruction or stenosis.  Left - 80% to 99% ICA stenosis at the distal bulb /proximal ICA. Vertebral artery flow is antegrade.  2D Echo LV EF 65-70%, LV normal size, mild regurg of MV  CTA NECK IMPRESSION Very high-grade stenosis of the left ICA distal bulb,radiographic string-sign, related to bulky and irregular soft and  calcified plaque. The cervical left ICA remains patent. Left CCA stenoses of up to 60%  CTA HEAD IMPRESSION: Bilateral ICA siphon atherosclerotic plaque. Stenosis at the left ICA anterior genu measures less  than 50% with respect to the supraclinoid ICA, but might be numerically underestimated. Right ICA siphon without significant stenosis, and both carotid termini are patent.  CT HEAD WITHOUT CONTRAST IMPRESSION:  No acute intracranial abnormality. No definite acute cortical infarction  MRI HEAD IMPRESSION:  Multiple punctate foci of acute ischemia within the left middle  cerebral artery territory (left frontoparietal lobes). No  hemorrhagic conversion.  MRA HEAD IMPRESSION:  Slow flow versus occlusion of the left carotid siphon with inferred  retrograde filling of the left anterior circulation via a patent  anterior communicating artery. Normal flow related enhancement of  the left middle cerebral arteries and remaining anterior vessels.  Findings may be better characterized on CT angiogram the head, as  clinically indicated.  At least 50% narrowing at the origin of the basilar artery, with  luminal irregularity, suggesting intracranial atherosclerosis,  mildly advanced from prior examination.  CXR FINDINGS:  Prior CABG. Heart is normal size. Lungs are clear. No effusions. No  acute bony abnormality.  IMPRESSION:  No acute findings.   Hazeline Junker, MD 03/30/2013, 8:27 AM PGY-1, [facility] Family Medicine FPTS Intern pager: [phone], text pages welcome

## 2013-03-30 NOTE — Progress Notes (Signed)
Stroke Team Progress Note  HISTORY Shawn Hood is a 57 y.o. male with a history of diabetes mellitus, hypertension, hyperlipidemia, previous stroke and coronary artery disease, presenting 03/26/2013 with history of transient numbness involving right upper extremity and right side of the face as well as weakness and coordination abnormality of right upper extremity. Initial symptoms occurred night of 03/25/2013 at about 9 PM and lasted about 15 minutes involving numbness of his right hand. It recurrent numbness at 9 AM 03/26/2013 involving right upper extremity and right side of his face. He also noticed difficulty lifting his right arm as well as coordination difficulty of his right upper extremity. Symptoms again lasted about 15 minutes then resolved completely. CT scan of his head showed no acute intracranial abnormality. Patient has been taking aspirin and Plavix daily. NIH stroke score on admission was 0. He was last known well at 9 AM 03/26/2013.   LSN: 9 AM on 03/26/2013  tPA Given: No: Deficits resolved  MRankin: 0  Patient was not a TPA candidate secondary to resolution of deficits. He was admitted for further evaluation and treatment.  SUBJECTIVE Family waiting. Patient just in PACU. Awake and oriented. Tolerated procedure well. Found to have > 90% stenosis left carotid-symptomatic.  OBJECTIVE Most recent Vital Signs: Filed Vitals:   03/30/13 1333 03/30/13 1348 03/30/13 1404 03/30/13 1418  BP: 122/49 113/44 106/46 120/41  Pulse: 78 56 66 58  Temp:      TempSrc:      Resp: 24 21 23 18   Height:      Weight:      SpO2: 100% 100% 100% 100%   CBG (last 3)   Recent Labs  03/29/13 2222 03/30/13 0658 03/30/13 1217  GLUCAP 132* 104* 139*    IV Fluid Intake:   . sodium chloride 1,000 mL (03/26/13 2029)  . heparin 800 Units/hr (03/29/13 2043)    MEDICATIONS  . [facility] HOLD] aspirin EC  325 mg Oral Daily  . [facility] HOLD] atorvastatin  80 mg Oral QHS  . HYDROmorphone      . [MAR  HOLD] insulin aspart  0-15 Units Subcutaneous TID WC  . [MAR HOLD] insulin aspart  0-5 Units Subcutaneous QHS  . [MAR HOLD] lisinopril  20 mg Oral Daily   PRN:  [MAR HOLD] sodium chloride, HYDROmorphone (DILAUDID) injection, ondansetron (ZOFRAN) IV, oxyCODONE, oxyCODONE, [MAR HOLD] senna-docusate  Diet:  NPO  Activity:  OOB with assist DVT Prophylaxis: for OR  CLINICALLY SIGNIFICANT STUDIES Basic Metabolic Panel:   Recent Labs Lab 03/29/13 1130 03/30/13 0505  NA 138 140  K 3.8 3.8  CL 101 103  CO2 29 27  GLUCOSE 193* 109*  BUN 14 15  CREATININE 0.95 0.99  CALCIUM 9.6 9.0   Liver Function Tests:   Recent Labs Lab 03/26/13 1613  AST 20  ALT 18  ALKPHOS 102  BILITOT 0.6  PROT 8.3  ALBUMIN 4.0   CBC:   Recent Labs Lab 03/26/13 1613  03/28/13 1615 03/30/13 0505  WBC 3.7*  < > 5.4 5.5  NEUTROABS 1.1*  --   --   --   HGB 13.1  < > 13.8 12.8*  HCT 38.2*  < > 39.4 36.2*  MCV 80.8  < > 81.4 81.5  PLT 200  < > 230 226  < > = values in this interval not displayed. Coagulation:   Recent Labs Lab 03/26/13 1613 03/30/13 0505  LABPROT 13.4 13.4  INR 1.04 1.04   Cardiac Enzymes:   Recent  Labs Lab 03/26/13 1613  TROPONINI <0.30   Urinalysis: No results found for this basename: COLORURINE, APPERANCEUR, LABSPEC, PHURINE, GLUCOSEU, HGBUR, BILIRUBINUR, KETONESUR, PROTEINUR, UROBILINOGEN, NITRITE, LEUKOCYTESUR,  in the last 168 hours Lipid Panel    Component Value Date/Time   CHOL 137 03/27/2013 0607   TRIG 62 03/27/2013 0607   HDL 31* 03/27/2013 0607   CHOLHDL 4.4 03/27/2013 0607   VLDL 12 03/27/2013 0607   LDLCALC 94 03/27/2013 0607   HgbA1C  Lab Results  Component Value Date   HGBA1C 9.3* 03/27/2013    Urine Drug Screen:   No results found for this basename: labopia,  cocainscrnur,  labbenz,  amphetmu,  thcu,  labbarb    Alcohol Level: No results found for this basename: ETH,  in the last 168 hours  Ct Angio Head W/cm &/or Wo Cm 03/27/2013     1.  Bilateral ICA siphon atherosclerotic plaque. Stenosis at the left ICA anterior genu measures less than 50% with respect to the supra clinoid ICA, but might be numerically underestimated. Right ICA siphon without significant stenosis, and both carotid termini are patent.  2. Mild to moderate irregularity of the left MCA M1 segment, but patent bifurcation and no left MCA branch occlusion identified.  3. Advanced posterior circulation atherosclerosis. Mild to moderate distal right vertebral artery stenosis. 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  4. Stable and negative CT appearance of the brain ; small diffusion positive lesions in the left hemisphere seen yesterday are not yet evident.   Dg Chest 2 View 03/26/2013 No acute findings.    Ct Head Wo Contrast 03/26/2013   No acute intracranial abnormality. No definite acute cortical infarction.    Ct Angio Neck W/cm &/or Wo/cm 03/27/2013    1. Very high-grade stenosis of the left ICA distal bulb, radiographic string-sign, related to bulky and irregular soft and calcified plaque. The cervical left ICA remains patent.  2. 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  3. Diminutive vertebral arteries with bilateral proximal vertebral stenosis (moderate).  4. No significant right cervical carotid stenosis.    Mr Brain Wo Contrast 03/26/2013    Multiple punctate foci of acute ischemia within the left middle cerebral artery territory (left frontoparietal lobes).  No hemorrhagic conversion.  Remote right pontine infarct.   Mr [name]/brain Wo Cm 03/26/2013    Slow flow versus occlusion of the left carotid siphon with inferred retrograde filling of the left anterior circulation via a patent anterior communicating artery.  Normal flow related enhancement of the left middle cerebral arteries and remaining anterior vessels. Findings may be better characterized on CT angiogram the head, as  clinically indicated.  At least 50% narrowing at the origin of the basilar artery, with luminal irregularity, suggesting intracranial atherosclerosis, mildly advanced from prior examination.   2D Echocardiogram  EF 65%, no wall motion abnormalities, LA mildly dilated.  Carotid Doppler  See angio Right - 1% to 39% ICA stenosis. Vertebral artery flow is antegrade with elevated velocities suggestive of a more distal obstruction or stenosis.  Left - 80% to 99% ICA stenosis at the distal bulb /proximal ICA. Vertebral artery flow is antegrade.  CXR    EKG  normal sinus rhythm.   Therapy Recommendations - no followup therapy is recommended  Physical Exam   Neurologic Examination:  Mental Status:  Alert, oriented, thought content appropriate. Speech fluent without evidence of aphasia. Able to  follow commands without difficulty.  Cranial Nerves:  II-Visual fields were normal.  III/IV/VI-Pupils were equal and reacted. Extraocular movements were full and conjugate.  V/VII-no facial numbness and no facial weakness.  VIII-normal.  X-normal speech and symmetrical palatal movement.  Motor:  Strength is 5/5 throughout. Sensory: Reduced perception of touch as well as vibration below the knees bilaterally.  Deep Tendon Reflexes: Asymmetric with slightly greater responses elicited from left extremities compared to right extremities; right ankle jerk was absent.  Plantars: Extensor on the left and flexor on the right  Cerebellar: Normal finger-to-nose testing bilaterally.  Carotid auscultation: Normal   ASSESSMENT Shawn Hood is a 56 y.o. male presenting with transient sensory loss and hemiparesis of right upper extremity and face for 15 minutes on several occasions, symptoms resolved. Imaging confirms a multiple punctate foci of acute ischemia within the left middle cerebral artery territory, frontoparietal lobes. Infarct felt to be thrombotic secondary to severe left carotid artery stenosis.  On  aspirin 325 mg orally every day and clopidogrel 75 mg orally every day prior to admission. Now on aspirin 325 mg orally every day for secondary stroke prevention. Patient with resultant resolved symptoms. Work up complete.   Left internal carotid stenosis, symptomatic  Diabetes mellitus, type 2, uncontrolled, HGB A1C  9.3  Dyslipidemia, LDL 94, goal < 70, on statin  Hypertension  Coronary artery disease   Previous stroke  Hospital day # 4  TREATMENT/PLAN  Continue aspirin 325 mg orally every day for secondary stroke prevention.  Risk factor modification  Gwendolyn Lima. Manson Passey, [facility], MBA, MHA [facility] Pager: [phone] 03/30/2013 3:14 PM  I have personally obtained a history,  evaluated imaging results, and formulated the assessment and plan of care. I agree with the above.  Delia Heady, Md

## 2013-03-30 NOTE — Telephone Encounter (Addendum)
Message copied by Rosalyn Charters on Mon Mar 30, 2013  4:45 PM ------      Message from: Sharee Pimple      Created: Mon Mar 30, 2013 11:57 AM      Regarding: schedule                   ----- Message -----         From: Dara Lords, PA-C         Sent: 03/30/2013  11:39 AM           To: Sharee Pimple, CMA            S/p left CEA 03/30/13.  F/u with Dr. Arbie Cookey in 2 weeks.            Thanks,      Lelon Mast ------  notified patient's daughter of fu appt. on 04-21-13 at 10:30

## 2013-03-30 NOTE — Transfer of Care (Signed)
Immediate Anesthesia Transfer of Care Note  Patient: Shawn Hood  Procedure(s) Performed: Procedure(s): ENDARTERECTOMY CAROTID (Left)  Patient Location: PACU  Anesthesia Type:General  Level of Consciousness: awake, alert , oriented and patient cooperative  Airway & Oxygen Therapy: Patient Spontanous Breathing and Patient connected to nasal cannula oxygen  Post-op Assessment: Report given to PACU RN, Post -op Vital signs reviewed and stable, Patient moving all extremities X 4 and Patient able to stick tongue midline  Post vital signs: Reviewed and stable  Complications: No apparent anesthesia complications

## 2013-03-30 NOTE — Anesthesia Procedure Notes (Signed)
Procedure Name: Intubation Date/Time: 03/30/2013 9:48 AM Performed by: Arlice Colt B Pre-anesthesia Checklist: Patient identified, Emergency Drugs available, Suction available, Patient being monitored and Timeout performed Patient Re-evaluated:Patient Re-evaluated prior to inductionOxygen Delivery Method: Circle system utilized Preoxygenation: Pre-oxygenation with 100% oxygen Intubation Type: IV induction Ventilation: Mask ventilation without difficulty and Oral airway inserted - appropriate to patient size Laryngoscope Size: Mac and 3 Grade View: Grade I Tube type: Oral Tube size: 7.5 mm Number of attempts: 1 Airway Equipment and Method: Stylet Placement Confirmation: ETT inserted through vocal cords under direct vision,  positive ETCO2 and breath sounds checked- equal and bilateral Secured at: 23 cm Tube secured with: Tape Dental Injury: Teeth and Oropharynx as per pre-operative assessment

## 2013-03-30 NOTE — Progress Notes (Signed)
FMTS Attending Note  The plan of care was discussed with the resident team. I agree with the assessment and plan as documented by the resident.   I did not personally examine the patient as he was in the OR for CEA.  Donnella Sham MD

## 2013-03-30 NOTE — Interval H&P Note (Signed)
History and Physical Interval Note:  03/30/2013 9:14 AM  Shawn Hood  has presented today for surgery, with the diagnosis of LEFT ICA STENOSIS  The various methods of treatment have been discussed with the patient and family. After consideration of risks, benefits and other options for treatment, the patient has consented to  Procedure(s): ENDARTERECTOMY CAROTID (Left) as a surgical intervention .  The patient's history has been reviewed, patient examined, no change in status, stable for surgery.  I have reviewed the patient's chart and labs.  Questions were answered to the patient's satisfaction.     Shawn Hood

## 2013-03-30 NOTE — H&P (View-Only) (Signed)
[facility] Progress Note  03/29/2013 8:55 AM HD 3  Subjective:  No complaints.  No new events.  Afebrile HR 40's-50's regular 120's-130's systolic 100% RA  Filed Vitals:   03/29/13 0600  BP: 130/70  Pulse: 45  Temp: 98.2 F (36.8 C)  Resp: 18    CBC    Component Value Date/Time   WBC 5.4 03/28/2013 1615   RBC 4.84 03/28/2013 1615   HGB 13.8 03/28/2013 1615   HCT 39.4 03/28/2013 1615   PLT 230 03/28/2013 1615   MCV 81.4 03/28/2013 1615   MCH 28.5 03/28/2013 1615   MCHC 35.0 03/28/2013 1615   RDW 12.9 03/28/2013 1615   LYMPHSABS 2.2 03/26/2013 1613   MONOABS 0.2 03/26/2013 1613   EOSABS 0.1 03/26/2013 1613   BASOSABS 0.0 03/26/2013 1613    BMET    Component Value Date/Time   NA 137 03/26/2013 1613   K 3.3* 03/26/2013 1613   CL 101 03/26/2013 1613   CO2 24 03/26/2013 1613   GLUCOSE 89 03/26/2013 1613   BUN 16 03/26/2013 1613   CREATININE 0.96 03/26/2013 2058   CALCIUM 9.4 03/26/2013 1613   GFRNONAA >90 03/26/2013 2058   GFRAA >90 03/26/2013 2058    INR    Component Value Date/Time   INR 1.04 03/26/2013 1613    No intake or output data in the 24 hours ending 03/29/13 0855   Assessment/Plan:  56 y.o. male is in need of left CEA HD 3  -plan for left CEA tomorrow by Dr. Early -pt on heparin-Hgb stable.  Will d/c on call to OR tomorrow -continue ASA   Kinslee Dalpe, PA-C [facility] [phone] 03/29/2013 8:55 AM  Left CEA tomorrow by Dr Early  Charles Fields, MD [facility] [place] Office: [phone] Pager: [phone]  

## 2013-03-30 NOTE — Op Note (Signed)
[facility]   Patient name: Shawn Hood MRN: [id number] DOB: 06-28-1955 Sex: male  03/26/2013 - 03/30/2013 Pre-operative Diagnosis:Symptomatic left carotid stenosis Post-operative diagnosis:  Same Surgeon:  Larina Earthly, M.D. Assistants:  Rhyne Procedure:    left carotid Endarterectomy with Dacron patch angioplasty Anesthesia:  General Blood Loss:  See anesthesia record   Indications for surgery:  Severe symptomatic left internal carotid artery stenosis  Procedure in detail:  The patient was taken to the operating and placed in the supine position. The neck was prepped and draped in the usual sterile fashion. An incision was made anterior to the sternocleidomastoid muscle and continued with electrocautery through the platysma muscle. The muscle was retracted posteriorly and the carotid sheath was opened. The facial vein was ligated with 2-0 silk ties and divided. The common carotid artery was encircled with an umbilical tape and Rummel tourniquet. Dissection was continued onto the carotid bifurcation. The superior thyroid artery was controlled with a 2-0 silk Potts tie. The external carotid organ was encircled with a vessel loop and the internal carotid was encircled with umbilical tape and Rummel tourniquet. The hypoglossal and vagus nerves were identified and preserved.  The patient was given systemic heparinization. After adequate circulation time, the internal,external and common carotid arteries were occluded. The common carotid was opened with an 11 blade and the arteriotomy was continued with Potts scissors onto the internal carotid artery. There was good backbleeding and therefore a shunt was not used. t. The endarterectomy was begun on the common carotid artery  plaque was divided proximally with Potts scissors. The endarterectomy was continued onto the carotid bifurcation. The external carotid was endarterectomized by eversion technique and the internal  carotid artery was endarterectomized in an open fashion. Remaining debris was removed from the endarterectomy plane. A Dacron patch was brought to the field and sewn as a patch angioplasty. Prior to completing the anastomosis, the shunt was removed and the usual flushing maneuvers were undertaken. The anastomosis was then completed and flow was restored first to the external and then the internal carotid artery. Excellent flow characteristics were noted with hand-held Doppler in the internal and external carotid arteries.  The patient was given protamine to reverse the heparin. Hemostasis was obtained with electrocautery. The wounds were irrigated with saline. The wound was closed by first reapproximating the sternocleidomastoid muscle over the carotid artery with interrupted 3-0 Vicryl sutures. Next, the platysma was closed with a running 3-0 Vicryl suture. The skin was closed with a 4-0 subcuticular Vicryl suture. Benzoin and Steri-Strips were applied to the incision. A sterile dressing was placed over the incision. All sponge and needle counts were correct. The patient was awakened in the operating room, neurologically intact. They were transferred to the PACU in stable condition.  Carotid stenosis at surgery: Greater than 90%  Disposition:  To PACU in stable condition,neurologically intact  Relevant Operative Details:  Soft high-grade plaque at the internal origin  Larina Earthly, M.D. [facility] [place] Office: [phone] Pager:  [phone]

## 2013-03-30 NOTE — Progress Notes (Signed)
ANTICOAGULATION CONSULT NOTE - Follow Up Consult  Pharmacy Consult for Heparin Indication: left carotid stenosis  No Known Allergies  Patient Measurements: Height: 5\' 11"  (180.3 cm) Weight: 209 lb (94.802 kg) IBW/kg (Calculated) : 75.3 Heparin Dosing Weight: 94kg  Vital Signs: Temp: 97.6 F (36.4 C) (10/06 0600) BP: 133/81 mmHg (10/06 0600) Pulse Rate: 52 (10/06 0600)  Labs:  Recent Labs  03/28/13 1615  03/29/13 0830 03/29/13 1130 03/29/13 1902 03/30/13 0505  HGB 13.8  --   --   --   --  12.8*  HCT 39.4  --   --   --   --  36.2*  PLT 230  --   --   --   --  226  APTT  --   --   --   --   --  71*  LABPROT  --   --   --   --   --  13.4  INR  --   --   --   --   --  1.04  HEPARINUNFRC  --   < > 1.82*  --  0.24* 0.34  CREATININE  --   --   --  0.95  --  0.99  < > = values in this interval not displayed.  Estimated Creatinine Clearance: 99.1 ml/min (by C-G formula based on Cr of 0.99).   Medications:  Scheduled:  . aspirin EC  325 mg Oral Daily  . atorvastatin  80 mg Oral QHS  .  ceFAZolin (ANCEF) IV  2 g Intravenous On Call  . insulin aspart  0-15 Units Subcutaneous TID WC  . insulin aspart  0-5 Units Subcutaneous QHS  . lisinopril  20 mg Oral Daily    Assessment: 57 yr old male presented to ED following transient right sided face numbness and weakness of the right upper extremity. A carotid duplex showed left ICA 80-99% stenosis. To go to surgery today for CEA. Heparin level this morning is therapeutic on 800units/hr. CBC remains stable and no bleeding noted.  Goal of Therapy:  Heparin level goal 0.3-0.5 for CVA Monitor platelets by anticoagulation protocol: Yes   Plan:  1. Continue heparin at 800units/hr 2. Daily HL and CBC if to continue on heparin 3. Follow up plans after today's procedure regarding anticoagulation/adding back Plavix 4. Follow for s/s bleeding  Thuy Atilano D. Patience Nuzzo, PharmD Clinical Pharmacist Pager: [phone] 03/30/2013 8:41 AM

## 2013-03-30 NOTE — Anesthesia Preprocedure Evaluation (Signed)
Anesthesia Evaluation  Patient identified by MRN, date of birth, ID band Patient awake    Reviewed: Allergy & Precautions, H&P , NPO status , Patient's Chart, lab work & pertinent test results  Airway Mallampati: II  Neck ROM: full    Dental   Pulmonary shortness of breath,          Cardiovascular hypertension, + CAD, + Past MI and + CABG     Neuro/Psych CVA    GI/Hepatic   Endo/Other  diabetes, Type 2  Renal/GU      Musculoskeletal   Abdominal   Peds  Hematology   Anesthesia Other Findings   Reproductive/Obstetrics                           Anesthesia Physical Anesthesia Plan  ASA: III  Anesthesia Plan: General   Post-op Pain Management:    Induction: Intravenous  Airway Management Planned: Oral ETT  Additional Equipment: Arterial line  Intra-op Plan:   Post-operative Plan: Extubation in OR  Informed Consent: I have reviewed the patients History and Physical, chart, labs and discussed the procedure including the risks, benefits and alternatives for the proposed anesthesia with the patient or authorized representative who has indicated his/her understanding and acceptance.     Plan Discussed with: CRNA, Anesthesiologist and Surgeon  Anesthesia Plan Comments:         Anesthesia Quick Evaluation

## 2013-03-30 NOTE — OR Nursing (Signed)
Patient displayed strength in right hand, slight weakness in left hand; strength in bilateral feet; tongue midline; pre-op.

## 2013-03-31 DIAGNOSIS — G459 Transient cerebral ischemic attack, unspecified: Secondary | ICD-10-CM | POA: Diagnosis present

## 2013-03-31 DIAGNOSIS — I6529 Occlusion and stenosis of unspecified carotid artery: Secondary | ICD-10-CM

## 2013-03-31 LAB — BASIC METABOLIC PANEL
BUN: 15 mg/dL (ref 6–23)
Calcium: 8.8 mg/dL (ref 8.4–10.5)
Chloride: 104 mEq/L (ref 96–112)
Creatinine, Ser: 0.82 mg/dL (ref 0.50–1.35)
GFR calc Af Amer: >90 mL/min (ref >90)
GFR calc non Af Amer: >90 mL/min (ref >90)
Potassium: 3.7 mEq/L (ref 3.5–5.1)

## 2013-03-31 LAB — CBC
MCH: 28.6 pg (ref 26.0–34.0)
MCHC: 35.3 g/dL (ref 30.0–36.0)
Platelets: 235 10*3/uL (ref 150–400)
RBC: 4.26 MIL/uL (ref 4.22–5.81)
RDW: 13 % (ref 11.5–15.5)
WBC: 9.7 10*3/uL (ref 4.0–10.5)

## 2013-03-31 LAB — GLUCOSE, CAPILLARY

## 2013-03-31 MED ORDER — ASPIRIN 81 MG PO TABS: 81.0000 mg | ORAL_TABLET | Freq: Every day | ORAL | Status: AC

## 2013-03-31 NOTE — Progress Notes (Signed)
Patient being discharged home per MD order. All discharge instructions given to patient, verbalized understanding of them. Patient going home with wife.

## 2013-03-31 NOTE — Progress Notes (Signed)
Stroke Team Progress Note  HISTORY Shawn Hood is a 57 y.o. male with a history of diabetes mellitus, hypertension, hyperlipidemia, previous stroke and coronary artery disease, presenting 03/26/2013 with history of transient numbness involving right upper extremity and right side of the face as well as weakness and coordination abnormality of right upper extremity. Initial symptoms occurred night of 03/25/2013 at about 9 PM and lasted about 15 minutes involving numbness of his right hand. It recurrent numbness at 9 AM 03/26/2013 involving right upper extremity and right side of his face. He also noticed difficulty lifting his right arm as well as coordination difficulty of his right upper extremity. Symptoms again lasted about 15 minutes then resolved completely. CT scan of his head showed no acute intracranial abnormality. Patient has been taking aspirin and Plavix daily. NIH stroke score on admission was 0. He was last known well at 9 AM 03/26/2013.   LSN: 9 AM on 03/26/2013  tPA Given: No: Deficits resolved  MRankin: 0  Patient was not a TPA candidate secondary to resolution of deficits. He was admitted for further evaluation and treatment.  SUBJECTIVE  Patient silling at bedside. Just back from walk in hall. No neurological complaints, no symptoms, back to normal.   OBJECTIVE Most recent Vital Signs: Filed Vitals:   03/30/13 1836 03/30/13 2015 03/30/13 2325 03/31/13 0345  BP: 128/52 140/61 133/58 134/54  Pulse: 56 61 55 47  Temp: 98.6 F (37 C) 98 F (36.7 C) 99.2 F (37.3 C) 98.4 F (36.9 C)  TempSrc: Oral Oral Oral Oral  Resp: 16 22 24 14   Height: 5\' 11"  (1.803 m)     Weight: 97.8 kg (215 lb 9.8 oz)     SpO2: 100% 98% 100% 96%   CBG (last 3)   Recent Labs  03/30/13 1217 03/30/13 1735 03/30/13 2247  GLUCAP 139* 154* 181*    IV Fluid Intake:   . sodium chloride 75 mL/hr at 03/30/13 2300  . DOPamine      MEDICATIONS  . aspirin EC  325 mg Oral Daily  . atorvastatin   80 mg Oral QHS  . docusate sodium  100 mg Oral Daily  . enoxaparin (LOVENOX) injection  40 mg Subcutaneous Q24H  . insulin aspart  0-15 Units Subcutaneous TID WC  . insulin aspart  0-5 Units Subcutaneous QHS  . lisinopril  20 mg Oral Daily  . pantoprazole  40 mg Oral Daily   PRN:  acetaminophen, acetaminophen, alum & mag hydroxide-simeth, guaiFENesin-dextromethorphan, hydrALAZINE, labetalol, metoprolol, morphine injection, ondansetron, oxyCODONE, phenol, senna-docusate  Diet:  Carb Control  Activity:  OOB with assist DVT Prophylaxis: lovenox.  CLINICALLY SIGNIFICANT STUDIES Basic Metabolic Panel:   Recent Labs Lab 03/30/13 0505 03/31/13 0431  NA 140 139  K 3.8 3.7  CL 103 104  CO2 27 24  GLUCOSE 109* 139*  BUN 15 15  CREATININE 0.99 0.82  CALCIUM 9.0 8.8   Liver Function Tests:   Recent Labs Lab 03/26/13 1613  AST 20  ALT 18  ALKPHOS 102  BILITOT 0.6  PROT 8.3  ALBUMIN 4.0   CBC:   Recent Labs Lab 03/26/13 1613  03/30/13 0505 03/31/13 0431  WBC 3.7*  < > 5.5 9.7  NEUTROABS 1.1*  --   --   --   HGB 13.1  < > 12.8* 12.2*  HCT 38.2*  < > 36.2* 34.6*  MCV 80.8  < > 81.5 81.2  PLT 200  < > 226 235  < > = values  in this interval not displayed. Coagulation:   Recent Labs Lab 03/26/13 1613 03/30/13 0505  LABPROT 13.4 13.4  INR 1.04 1.04   Cardiac Enzymes:   Recent Labs Lab 03/26/13 1613  TROPONINI <0.30   Urinalysis: No results found for this basename: COLORURINE, APPERANCEUR, LABSPEC, PHURINE, GLUCOSEU, HGBUR, BILIRUBINUR, KETONESUR, PROTEINUR, UROBILINOGEN, NITRITE, LEUKOCYTESUR,  in the last 168 hours Lipid Panel    Component Value Date/Time   CHOL 137 03/27/2013 0607   TRIG 62 03/27/2013 0607   HDL 31* 03/27/2013 0607   CHOLHDL 4.4 03/27/2013 0607   VLDL 12 03/27/2013 0607   LDLCALC 94 03/27/2013 0607   HgbA1C  Lab Results  Component Value Date   HGBA1C 9.3* 03/27/2013    Urine Drug Screen:   No results found for this basename: labopia,   cocainscrnur,  labbenz,  amphetmu,  thcu,  labbarb    Alcohol Level: No results found for this basename: ETH,  in the last 168 hours  Ct Angio Head W/cm &/or Wo Cm 03/27/2013     1. Bilateral ICA siphon atherosclerotic plaque. Stenosis at the left ICA anterior genu measures less than 50% with respect to the supra clinoid ICA, but might be numerically underestimated. Right ICA siphon without significant stenosis, and both carotid termini are patent.  2. Mild to moderate irregularity of the left MCA M1 segment, but patent bifurcation and no left MCA branch occlusion identified.  3. Advanced posterior circulation atherosclerosis. Mild to moderate distal right vertebral artery stenosis. 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  4. Stable and negative CT appearance of the brain ; small diffusion positive lesions in the left hemisphere seen yesterday are not yet evident.   Dg Chest 2 View 03/26/2013 No acute findings.    Ct Head Wo Contrast 03/26/2013   No acute intracranial abnormality. No definite acute cortical infarction.    Ct Angio Neck W/cm &/or Wo/cm 03/27/2013    1. Very high-grade stenosis of the left ICA distal bulb, radiographic string-sign, related to bulky and irregular soft and calcified plaque. The cervical left ICA remains patent.  2. 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  3. Diminutive vertebral arteries with bilateral proximal vertebral stenosis (moderate).  4. No significant right cervical carotid stenosis.    Mr Brain Wo Contrast 03/26/2013    Multiple punctate foci of acute ischemia within the left middle cerebral artery territory (left frontoparietal lobes).  No hemorrhagic conversion.  Remote right pontine infarct.   Mr [name]/brain Wo Cm 03/26/2013    Slow flow versus occlusion of the left carotid siphon with inferred retrograde filling of the left anterior circulation via a patent anterior  communicating artery.  Normal flow related enhancement of the left middle cerebral arteries and remaining anterior vessels. Findings may be better characterized on CT angiogram the head, as clinically indicated.  At least 50% narrowing at the origin of the basilar artery, with luminal irregularity, suggesting intracranial atherosclerosis, mildly advanced from prior examination.   2D Echocardiogram  EF 65%, no wall motion abnormalities, LA mildly dilated.  Carotid Doppler  See angio Right - 1% to 39% ICA stenosis. Vertebral artery flow is antegrade with elevated velocities suggestive of a more distal obstruction or stenosis.  Left - 80% to 99% ICA stenosis at the distal bulb /proximal ICA. Vertebral artery flow is antegrade.  CXR    EKG  normal sinus rhythm.   Therapy Recommendations -  no followup therapy is recommended  Physical Exam   Neurologic Examination:  Mental Status:  Alert, oriented, thought content appropriate. Speech fluent without evidence of aphasia. Able to follow commands without difficulty.  Cranial Nerves:  II-Visual fields were normal.  III/IV/VI-Pupils were equal and reacted. Extraocular movements were full and conjugate.  V/VII-no facial numbness and no facial weakness.  VIII-normal.  X-normal speech and symmetrical palatal movement.  Motor:  Strength is 5/5 throughout. Sensory: intact throughout UE/LE.   Plantars: Extensor on the left and flexor on the right  Cerebellar: Normal finger-to-nose testing bilaterally.  Carotid auscultation: Normal Skin: s/p LCEA scar. Well approximated. No drainage.   ASSESSMENT Shawn Hood is a 57 y.o. male presenting with transient sensory loss and hemiparesis of right upper extremity and face for 15 minutes on several occasions, symptoms resolved. Imaging confirms a multiple punctate foci of acute ischemia within the left middle cerebral artery territory, frontoparietal lobes. Infarct felt to be thrombotic secondary to severe  left carotid artery stenosis.  On aspirin 325 mg orally every day and clopidogrel 75 mg orally every day prior to admission. Now on aspirin 325 mg orally every day for secondary stroke prevention. Patient with resultant resolved symptoms. Work up complete.   Left internal carotid stenosis, symptomatic: s/p LCEA on 03/31/2013, hemodynamically stable post op  Diabetes mellitus, type 2, uncontrolled, HGB A1C  9.3  Dyslipidemia, LDL 94, goal < 70, on statin  Hypertension  Coronary artery disease   Previous stroke  Hospital day # 5  TREATMENT/PLAN  Continue aspirin 325 mg orally every day for secondary stroke prevention. Patient was on plavix prior to admission. Patient has had drug eluding stent as of 02/2012. Cardiology will need to be contacted to decide if plavix will need to be restarted.  Risk factor modification  Follow up with Dr. Arbie Cookey on 04/18/2013 at 1030 am .  Follow up with Dr. Pearlean Brownie in 2 months.  Stroke service will sign off  Gwendolyn Lima. Manson Passey, [facility], MBA, MHA [facility] Pager: [phone] 03/31/2013 7:51 AM  I have personally obtained a history,  evaluated imaging results, and formulated the assessment and plan of care. I agree with the above.  Delia Heady, MD

## 2013-03-31 NOTE — Progress Notes (Addendum)
[facility] Progress Note  03/31/2013 7:31 AM 1 Day Post-Op  Subjective:  No complaints this am; denies trouble swallowing  Tm 99.2 now afebrile HR 40's-50's 110's-130's systolic 96% RA  Filed Vitals:   03/31/13 0345  BP: 134/54  Pulse: 47  Temp: 98.4 F (36.9 C)  Resp: 14     Physical Exam: Neuro:  In tact without deficit Incision:  C/d/i  CBC    Component Value Date/Time   WBC 9.7 03/31/2013 0431   RBC 4.26 03/31/2013 0431   HGB 12.2* 03/31/2013 0431   HCT 34.6* 03/31/2013 0431   PLT 235 03/31/2013 0431   MCV 81.2 03/31/2013 0431   MCH 28.6 03/31/2013 0431   MCHC 35.3 03/31/2013 0431   RDW 13.0 03/31/2013 0431   LYMPHSABS 2.2 03/26/2013 1613   MONOABS 0.2 03/26/2013 1613   EOSABS 0.1 03/26/2013 1613   BASOSABS 0.0 03/26/2013 1613    BMET    Component Value Date/Time   NA 139 03/31/2013 0431   K 3.7 03/31/2013 0431   CL 104 03/31/2013 0431   CO2 24 03/31/2013 0431   GLUCOSE 139* 03/31/2013 0431   BUN 15 03/31/2013 0431   CREATININE 0.82 03/31/2013 0431   CALCIUM 8.8 03/31/2013 0431   GFRNONAA >90 03/31/2013 0431   GFRAA >90 03/31/2013 0431     Intake/Output Summary (Last 24 hours) at 03/31/13 0731 Last data filed at 03/31/13 1610  Gross per 24 hour  Intake 1602.5 ml  Output   1250 ml  Net  352.5 ml      Assessment/Plan:  This is a 57 y.o. male who is s/p left CEA 1 Day Post-Op  -pt is doing well this am. -pt neuro exam is in tact -pt has not ambulated in the halls.  Will need to eat breakfast and ambulate -discharge is okay vascular standpoint. -f/u with Dr. Arbie Cookey in 2 weeks- 04/21/13 at 10:30   Doreatha Massed, PA-C [facility] [phone] I have examined the patient, reviewed and agree with above. Neuro intact. Neck healing nicely. Okay to discharge home from vascular surgery standpoint. No need for Plavix from vascular surgery standpoint.  Jabez Molner, MD 03/31/2013 8:13 AM

## 2013-03-31 NOTE — Progress Notes (Signed)
SUBJECTIVE:  No chest pain.  No SOB.    PHYSICAL EXAM Filed Vitals:   03/30/13 2325 03/31/13 0345 03/31/13 0800 03/31/13 1023  BP: 133/58 134/54 121/53 121/56  Pulse: 55 47    Temp: 99.2 F (37.3 C) 98.4 F (36.9 C) 98.2 F (36.8 C)   TempSrc: Oral Oral Oral   Resp: 24 14    Height:      Weight:      SpO2: 100% 96%     General:  No distress Lungs:  Clear Heart:  RRR Abdomen:  Positive bowel sounds, no rebound no guarding Extremities:  No edema  LABS:  Results for orders placed during the hospital encounter of 03/26/13 (from the past 24 hour(s))  GLUCOSE, CAPILLARY     Status: Abnormal   Collection Time    03/30/13 12:17 PM      Result Value Range   Glucose-Capillary 139 (*) 70 - 99 mg/dL   Comment 1 Notify RN    GLUCOSE, CAPILLARY     Status: Abnormal   Collection Time    03/30/13  5:35 PM      Result Value Range   Glucose-Capillary 154 (*) 70 - 99 mg/dL  GLUCOSE, CAPILLARY     Status: Abnormal   Collection Time    03/30/13 10:47 PM      Result Value Range   Glucose-Capillary 181 (*) 70 - 99 mg/dL  CBC     Status: Abnormal   Collection Time    03/31/13  4:31 AM      Result Value Range   WBC 9.7  4.0 - 10.5 K/uL   RBC 4.26  4.22 - 5.81 MIL/uL   Hemoglobin 12.2 (*) 13.0 - 17.0 g/dL   HCT 16.1 (*) 09.6 - 04.5 %   MCV 81.2  78.0 - 100.0 fL   MCH 28.6  26.0 - 34.0 pg   MCHC 35.3  30.0 - 36.0 g/dL   RDW 40.9  81.1 - 91.4 %   Platelets 235  150 - 400 K/uL  BASIC METABOLIC PANEL     Status: Abnormal   Collection Time    03/31/13  4:31 AM      Result Value Range   Sodium 139  135 - 145 mEq/L   Potassium 3.7  3.5 - 5.1 mEq/L   Chloride 104  96 - 112 mEq/L   CO2 24  19 - 32 mEq/L   Glucose, Bld 139 (*) 70 - 99 mg/dL   BUN 15  6 - 23 mg/dL   Creatinine, Ser 7.82  0.50 - 1.35 mg/dL   Calcium 8.8  8.4 - 95.6 mg/dL   GFR calc non Af Amer >90  >90 mL/min   GFR calc Af Amer >90  >90 mL/min  GLUCOSE, CAPILLARY     Status: Abnormal   Collection Time   03/31/13  7:44 AM      Result Value Range   Glucose-Capillary 124 (*) 70 - 99 mg/dL   Comment 1 Notify RN     Comment 2 Documented in Chart      Intake/Output Summary (Last 24 hours) at 03/31/13 1102 Last data filed at 03/31/13 1000  Gross per 24 hour  Intake 2082.5 ml  Output   1375 ml  Net  707.5 ml    TELEMETRY:   NSR with rare ventricular ectopy.  No sustained bradycardia.   ASSESSMENT AND PLAN:  CEA:  Doing well post op.   CAD (coronary artery disease) of  artery bypass graft:    No evidence of active ischemia.   Bradycardia:  Telemetry reviewed.  As above.  No change in therapy.     Shawn Hood [facility] 03/31/2013 11:02 AM

## 2013-04-03 ENCOUNTER — Encounter (HOSPITAL_COMMUNITY): Payer: Self-pay | Admitting: Vascular Surgery

## 2013-04-03 NOTE — Discharge Summary (Signed)
I agree with the discharge summary as documented.   Jaquarius Seder MD  

## 2013-04-18 ENCOUNTER — Encounter (HOSPITAL_COMMUNITY): Payer: Self-pay | Admitting: Emergency Medicine

## 2013-04-18 ENCOUNTER — Emergency Department (HOSPITAL_COMMUNITY)
Admission: EM | Admit: 2013-04-18 | Discharge: 2013-04-18 | Disposition: A | Discharge status: Home / Self Care | Payer: Medicare Other | Attending: Emergency Medicine | Admitting: Emergency Medicine

## 2013-04-18 DIAGNOSIS — R220 Localized swelling, mass and lump, head: Secondary | ICD-10-CM | POA: Insufficient documentation

## 2013-04-18 DIAGNOSIS — IMO0001 Reserved for inherently not codable concepts without codable children: Secondary | ICD-10-CM | POA: Insufficient documentation

## 2013-04-18 DIAGNOSIS — Z8673 Personal history of transient ischemic attack (TIA), and cerebral infarction without residual deficits: Secondary | ICD-10-CM | POA: Insufficient documentation

## 2013-04-18 DIAGNOSIS — Z7902 Long term (current) use of antithrombotics/antiplatelets: Secondary | ICD-10-CM | POA: Insufficient documentation

## 2013-04-18 DIAGNOSIS — R21 Rash and other nonspecific skin eruption: Secondary | ICD-10-CM | POA: Insufficient documentation

## 2013-04-18 DIAGNOSIS — Z7982 Long term (current) use of aspirin: Secondary | ICD-10-CM | POA: Insufficient documentation

## 2013-04-18 DIAGNOSIS — I2510 Atherosclerotic heart disease of native coronary artery without angina pectoris: Secondary | ICD-10-CM | POA: Insufficient documentation

## 2013-04-18 DIAGNOSIS — Z87891 Personal history of nicotine dependence: Secondary | ICD-10-CM | POA: Insufficient documentation

## 2013-04-18 DIAGNOSIS — I10 Essential (primary) hypertension: Secondary | ICD-10-CM | POA: Insufficient documentation

## 2013-04-18 DIAGNOSIS — Z794 Long term (current) use of insulin: Secondary | ICD-10-CM | POA: Insufficient documentation

## 2013-04-18 DIAGNOSIS — T7840XA Allergy, unspecified, initial encounter: Secondary | ICD-10-CM

## 2013-04-18 DIAGNOSIS — Z888 Allergy status to other drugs, medicaments and biological substances status: Secondary | ICD-10-CM | POA: Insufficient documentation

## 2013-04-18 DIAGNOSIS — Z951 Presence of aortocoronary bypass graft: Secondary | ICD-10-CM | POA: Insufficient documentation

## 2013-04-18 DIAGNOSIS — E785 Hyperlipidemia, unspecified: Secondary | ICD-10-CM | POA: Insufficient documentation

## 2013-04-18 DIAGNOSIS — I252 Old myocardial infarction: Secondary | ICD-10-CM | POA: Insufficient documentation

## 2013-04-18 DIAGNOSIS — Z79899 Other long term (current) drug therapy: Secondary | ICD-10-CM | POA: Insufficient documentation

## 2013-04-18 DIAGNOSIS — T465X5A Adverse effect of other antihypertensive drugs, initial encounter: Secondary | ICD-10-CM | POA: Insufficient documentation

## 2013-04-18 DIAGNOSIS — Z9861 Coronary angioplasty status: Secondary | ICD-10-CM | POA: Insufficient documentation

## 2013-04-18 MED ORDER — DIPHENHYDRAMINE HCL 50 MG/ML IJ SOLN
25.0000 mg | Freq: Once | INTRAMUSCULAR | Status: AC
Start: 2013-04-18 — End: 2013-04-18
  Administered 2013-04-18: 25 mg via INTRAVENOUS
  Filled 2013-04-18: qty 1

## 2013-04-18 MED ORDER — DIPHENHYDRAMINE HCL 25 MG PO CAPS: 25.0000 mg | ORAL_CAPSULE | Freq: Four times a day (QID) | ORAL | Status: AC | PRN

## 2013-04-18 MED ORDER — FAMOTIDINE IN NACL 20-0.9 MG/50ML-% IV SOLN
20.0000 mg | Freq: Once | INTRAVENOUS | Status: AC
  Administered 2013-04-18: 20 mg via INTRAVENOUS
  Filled 2013-04-18: qty 50

## 2013-04-18 MED ORDER — RANITIDINE HCL 150 MG PO TABS: 150.0000 mg | ORAL_TABLET | Freq: Two times a day (BID) | ORAL | Status: DC

## 2013-04-18 MED ORDER — EPINEPHRINE 0.3 MG/0.3ML IJ SOAJ: 0.3000 mg | Freq: Once | INTRAMUSCULAR | Status: DC

## 2013-04-18 MED ORDER — PREDNISONE 50 MG PO TABS: 50.0000 mg | ORAL_TABLET | Freq: Every day | ORAL | Status: DC

## 2013-04-18 MED ORDER — METHYLPREDNISOLONE SODIUM SUCC 125 MG IJ SOLR
125.0000 mg | Freq: Once | INTRAMUSCULAR | Status: AC
  Administered 2013-04-18: 125 mg via INTRAVENOUS
  Filled 2013-04-18: qty 2

## 2013-04-18 NOTE — ED Provider Notes (Signed)
CSN: [id number]     Arrival date & time 04/18/13  0300 History   First MD Initiated Contact with Patient 04/18/13 [phone]     Chief Complaint  Patient presents with  . Urticaria   (Consider location/radiation/quality/duration/timing/severity/associated sxs/prior Treatment) HPI Comments: Pt comes in with cc of itching and rash. Symptoms started on Wednesday, when he noticed some tongue swelling, followed by diffuse rash over the body with itching. His tongue swelling ceased with the benadryl, but his rash persisted. This morning, he wokeup and noticed increase facial swelling. At no point did he have dib, wheezing, difficulty swallowing.  Patient is a 57 y.o. male presenting with urticaria. The history is provided by the patient.  Urticaria Pertinent negatives include no chest pain and no shortness of breath.    Past Medical History  Diagnosis Date  . Diabetes mellitus type 2, uncontrolled, with complications DX: Age 4  . Hypertension   . Coronary artery disease     [facility]era. s/p 3-vessel CABG 2002. b NSTEMI 09/2010 with patent grafts, no obvious culprit, for med rx.  c. DES to SVG-OM, severe diffuse disease per [facility].  c.  DES to SVG to OM  9/13  . Hyperlipidemia   . 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  . Sinus bradycardia   . Myocardial infarction 2002  . Shortness of breath   . Stroke 2006 ?    left arm & leg   Past Surgical History  Procedure Laterality Date  . Coronary artery bypass graft  2002  . Shoulder surgery Left   . Cholecystectomy    . Coronary angioplasty with stent placement  2013  . Endarterectomy Left 03/30/2013    Procedure: ENDARTERECTOMY CAROTID;  Surgeon: Larina Earthly, MD;  Location: [facility];  Service: Vascular;  Laterality: Left;   Family History  Problem Relation Age of Onset  . Coronary artery disease Mother     Died at 61 of MI  . Coronary artery disease Brother     Died of CAD but had other medical problems  . Diabetes  Mother   . Hypertension Mother   . Diabetes Sister   . Diabetes Brother   . Hypertension Brother    History  Substance Use Topics  . Smoking status: Former Smoker -- 1.50 packs/day for 10 years    Types: Cigarettes    Quit date: 06/26/1991  . Smokeless tobacco: Never Used     Comment: Quit 1993  . Alcohol Use: No    Review of Systems  Constitutional: Negative for fever.  HENT: Positive for facial swelling. Negative for drooling and sore throat.   Respiratory: Negative for shortness of breath and wheezing.   Cardiovascular: Negative for chest pain.  Skin: Positive for rash.  Allergic/Immunologic: Negative for environmental allergies, food allergies and immunocompromised state.    Allergies  Review of patient's allergies indicates no known allergies.  Home Medications   Current Outpatient Rx  Name  Route  Sig  Dispense  Refill  . aspirin 81 MG tablet   Oral   Take 1 tablet (81 mg total) by mouth daily.   30 tablet   0   . atorvastatin (LIPITOR) 80 MG tablet   Oral   Take 80 mg by mouth at bedtime.         . clopidogrel (PLAVIX) 75 MG tablet   Oral   Take 75 mg by mouth daily.         Marland Kitchen  insulin aspart (NOVOLOG) 100 UNIT/ML injection   Subcutaneous   Inject 10 Units into the skin 3 (three) times daily before meals.          . insulin glargine (LANTUS) 100 UNIT/ML injection   Subcutaneous   Inject 30 Units into the skin at bedtime.          . metFORMIN (GLUCOPHAGE) 500 MG tablet   Oral   Take 500 mg by mouth 2 (two) times daily with a meal.         . nitroGLYCERIN (NITROSTAT) 0.4 MG SL tablet   Sublingual   Place 1 tablet (0.4 mg total) under the tongue every 5 (five) minutes as needed for chest pain.   30 tablet   0   . quinapril (ACCUPRIL) 20 MG tablet   Oral   Take 20 mg by mouth daily.           BP 156/82  Pulse 97  Temp(Src) 98.5 F (36.9 C) (Oral)  Resp 18  SpO2 99% Physical Exam  Nursing note and vitals  reviewed. Constitutional: He is oriented to person, place, and time. He appears well-developed.  HENT:  Head: Normocephalic and atraumatic.  No oral mucosa swelling  Eyes: Conjunctivae and EOM are normal. Pupils are equal, round, and reactive to light.  Neck: Normal range of motion. Neck supple.  Cardiovascular: Normal rate and regular rhythm.   Pulmonary/Chest: Effort normal and breath sounds normal. No respiratory distress. He has no wheezes.  Abdominal: Soft. Bowel sounds are normal. He exhibits no distension. There is no tenderness. There is no rebound and no guarding.  Neurological: He is alert and oriented to person, place, and time.  Skin: Skin is warm. Rash noted.  Diffuse urticarial rash    ED Course  Procedures (including critical care time) Labs Review Labs Reviewed - No data to display Imaging Review No results found.  EKG Interpretation   None       MDM  No diagnosis found.  Pt comes in with cc of rash. He has an urticarial rash, and had some facial swelling which got him concerned. On my exam, he has no wheezing, face is symmetric, and oral mucosa is normal,. No hx of allergies, no new mews, he is on ACE.  Asked him to stop ACE see PCP on Monday. Will give burst steroids.   Derwood Kaplan, MD 04/18/13 [phone]

## 2013-04-18 NOTE — ED Notes (Signed)
Pt. reports generalized body itching with rashes/ hives onset Wednesday , pt. Took Benadryl with temporary relief , respirations unlabored , airway intact / denies pain .

## 2013-04-20 ENCOUNTER — Encounter: Payer: Self-pay | Admitting: Vascular Surgery

## 2013-04-21 ENCOUNTER — Ambulatory Visit (INDEPENDENT_AMBULATORY_CARE_PROVIDER_SITE_OTHER): Payer: Medicare Other | Admitting: Vascular Surgery

## 2013-04-21 ENCOUNTER — Encounter: Payer: Self-pay | Admitting: Vascular Surgery

## 2013-04-21 DIAGNOSIS — I6529 Occlusion and stenosis of unspecified carotid artery: Secondary | ICD-10-CM

## 2013-04-21 NOTE — Progress Notes (Signed)
The patient has today for followup of his carotid endarterectomy on 03/30/2013 for severe symptomatic left carotid stenosis. He was admitted to the hospital with a stroke and had good Shawn Hood resolution. He underwent endarterectomy during that admission. He had no postoperative complications. He was discharged home. He denies any new neurologic deficits. He does have the usual. Incision on this with shaving.  Physical exam well-developed gentleman in no acute distress and grossly intact neurologically. Left neck incision well healed with no bruits bilaterally  Impression and plan stable Shawn Hood followup after endarterectomy for symptomatic disease. He will be seen again in 6 months with repeat carotid duplex to rule out any carotid stenosis. He'll notify should he develop any wound issues or new deficits

## 2013-10-26 ENCOUNTER — Encounter: Payer: Self-pay | Admitting: Vascular Surgery

## 2013-10-27 ENCOUNTER — Ambulatory Visit (HOSPITAL_COMMUNITY)
Admission: RE | Admit: 2013-10-27 | Discharge: 2013-10-27 | Disposition: A | Discharge status: Home / Self Care | Payer: Medicare Other | Source: Ambulatory Visit | Attending: Vascular Surgery | Admitting: Vascular Surgery

## 2013-10-27 ENCOUNTER — Ambulatory Visit (INDEPENDENT_AMBULATORY_CARE_PROVIDER_SITE_OTHER): Payer: Medicare Other | Admitting: Vascular Surgery

## 2013-10-27 ENCOUNTER — Encounter: Payer: Self-pay | Admitting: Vascular Surgery

## 2013-10-27 VITALS — BP 140/71 | HR 64 | Resp 18 | Ht 72.0 in | Wt 216.8 lb

## 2013-10-27 DIAGNOSIS — I6529 Occlusion and stenosis of unspecified carotid artery: Secondary | ICD-10-CM | POA: Insufficient documentation

## 2013-10-27 NOTE — Addendum Note (Signed)
Addended by: Sharee PimpleMCCHESNEY, Sumeet Geter K on: 10/27/2013 04:09 PM   Modules accepted: Orders

## 2013-10-27 NOTE — Progress Notes (Signed)
[facility] [place] HISTORY AND PHYSICAL   CC:  Follow up carotid duplex scan  Referring Provider:  Hyman HopesBurns, Harriett P, MD  HPI: This is a 58 y.o. male who has known carotid stenosis is here for f/u carotid duplex scan.  Denies amaurosis fugax, paresthesias, or hemiparesis.  He is s/p left CEA 03/30/13.  He has done quite well and does have minimal numbness around the incision.  He is on a statin for his cholesterol.  He is on insulin for his diabetes. He is on an ACEI for his HTN.  Past Medical History  Diagnosis Date  . Diabetes mellitus type 2, uncontrolled, with complications DX: Age 58  . Hypertension   . Coronary artery disease     [facility]. s/p 3-vessel CABG 2002. b NSTEMI 09/2010 with patent grafts, no obvious culprit, for med rx.  c. DES to SVG-OM, severe diffuse disease per [facility].  c.  DES to SVG to OM  9/13  . Hyperlipidemia   . 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  . Sinus bradycardia   . Myocardial infarction 2002  . Shortness of breath   . Stroke 2006 ?    left arm & leg   Past Surgical History  Procedure Laterality Date  . Coronary artery bypass graft  2002  . Shoulder surgery Left   . Cholecystectomy    . Coronary angioplasty with stent placement  2013  . Endarterectomy Left 03/30/2013    Procedure: ENDARTERECTOMY CAROTID;  Surgeon: Larina Earthlyodd F Early, MD;  [facility];  Service: Vascular;  Laterality: Left;    Allergies  Allergen Reactions  . Accupril [Quinapril Hcl]     Skin rash,  Lip swelling     Current Outpatient Prescriptions  Medication Sig Dispense Refill  . aspirin 81 MG tablet Take 1 tablet (81 mg total) by mouth daily.  30 tablet  0  . atorvastatin (LIPITOR) 80 MG tablet Take 80 mg by mouth at bedtime.      . clopidogrel (PLAVIX) 75 MG tablet Take 75 mg by mouth daily.      . diphenhydrAMINE (BENADRYL) 25 mg capsule Take 1 capsule (25 mg total) by mouth every 6 (six) hours as needed for  itching.  30 capsule  0  . insulin aspart (NOVOLOG) 100 UNIT/ML injection Inject 10 Units into the skin 3 (three) times daily before meals.       . insulin glargine (LANTUS) 100 UNIT/ML injection Inject 30 Units into the skin at bedtime.       . metFORMIN (GLUCOPHAGE) 500 MG tablet Take 500 mg by mouth 2 (two) times daily with a meal.      . EPINEPHrine (EPIPEN 2-PAK) 0.3 mg/0.3 mL SOAJ injection Inject 0.3 mLs (0.3 mg total) into the muscle once.  2 Device  1  . nitroGLYCERIN (NITROSTAT) 0.4 MG SL tablet Place 1 tablet (0.4 mg total) under the tongue every 5 (five) minutes as needed for chest pain.  30 tablet  0  . predniSONE (DELTASONE) 50 MG tablet Take 1 tablet (50 mg total) by mouth daily.  5 tablet  0  . quinapril (ACCUPRIL) 20 MG tablet Take 20 mg by mouth daily.       . ranitidine (ZANTAC) 150 MG tablet Take 1 tablet (150 mg total) by mouth 2 (two) times daily.  10 tablet  0   No current facility-administered medications for this visit.    Pt's  meds include: Statin:  yes Beta Blocker:  no Aspirin:  yes Other antiplatelets/anticoagulants:  yes Plavix   Family History  Problem Relation Age of Onset  . Coronary artery disease Mother     Died at 35 of MI  . Coronary artery disease Brother     Died of CAD but had other medical problems  . Diabetes Mother   . Hypertension Mother   . Diabetes Sister   . Diabetes Brother   . Hypertension Brother     History   Social History  . Marital Status: Married    Spouse Name: N/A    Number of Children: 4  . Years of Education: 12th grade   Occupational History  . Disabled     since 2005, for his heart disease. Previously worked in [employer].   Social History Main Topics  . Smoking status: Former Smoker -- 1.50 packs/day for 10 years    Types: Cigarettes    Quit date: 06/26/1991  . Smokeless tobacco: Never Used     Comment: Quit 1993  . Alcohol Use: No  . Drug Use: No  . Sexual Activity: Yes   Other Topics Concern  . Not  on file   Social History Narrative   Disabled from heart problems, former Education officer, environmental. No hx ETOH abuse.       ROS: [x]  Positive   [ ]  Negative   [ ]  All sytems reviewed and are negative  Cardiovascular: []  chest pain/pressure []  palpitations []  SOB lying flat xDOE [x]  pain in legs while walking [x]  pain in feet when lying flat []  hx of DVT []  hx of phlebitis [x]  swelling in legs []  varicose veins  Pulmonary: []  productive cough []  asthma []  wheezing  Neurologic: []  weakness in []  arms []  legs []  numbness in []  arms []  legs [] difficulty speaking or slurred speech []  temporary loss of vision in one eye []  dizziness  Hematologic: []  bleeding problems []  problems with blood clotting easily  GI []  vomiting blood []  blood in stool  GU: []  burning with urination []  blood in urine  Psychiatric: []  hx of major depression  Integumentary: []  rashes []  ulcers  Constitutional: []  fever []  chills   PHYSICAL EXAMINATION:  Filed Vitals:   10/27/13 1515  BP: 140/71  Pulse: 64  Resp: 18   Body mass index is 29.4 kg/(m^2).  General:  WDWN in NAD Gait: Normal HENT: WNL; normocephalic Eyes: PERRL Pulmonary: normal non-labored breathing , without Rales, rhonchi,  wheezing Cardiac: RRR, without  Murmurs, rubs or gallops; without carotid bruits Abdomen: soft, NT, no masses Skin: without rashes,  ulcers  Vascular Exam/Pulses: 2+ palpable radial pulses bilaterally Extremities: without ischemic changes, without Gangrene , without cellulitis; without open wounds;  Musculoskeletal: without muscle wasting or atrophy  Neurologic: A&O X 3; Appropriate Affect ; SENSATION: normal; MOTOR FUNCTION:  moving all extremities equally. Speech is fluent/normal   Non-Invasive Vascular Imaging: Carotid Duplex Scan:  10/27/2013  1.  Evidence of minimal (<40%) stenosis of the right ICA 2.  Patent left CEA with minimal hyperplasia observed at the proximal patch site 3.  Bilateral  vertebral is antegrade  ASSESSMENT: 57 y.o. male here for f/u carotid duplex scan.   PLAN: -pt is doing well from left CEA. -his incision has healed nicely -will have him f/u in 6 months with a carotid duplex and then yearly thereafter.  -he knows to contact us if he has any TIA/CVA sx or go to the ED. -pt  expresses understanding.   Doreatha MassedSamantha Caterin Tabares, PA-C [facility] [phone]  Clinic MD:   Pt seen and examined in conjunction with Dr. Arbie CookeyEarly  For VQI Use Only    PRE-ADM LIVING: [ x] Home, [ ]  Nursing home, [ ]  Homeless  AMB STATUS: [ x] Walking, [ ]  Walking w/ Assistance, [ ]  Wheelchair, [ ] Bed ridden  RECENT HEART ATTACK (<6 mon): No  CAD Sx: [x ] No, [ ]  Asx, h/o MI, [ ]  Stable angina, [ ]  Unstable angina  PRIOR CHF: [x ] No, [ ]  Asx, [ ]  Mild, [ ]  Moderate, [ ]  Severe  STRESS TEST: [ ] x No, [ ]  Normal, [ ]  + ischemia, [ ]  + MI, [ ]  Both

## 2014-05-17 ENCOUNTER — Encounter: Payer: Self-pay | Admitting: Family

## 2014-05-18 ENCOUNTER — Ambulatory Visit: Payer: Medicare Other | Admitting: Family

## 2014-05-18 ENCOUNTER — Other Ambulatory Visit (HOSPITAL_COMMUNITY): Payer: Medicare Other

## 2014-06-03 ENCOUNTER — Encounter (HOSPITAL_COMMUNITY): Payer: Self-pay | Admitting: Cardiology

## 2014-09-25 ENCOUNTER — Emergency Department
Admit: 2014-09-25 | Disposition: A | Discharge status: Home / Self Care | Payer: Self-pay | Admitting: Emergency Medicine

## 2014-10-15 ENCOUNTER — Ambulatory Visit: Payer: Self-pay | Admitting: Cardiovascular Disease

## 2015-07-02 ENCOUNTER — Inpatient Hospital Stay (HOSPITAL_COMMUNITY)
Admission: EM | Admit: 2015-07-02 | Discharge: 2015-07-04 | DRG: 641 | Disposition: A | Discharge status: Home / Self Care | Payer: Medicare Other | Attending: Internal Medicine | Admitting: Internal Medicine

## 2015-07-02 ENCOUNTER — Emergency Department (HOSPITAL_COMMUNITY): Payer: Medicare Other

## 2015-07-02 ENCOUNTER — Encounter (HOSPITAL_COMMUNITY): Payer: Self-pay | Admitting: Emergency Medicine

## 2015-07-02 DIAGNOSIS — G459 Transient cerebral ischemic attack, unspecified: Secondary | ICD-10-CM | POA: Diagnosis present

## 2015-07-02 DIAGNOSIS — I252 Old myocardial infarction: Secondary | ICD-10-CM

## 2015-07-02 DIAGNOSIS — I25810 Atherosclerosis of coronary artery bypass graft(s) without angina pectoris: Secondary | ICD-10-CM | POA: Diagnosis present

## 2015-07-02 DIAGNOSIS — I69354 Hemiplegia and hemiparesis following cerebral infarction affecting left non-dominant side: Secondary | ICD-10-CM

## 2015-07-02 DIAGNOSIS — IMO0002 Reserved for concepts with insufficient information to code with codable children: Secondary | ICD-10-CM | POA: Diagnosis present

## 2015-07-02 DIAGNOSIS — Z7982 Long term (current) use of aspirin: Secondary | ICD-10-CM

## 2015-07-02 DIAGNOSIS — I10 Essential (primary) hypertension: Secondary | ICD-10-CM | POA: Diagnosis present

## 2015-07-02 DIAGNOSIS — E785 Hyperlipidemia, unspecified: Secondary | ICD-10-CM | POA: Diagnosis present

## 2015-07-02 DIAGNOSIS — Z951 Presence of aortocoronary bypass graft: Secondary | ICD-10-CM

## 2015-07-02 DIAGNOSIS — Z955 Presence of coronary angioplasty implant and graft: Secondary | ICD-10-CM

## 2015-07-02 DIAGNOSIS — Z7984 Long term (current) use of oral hypoglycemic drugs: Secondary | ICD-10-CM

## 2015-07-02 DIAGNOSIS — A084 Viral intestinal infection, unspecified: Secondary | ICD-10-CM | POA: Diagnosis present

## 2015-07-02 DIAGNOSIS — Z833 Family history of diabetes mellitus: Secondary | ICD-10-CM

## 2015-07-02 DIAGNOSIS — E860 Dehydration: Principal | ICD-10-CM | POA: Diagnosis present

## 2015-07-02 DIAGNOSIS — E118 Type 2 diabetes mellitus with unspecified complications: Secondary | ICD-10-CM

## 2015-07-02 DIAGNOSIS — E1165 Type 2 diabetes mellitus with hyperglycemia: Secondary | ICD-10-CM | POA: Diagnosis present

## 2015-07-02 DIAGNOSIS — Z8249 Family history of ischemic heart disease and other diseases of the circulatory system: Secondary | ICD-10-CM

## 2015-07-02 DIAGNOSIS — Z794 Long term (current) use of insulin: Secondary | ICD-10-CM

## 2015-07-02 DIAGNOSIS — Z8673 Personal history of transient ischemic attack (TIA), and cerebral infarction without residual deficits: Secondary | ICD-10-CM

## 2015-07-02 DIAGNOSIS — E1140 Type 2 diabetes mellitus with diabetic neuropathy, unspecified: Secondary | ICD-10-CM | POA: Diagnosis present

## 2015-07-02 DIAGNOSIS — Z87891 Personal history of nicotine dependence: Secondary | ICD-10-CM

## 2015-07-02 DIAGNOSIS — Z7902 Long term (current) use of antithrombotics/antiplatelets: Secondary | ICD-10-CM

## 2015-07-02 DIAGNOSIS — K219 Gastro-esophageal reflux disease without esophagitis: Secondary | ICD-10-CM | POA: Diagnosis present

## 2015-07-02 LAB — I-STAT CHEM 8, ED
BUN: 19 mg/dL (ref 6–20)
Calcium, Ion: 1.12 mmol/L (ref 1.12–1.23)
Chloride: 103 mmol/L (ref 101–111)
Creatinine, Ser: 0.8 mg/dL (ref 0.61–1.24)
Glucose, Bld: 185 mg/dL — ABNORMAL HIGH (ref 65–99)
HCT: 44 % (ref 39.0–52.0)
Hemoglobin: 15 g/dL (ref 13.0–17.0)
Potassium: 3.4 mmol/L — ABNORMAL LOW (ref 3.5–5.1)
Sodium: 142 mmol/L (ref 135–145)
TCO2: 23 mmol/L (ref 0–100)

## 2015-07-02 LAB — COMPREHENSIVE METABOLIC PANEL
ALBUMIN: 3.8 g/dL (ref 3.5–5.0)
ALK PHOS: 79 U/L (ref 38–126)
ALT: 24 U/L (ref 17–63)
AST: 29 U/L (ref 15–41)
Anion gap: 10 (ref 5–15)
BILIRUBIN TOTAL: 0.6 mg/dL (ref 0.3–1.2)
BUN: 18 mg/dL (ref 6–20)
CO2: 24 mmol/L (ref 22–32)
Calcium: 9 mg/dL (ref 8.9–10.3)
Chloride: 105 mmol/L (ref 101–111)
Creatinine, Ser: 0.97 mg/dL (ref 0.61–1.24)
GFR calc non Af Amer: >60 mL/min (ref >60)
GLUCOSE: 187 mg/dL — AB (ref 65–99)
Potassium: 3.5 mmol/L (ref 3.5–5.1)
SODIUM: 139 mmol/L (ref 135–145)
Total Protein: 7.2 g/dL (ref 6.5–8.1)

## 2015-07-02 LAB — I-STAT CG4 LACTIC ACID, ED: Lactic Acid, Venous: 2.65 mmol/L (ref 0.5–2.0)

## 2015-07-02 LAB — CBC
HCT: 39.2 % (ref 39.0–52.0)
Hemoglobin: 13.6 g/dL (ref 13.0–17.0)
MCH: 28.6 pg (ref 26.0–34.0)
MCHC: 34.7 g/dL (ref 30.0–36.0)
MCV: 82.5 fL (ref 78.0–100.0)
Platelets: 251 K/uL (ref 150–400)
RBC: 4.75 MIL/uL (ref 4.22–5.81)
RDW: 13.2 % (ref 11.5–15.5)
WBC: 7.9 K/uL (ref 4.0–10.5)

## 2015-07-02 LAB — APTT: aPTT: 26 seconds (ref 24–37)

## 2015-07-02 LAB — I-STAT TROPONIN, ED: Troponin i, poc: 0 ng/mL (ref 0.00–0.08)

## 2015-07-02 LAB — CBG MONITORING, ED: Glucose-Capillary: 185 mg/dL — ABNORMAL HIGH (ref 65–99)

## 2015-07-02 LAB — PROTIME-INR
INR: 1.04 (ref 0.00–1.49)
PROTHROMBIN TIME: 13.8 s (ref 11.6–15.2)

## 2015-07-02 LAB — DIFFERENTIAL
Basophils Absolute: 0 10*3/uL (ref 0.0–0.1)
Basophils Relative: 0 %
Eosinophils Absolute: 0.1 10*3/uL (ref 0.0–0.7)
Eosinophils Relative: 1 %
LYMPHS ABS: 1.4 10*3/uL (ref 0.7–4.0)
LYMPHS PCT: 17 %
MONO ABS: 0.5 10*3/uL (ref 0.1–1.0)
Monocytes Relative: 7 %
NEUTROS ABS: 5.9 10*3/uL (ref 1.7–7.7)
Neutrophils Relative %: 75 %

## 2015-07-02 NOTE — ED Notes (Signed)
Upon further assessment, pt now reporting general S/S of fatigue, weakness, abd pain, nausea, vomiting.

## 2015-07-02 NOTE — ED Notes (Signed)
Pt in EMS from home reporting L sided numbness, tingling, and HA. Started 2030, symptoms resolved en route. Hx stroke, TIA. On plavix. L leg weakness from previous stroke. A/OX4

## 2015-07-02 NOTE — ED Notes (Signed)
Pt to CT

## 2015-07-02 NOTE — ED Notes (Signed)
EDP at bedside  

## 2015-07-02 NOTE — ED Notes (Signed)
Dr Rhunette CroftNanavati given a copy of lactic acid results 2.65

## 2015-07-03 ENCOUNTER — Inpatient Hospital Stay (HOSPITAL_COMMUNITY): Payer: Medicare Other

## 2015-07-03 DIAGNOSIS — Z951 Presence of aortocoronary bypass graft: Secondary | ICD-10-CM | POA: Diagnosis not present

## 2015-07-03 DIAGNOSIS — I69354 Hemiplegia and hemiparesis following cerebral infarction affecting left non-dominant side: Secondary | ICD-10-CM | POA: Diagnosis not present

## 2015-07-03 DIAGNOSIS — K529 Noninfective gastroenteritis and colitis, unspecified: Secondary | ICD-10-CM

## 2015-07-03 DIAGNOSIS — E860 Dehydration: Secondary | ICD-10-CM | POA: Diagnosis present

## 2015-07-03 DIAGNOSIS — Z87891 Personal history of nicotine dependence: Secondary | ICD-10-CM | POA: Diagnosis not present

## 2015-07-03 DIAGNOSIS — I6990 Unspecified sequelae of unspecified cerebrovascular disease: Secondary | ICD-10-CM

## 2015-07-03 DIAGNOSIS — G459 Transient cerebral ischemic attack, unspecified: Secondary | ICD-10-CM

## 2015-07-03 DIAGNOSIS — I252 Old myocardial infarction: Secondary | ICD-10-CM | POA: Diagnosis not present

## 2015-07-03 DIAGNOSIS — Z7984 Long term (current) use of oral hypoglycemic drugs: Secondary | ICD-10-CM | POA: Diagnosis not present

## 2015-07-03 DIAGNOSIS — R112 Nausea with vomiting, unspecified: Secondary | ICD-10-CM | POA: Diagnosis not present

## 2015-07-03 DIAGNOSIS — E1165 Type 2 diabetes mellitus with hyperglycemia: Secondary | ICD-10-CM | POA: Diagnosis present

## 2015-07-03 DIAGNOSIS — Z794 Long term (current) use of insulin: Secondary | ICD-10-CM | POA: Diagnosis not present

## 2015-07-03 DIAGNOSIS — A084 Viral intestinal infection, unspecified: Secondary | ICD-10-CM | POA: Diagnosis present

## 2015-07-03 DIAGNOSIS — I10 Essential (primary) hypertension: Secondary | ICD-10-CM

## 2015-07-03 DIAGNOSIS — Z7902 Long term (current) use of antithrombotics/antiplatelets: Secondary | ICD-10-CM | POA: Diagnosis not present

## 2015-07-03 DIAGNOSIS — Z8673 Personal history of transient ischemic attack (TIA), and cerebral infarction without residual deficits: Secondary | ICD-10-CM | POA: Diagnosis not present

## 2015-07-03 DIAGNOSIS — K219 Gastro-esophageal reflux disease without esophagitis: Secondary | ICD-10-CM | POA: Diagnosis present

## 2015-07-03 DIAGNOSIS — E119 Type 2 diabetes mellitus without complications: Secondary | ICD-10-CM

## 2015-07-03 DIAGNOSIS — Z833 Family history of diabetes mellitus: Secondary | ICD-10-CM | POA: Diagnosis not present

## 2015-07-03 DIAGNOSIS — R208 Other disturbances of skin sensation: Secondary | ICD-10-CM | POA: Diagnosis not present

## 2015-07-03 DIAGNOSIS — I25810 Atherosclerosis of coronary artery bypass graft(s) without angina pectoris: Secondary | ICD-10-CM | POA: Diagnosis present

## 2015-07-03 DIAGNOSIS — R202 Paresthesia of skin: Secondary | ICD-10-CM | POA: Diagnosis not present

## 2015-07-03 DIAGNOSIS — E785 Hyperlipidemia, unspecified: Secondary | ICD-10-CM | POA: Diagnosis present

## 2015-07-03 DIAGNOSIS — Z8249 Family history of ischemic heart disease and other diseases of the circulatory system: Secondary | ICD-10-CM | POA: Diagnosis not present

## 2015-07-03 DIAGNOSIS — E1140 Type 2 diabetes mellitus with diabetic neuropathy, unspecified: Secondary | ICD-10-CM | POA: Diagnosis present

## 2015-07-03 DIAGNOSIS — Z955 Presence of coronary angioplasty implant and graft: Secondary | ICD-10-CM | POA: Diagnosis not present

## 2015-07-03 DIAGNOSIS — Z7982 Long term (current) use of aspirin: Secondary | ICD-10-CM | POA: Diagnosis not present

## 2015-07-03 LAB — COMPREHENSIVE METABOLIC PANEL
ALBUMIN: 3.8 g/dL (ref 3.5–5.0)
ALK PHOS: 82 U/L (ref 38–126)
ALT: 23 U/L (ref 17–63)
ANION GAP: 11 (ref 5–15)
AST: 27 U/L (ref 15–41)
BILIRUBIN TOTAL: 0.6 mg/dL (ref 0.3–1.2)
BUN: 17 mg/dL (ref 6–20)
CALCIUM: 9.1 mg/dL (ref 8.9–10.3)
CO2: 24 mmol/L (ref 22–32)
CREATININE: 0.92 mg/dL (ref 0.61–1.24)
Chloride: 105 mmol/L (ref 101–111)
GFR calc Af Amer: >60 mL/min (ref >60)
GFR calc non Af Amer: >60 mL/min (ref >60)
GLUCOSE: 132 mg/dL — AB (ref 65–99)
Potassium: 3.5 mmol/L (ref 3.5–5.1)
Sodium: 140 mmol/L (ref 135–145)
TOTAL PROTEIN: 7.6 g/dL (ref 6.5–8.1)

## 2015-07-03 LAB — TROPONIN I: Troponin I: <0.03 ng/mL (ref <0.031)

## 2015-07-03 LAB — TSH: TSH: 2.22 u[IU]/mL (ref 0.350–4.500)

## 2015-07-03 LAB — CBG MONITORING, ED: Glucose-Capillary: 74 mg/dL (ref 65–99)

## 2015-07-03 LAB — CBC
HCT: 39.7 % (ref 39.0–52.0)
HEMOGLOBIN: 13.6 g/dL (ref 13.0–17.0)
MCH: 28.3 pg (ref 26.0–34.0)
MCHC: 34.3 g/dL (ref 30.0–36.0)
MCV: 82.7 fL (ref 78.0–100.0)
Platelets: 260 10*3/uL (ref 150–400)
RBC: 4.8 MIL/uL (ref 4.22–5.81)
RDW: 13.4 % (ref 11.5–15.5)
WBC: 7.8 10*3/uL (ref 4.0–10.5)

## 2015-07-03 LAB — LIPID PANEL
CHOLESTEROL: 140 mg/dL (ref 0–200)
HDL: 33 mg/dL — ABNORMAL LOW (ref >40)
LDL CALC: 97 mg/dL (ref 0–99)
Total CHOL/HDL Ratio: 4.2 RATIO
Triglycerides: 50 mg/dL (ref <150)
VLDL: 10 mg/dL (ref 0–40)

## 2015-07-03 LAB — C DIFFICILE QUICK SCREEN W PCR REFLEX
C DIFFICILE (CDIFF) INTERP: NEGATIVE
C DIFFICILE (CDIFF) TOXIN: NEGATIVE
C DIFFICLE (CDIFF) ANTIGEN: NEGATIVE

## 2015-07-03 LAB — I-STAT CG4 LACTIC ACID, ED: Lactic Acid, Venous: 1.75 mmol/L (ref 0.5–2.0)

## 2015-07-03 LAB — GLUCOSE, CAPILLARY: Glucose-Capillary: 139 mg/dL — ABNORMAL HIGH (ref 65–99)

## 2015-07-03 MED ORDER — ENOXAPARIN SODIUM 40 MG/0.4ML ~~LOC~~ SOLN
40.0000 mg | Freq: Every day | SUBCUTANEOUS | Status: DC
  Administered 2015-07-03 – 2015-07-04 (×2): 40 mg via SUBCUTANEOUS
  Filled 2015-07-03 (×3): qty 0.4

## 2015-07-03 MED ORDER — ASPIRIN 325 MG PO TABS
325.0000 mg | ORAL_TABLET | Freq: Every day | ORAL | Status: DC
  Administered 2015-07-03 – 2015-07-04 (×3): 325 mg via ORAL
  Filled 2015-07-03 (×3): qty 1

## 2015-07-03 MED ORDER — ASPIRIN EC 81 MG PO TBEC: 81.0000 mg | DELAYED_RELEASE_TABLET | Freq: Every day | ORAL | Status: DC

## 2015-07-03 MED ORDER — INSULIN ASPART 100 UNIT/ML ~~LOC~~ SOLN: 0.0000 [IU] | Freq: Every day | SUBCUTANEOUS | Status: DC

## 2015-07-03 MED ORDER — INSULIN ASPART 100 UNIT/ML ~~LOC~~ SOLN
10.0000 [IU] | Freq: Three times a day (TID) | SUBCUTANEOUS | Status: DC
  Administered 2015-07-03 – 2015-07-04 (×2): 10 [IU] via SUBCUTANEOUS

## 2015-07-03 MED ORDER — LIDOCAINE HCL 2 % IJ SOLN: 20.0000 mL | Freq: Once | INTRAMUSCULAR | Status: DC

## 2015-07-03 MED ORDER — PANTOPRAZOLE SODIUM 40 MG PO TBEC
40.0000 mg | DELAYED_RELEASE_TABLET | Freq: Every day | ORAL | Status: DC
  Administered 2015-07-03 – 2015-07-04 (×2): 40 mg via ORAL
  Filled 2015-07-03 (×2): qty 1

## 2015-07-03 MED ORDER — ONDANSETRON HCL 4 MG PO TABS: 4.0000 mg | ORAL_TABLET | Freq: Four times a day (QID) | ORAL | Status: DC | PRN

## 2015-07-03 MED ORDER — INSULIN ASPART 100 UNIT/ML ~~LOC~~ SOLN
0.0000 [IU] | Freq: Three times a day (TID) | SUBCUTANEOUS | Status: DC
Start: 2015-07-03 — End: 2015-07-04
  Administered 2015-07-03 – 2015-07-04 (×2): 1 [IU] via SUBCUTANEOUS

## 2015-07-03 MED ORDER — INSULIN GLARGINE 100 UNIT/ML ~~LOC~~ SOLN
45.0000 [IU] | Freq: Every day | SUBCUTANEOUS | Status: DC
Start: 2015-07-03 — End: 2015-07-04
  Administered 2015-07-03 – 2015-07-04 (×2): 45 [IU] via SUBCUTANEOUS
  Filled 2015-07-03 (×3): qty 0.45

## 2015-07-03 MED ORDER — SODIUM CHLORIDE 0.9 % IJ SOLN
3.0000 mL | Freq: Two times a day (BID) | INTRAMUSCULAR | Status: DC
  Administered 2015-07-04: 3 mL via INTRAVENOUS

## 2015-07-03 MED ORDER — SODIUM CHLORIDE 0.9 % IV SOLN: INTRAVENOUS | Status: AC

## 2015-07-03 MED ORDER — ATORVASTATIN CALCIUM 80 MG PO TABS: 80.0000 mg | ORAL_TABLET | Freq: Every day | ORAL | Status: DC

## 2015-07-03 MED ORDER — ONDANSETRON HCL 4 MG/2ML IJ SOLN: 4.0000 mg | Freq: Four times a day (QID) | INTRAMUSCULAR | Status: DC | PRN

## 2015-07-03 MED ORDER — POTASSIUM CHLORIDE CRYS ER 20 MEQ PO TBCR
40.0000 meq | EXTENDED_RELEASE_TABLET | Freq: Once | ORAL | Status: AC
  Administered 2015-07-03: 40 meq via ORAL
  Filled 2015-07-03: qty 2

## 2015-07-03 MED ORDER — LORAZEPAM 2 MG/ML IJ SOLN
1.0000 mg | Freq: Once | INTRAMUSCULAR | Status: AC | PRN
  Administered 2015-07-03: 1 mg via INTRAVENOUS
  Filled 2015-07-03: qty 1

## 2015-07-03 MED ORDER — CLOPIDOGREL BISULFATE 75 MG PO TABS
75.0000 mg | ORAL_TABLET | Freq: Every day | ORAL | Status: DC
  Administered 2015-07-03 – 2015-07-04 (×2): 75 mg via ORAL
  Filled 2015-07-03 (×2): qty 1

## 2015-07-03 NOTE — Progress Notes (Signed)
Subjective:  Patient seen and examined at bedside in the ER as he was not transferred yet to the floor.  Patient says he is doing well. His numbness/weakness has resolved in the left face and arm, though he still feels different sensation on left face and left arm and legs. 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   He says he came in because yesterday, he had some numbness on the left face and arm that lasted about 30 minutes and it gradually resolved before coming to the ER.  His last TIA was few years ago. He reports compliance with both aspirin and plavix.   He reports that he had one episode of bloody vomiting 2 days ago, which was frank blood,  he has not had it since then. This is his first episode.He denies history of alcohol use. He denies smoking.   He reports having intermittent loose watery nonbloody diarrhea for the past 2 weeks 3-4 times per day. He says he has abdominal pain which worsens few hours after he eats. He says his last bowel movement was this morning and it was more formed. He was taking a PPI until 2 weeks ago and his diarrhea happened after that. He denies any antibiotic use in the last 3 months.   Objective: Vital signs in last 24 hours: Filed Vitals:   07/03/15 0500 07/03/15 0530 07/03/15 0701 07/03/15 0800  BP: 136/80 110/66 144/82 141/78  Pulse: 81 73 89 70  Temp:      TempSrc:      Resp: 20 18 22 21   Height:      Weight:      SpO2: 100% 97% 97% 97%   Weight change:  No intake or output data in the 24 hours ending 07/03/15 1325  General: Vital signs reviewed. Patient in no acute distress HEENT: some dry mucous membranes, ncat, EOMI, PERRLA Cardiovascular: regular rate, rhythm, no murmur appreciated  Pulmonary/Chest: Clear to auscultation bilaterally, no wheezes, rales, or rhonchi. Abdominal: Soft, +BS, tender to palp in lower abd,  Extremities: No lower extremity edema bilaterally, pulses symmetric  and intact bilaterally. Skin: Warm, dry and intact. No rashes or erythema. Neurologic exam: MS: A&O  CN II-XII grossly intact Sensory: intact to light touch- feels different on left face and arm Motor: 5/5 strength in upper, 5/5 in lower extremities, normal muscle tone Cerebellar: romberg negative, normal FNF Psyc: pleasant personality, affect appropriate to mood, no focal deficits      Lab Results: Results for orders placed or performed during the hospital encounter of 07/02/15 (from the past 24 hour(s))  Protime-INR     Status: None   Collection Time: 07/02/15 11:12 PM  Result Value Ref Range   Prothrombin Time 13.8 11.6 - 15.2 seconds   INR 1.04 0.00 - 1.49  APTT     Status: None   Collection Time: 07/02/15 11:12 PM  Result Value Ref Range   aPTT 26 24 - 37 seconds  CBC     Status: None   Collection Time: 07/02/15 11:12 PM  Result Value Ref Range   WBC 7.9 4.0 - 10.5 K/uL   RBC 4.75 4.22 - 5.81 MIL/uL   Hemoglobin 13.6 13.0 - 17.0 g/dL   HCT 16.1 09.6 - 04.5 %   MCV 82.5 78.0 - 100.0 fL   MCH 28.6 26.0 - 34.0 pg   MCHC 34.7 30.0 - 36.0 g/dL   RDW 40.9 81.1 - 91.4 %  Platelets 251 150 - 400 K/uL  Differential     Status: None   Collection Time: 07/02/15 11:12 PM  Result Value Ref Range   Neutrophils Relative % 75 %   Neutro Abs 5.9 1.7 - 7.7 K/uL   Lymphocytes Relative 17 %   Lymphs Abs 1.4 0.7 - 4.0 K/uL   Monocytes Relative 7 %   Monocytes Absolute 0.5 0.1 - 1.0 K/uL   Eosinophils Relative 1 %   Eosinophils Absolute 0.1 0.0 - 0.7 K/uL   Basophils Relative 0 %   Basophils Absolute 0.0 0.0 - 0.1 K/uL  Comprehensive metabolic panel     Status: Abnormal   Collection Time: 07/02/15 11:12 PM  Result Value Ref Range   Sodium 139 135 - 145 mmol/L   Potassium 3.5 3.5 - 5.1 mmol/L   Chloride 105 101 - 111 mmol/L   CO2 24 22 - 32 mmol/L   Glucose, Bld 187 (H) 65 - 99 mg/dL   BUN 18 6 - 20 mg/dL   Creatinine, Ser 1.61 0.61 - 1.24 mg/dL   Calcium 9.0 8.9 - 09.6  mg/dL   Total Protein 7.2 6.5 - 8.1 g/dL   Albumin 3.8 3.5 - 5.0 g/dL   AST 29 15 - 41 U/L   ALT 24 17 - 63 U/L   Alkaline Phosphatase 79 38 - 126 U/L   Total Bilirubin 0.6 0.3 - 1.2 mg/dL   GFR calc non Af Amer >60 >60 mL/min   GFR calc Af Amer >60 >60 mL/min   Anion gap 10 5 - 15  I-stat troponin, ED (not at [facility], [facility])     Status: None   Collection Time: 07/02/15 11:16 PM  Result Value Ref Range   Troponin i, poc 0.00 0.00 - 0.08 ng/mL   Comment 3          I-Stat Chem 8, ED  (not at [facility], [facility])     Status: Abnormal   Collection Time: 07/02/15 11:19 PM  Result Value Ref Range   Sodium 142 135 - 145 mmol/L   Potassium 3.4 (L) 3.5 - 5.1 mmol/L   Chloride 103 101 - 111 mmol/L   BUN 19 6 - 20 mg/dL   Creatinine, Ser 0.45 0.61 - 1.24 mg/dL   Glucose, Bld 409 (H) 65 - 99 mg/dL   Calcium, Ion 8.11 9.14 - 1.23 mmol/L   TCO2 23 0 - 100 mmol/L   Hemoglobin 15.0 13.0 - 17.0 g/dL   HCT 78.2 95.6 - 21.3 %  I-Stat CG4 Lactic Acid, ED     Status: Abnormal   Collection Time: 07/02/15 11:20 PM  Result Value Ref Range   Lactic Acid, Venous 2.65 (HH) 0.5 - 2.0 mmol/L   Comment NOTIFIED PHYSICIAN   CBG monitoring, ED     Status: Abnormal   Collection Time: 07/02/15 11:28 PM  Result Value Ref Range   Glucose-Capillary 185 (H) 65 - 99 mg/dL  I-Stat CG4 Lactic Acid, ED     Status: None   Collection Time: 07/03/15  1:53 AM  Result Value Ref Range   Lactic Acid, Venous 1.75 0.5 - 2.0 mmol/L  TSH     Status: None   Collection Time: 07/03/15  3:00 AM  Result Value Ref Range   TSH 2.220 0.350 - 4.500 uIU/mL  Troponin I     Status: None   Collection Time: 07/03/15  3:00 AM  Result Value Ref Range   Troponin I <0.03 <0.031 ng/mL  Comprehensive metabolic panel  Status: Abnormal   Collection Time: 07/03/15  3:00 AM  Result Value Ref Range   Sodium 140 135 - 145 mmol/L   Potassium 3.5 3.5 - 5.1 mmol/L   Chloride 105 101 - 111 mmol/L   CO2 24 22 - 32 mmol/L   Glucose, Bld 132 (H) 65 - 99  mg/dL   BUN 17 6 - 20 mg/dL   Creatinine, Ser 8.11 0.61 - 1.24 mg/dL   Calcium 9.1 8.9 - 91.4 mg/dL   Total Protein 7.6 6.5 - 8.1 g/dL   Albumin 3.8 3.5 - 5.0 g/dL   AST 27 15 - 41 U/L   ALT 23 17 - 63 U/L   Alkaline Phosphatase 82 38 - 126 U/L   Total Bilirubin 0.6 0.3 - 1.2 mg/dL   GFR calc non Af Amer >60 >60 mL/min   GFR calc Af Amer >60 >60 mL/min   Anion gap 11 5 - 15  CBC     Status: None   Collection Time: 07/03/15  3:00 AM  Result Value Ref Range   WBC 7.8 4.0 - 10.5 K/uL   RBC 4.80 4.22 - 5.81 MIL/uL   Hemoglobin 13.6 13.0 - 17.0 g/dL   HCT 78.2 95.6 - 21.3 %   MCV 82.7 78.0 - 100.0 fL   MCH 28.3 26.0 - 34.0 pg   MCHC 34.3 30.0 - 36.0 g/dL   RDW 08.6 57.8 - 46.9 %   Platelets 260 150 - 400 K/uL  Lipid panel     Status: Abnormal   Collection Time: 07/03/15  3:00 AM  Result Value Ref Range   Cholesterol 140 0 - 200 mg/dL   Triglycerides 50 <629 mg/dL   HDL 33 (L) >52 mg/dL   Total CHOL/HDL Ratio 4.2 RATIO   VLDL 10 0 - 40 mg/dL   LDL Cholesterol 97 0 - 99 mg/dL  CBG monitoring, ED     Status: None   Collection Time: 07/03/15  7:45 AM  Result Value Ref Range   Glucose-Capillary 74 65 - 99 mg/dL    Micro Results: No results found for this or any previous visit (from the past 240 hour(s)). Studies/Results: Ct Head Wo Contrast  07/02/2015  CLINICAL DATA:  Left-sided numbness and weakness for 4 hours. EXAM: CT HEAD WITHOUT CONTRAST TECHNIQUE: Contiguous axial images were obtained from the base of the skull through the vertex without intravenous contrast. COMPARISON:  09/25/2014 FINDINGS: Ventricles normal in size and configuration. There are no parenchymal masses or mass effect, no evidence of an infarct, no extra-axial masses or abnormal fluid collections and no intracranial hemorrhage. Moderate mucosal thickening lines the maxillary sinuses with mild ethmoid sinus mucosal thickening. Clear mastoid air cells. IMPRESSION: 1. No intracranial abnormality. 2. Sinus disease  as described. Electronically Signed   By: Amie Portland M.D.   On: 07/02/2015 23:49   Mr [name] Wo Contrast  07/03/2015  CLINICAL DATA:  TIA. Episode of sudden onset left facial and arm numbness as well as nausea, vomiting, and diaphoresis lasting 20-30 minutes. Baseline left leg weakness from a prior stroke. Left carotid endarterectomy in 03/2013. EXAM: MRI HEAD WITHOUT CONTRAST MRA HEAD WITHOUT CONTRAST TECHNIQUE: Multiplanar, multiecho pulse sequences of the brain and surrounding structures were obtained without intravenous contrast. Angiographic images of the head were obtained using MRA technique without contrast. COMPARISON:  Head CT 07/02/2015. Head and neck CTA 03/27/2013. Head MRI/ MRA 03/26/2013. FINDINGS: MRI HEAD FINDINGS There is mildly increased signal in the posterior limb of the  left internal capsule on axial diffusion images. The majority of this signal does not appear significantly changed from a 10/03/2010 brain MRI. A punctate focus of signal abnormality at the more anterior aspect of this region was not present in 2012 the was present on the more recent 2014 study. No clearly restricted diffusion is confirmed on the coronal sequence. This would also not explain left-sided numbness and therefore is not felt to reflect an acute infarct. No restricted diffusion is identified in the right cerebral hemisphere, brainstem, or cerebellum. There is no evidence of intracranial hemorrhage, mass, midline shift, or extra-axial fluid collection. There is mild generalized cerebral atrophy. A chronic infarct is again seen in the right paramedian pons. Orbits are unremarkable. Moderate left greater than right maxillary sinus mucosal thickening and minimal bilateral ethmoid air cell mucosal thickening are noted. The mastoid air cells are clear. Major intracranial vascular flow voids are preserved. MRA HEAD FINDINGS The visualized distal vertebral arteries are patent. Irregularity and mild narrowing of the  right V4 segment is unchanged. The left PICA origin is patent. Right PICA is not well evaluated. Right AICA and bilateral SCA origins are patent. The basilar artery is patent with moderate stenosis proximally, unchanged. There is a fetal type origin of the right PCA with hypoplastic P1 segment. A left posterior communicating artery is not clearly identified. PCAs are patent without significant proximal stenosis. The internal carotid arteries are patent from skullbase to carotid termini, with improved flow related enhancement of the left ICA following interval endarterectomy. There is evidence of mild cavernous ICA narrowing bilaterally without definite interval change. ACAs and MCAs are patent with mild branch vessel irregularity but no evidence of significant proximal stenosis or major branch vessel occlusion. No intracranial aneurysm is identified. IMPRESSION: 1. No definite acute infarct. Mild diffusion signal abnormality in the left internal capsule largely appears chronic and would not explain the left-sided numbness. 2. Chronic pontine infarct. 3. No large vessel occlusion. Mild bilateral cavernous ICA stenosis. 4. Moderate proximal basilar artery stenosis, unchanged. Electronically Signed   By: Sebastian Ache M.D.   On: 07/03/2015 07:52   Mr Brain Wo Contrast  07/03/2015  CLINICAL DATA:  TIA. Episode of sudden onset left facial and arm numbness as well as nausea, vomiting, and diaphoresis lasting 20-30 minutes. Baseline left leg weakness from a prior stroke. Left carotid endarterectomy in 03/2013. EXAM: MRI HEAD WITHOUT CONTRAST MRA HEAD WITHOUT CONTRAST TECHNIQUE: Multiplanar, multiecho pulse sequences of the brain and surrounding structures were obtained without intravenous contrast. Angiographic images of the head were obtained using MRA technique without contrast. COMPARISON:  Head CT 07/02/2015. Head and neck CTA 03/27/2013. Head MRI/ MRA 03/26/2013. FINDINGS: MRI HEAD FINDINGS There is mildly increased  signal in the posterior limb of the left internal capsule on axial diffusion images. The majority of this signal does not appear significantly changed from a 10/03/2010 brain MRI. A punctate focus of signal abnormality at the more anterior aspect of this region was not present in 2012 the was present on the more recent 2014 study. No clearly restricted diffusion is confirmed on the coronal sequence. This would also not explain left-sided numbness and therefore is not felt to reflect an acute infarct. No restricted diffusion is identified in the right cerebral hemisphere, brainstem, or cerebellum. There is no evidence of intracranial hemorrhage, mass, midline shift, or extra-axial fluid collection. There is mild generalized cerebral atrophy. A chronic infarct is again seen in the right paramedian pons. Orbits are unremarkable. Moderate left greater  than right maxillary sinus mucosal thickening and minimal bilateral ethmoid air cell mucosal thickening are noted. The mastoid air cells are clear. Major intracranial vascular flow voids are preserved. MRA HEAD FINDINGS The visualized distal vertebral arteries are patent. Irregularity and mild narrowing of the right V4 segment is unchanged. The left PICA origin is patent. Right PICA is not well evaluated. Right AICA and bilateral SCA origins are patent. The basilar artery is patent with moderate stenosis proximally, unchanged. There is a fetal type origin of the right PCA with hypoplastic P1 segment. A left posterior communicating artery is not clearly identified. PCAs are patent without significant proximal stenosis. The internal carotid arteries are patent from skullbase to carotid termini, with improved flow related enhancement of the left ICA following interval endarterectomy. There is evidence of mild cavernous ICA narrowing bilaterally without definite interval change. ACAs and MCAs are patent with mild branch vessel irregularity but no evidence of significant  proximal stenosis or major branch vessel occlusion. No intracranial aneurysm is identified. IMPRESSION: 1. No definite acute infarct. Mild diffusion signal abnormality in the left internal capsule largely appears chronic and would not explain the left-sided numbness. 2. Chronic pontine infarct. 3. No large vessel occlusion. Mild bilateral cavernous ICA stenosis. 4. Moderate proximal basilar artery stenosis, unchanged. Electronically Signed   By: Sebastian Ache M.D.   On: 07/03/2015 07:52   Medications: I have reviewed the patient's current medications. Scheduled Meds: . aspirin  325 mg Oral Daily  . [START ON 07/04/2015] atorvastatin  80 mg Oral QHS  . clopidogrel  75 mg Oral Daily  . enoxaparin (LOVENOX) injection  40 mg Subcutaneous Daily  . insulin aspart  0-5 Units Subcutaneous QHS  . insulin aspart  0-9 Units Subcutaneous TID WC  . insulin aspart  10 Units Subcutaneous TID AC  . insulin glargine  45 Units Subcutaneous Daily  . pantoprazole  40 mg Oral Daily  . sodium chloride  3 mL Intravenous Q12H   Continuous Infusions: . sodium chloride     PRN Meds:.ondansetron **OR** ondansetron (ZOFRAN) IV Assessment/Plan: Principal Problem:   TIA (transient ischemic attack) Active Problems:   HTN (hypertension)   Hyperlipidemia   CAD (coronary artery disease) of artery bypass graft   H/O: CVA (cerebrovascular accident)   Diabetes mellitus type 2, uncontrolled, with complications (HCC)  TIA: Patient presents with resolved episode of left sided numbness lasting 30 minutes. He has a history of HTN, HLD, DMII, CAD, and CVA, all raising his risk of repeat TIA/CVA. BP currently controlled, but last A1c in our system was 9.4% in 2014. He denies tobacco use. He is currently on ASA and Plavix and reports adherence. Ordered stroke workup . His MRI/MRA shows no definite acute infarct but chronic pontine infarcts, and no large vessel occlusion. His troponins are WNL, and TSH normal.  Lipid panel was  WNL. His echo was entirely unremarkable. His 2014 carotid dopplers show 80-99% left ICA stenosis but normal right carotid artery.  -Consulted neurology- appreciate their recs -a1c f/u -f/u carotid doppler from today -continue aspirin and Plavix  -continue atorvastatin -holding losartan due to low-normal bp - PT/OT   N/V/Diarrhea: Patient reports 2 weeks of chronic N/V and diarrhea, stating symptoms started after running out of anti-reflux medication. Etiology unclear, but unlikely to be GERD alone. Differential remains broad. Will initiate workup with C diff, HIV, and TSH. Patient has had recent colonoscopy, but given N/V and new blood in vomit, EGD may be warranted. His TSH is normal.  This morning on exam, he looked a little volume depleted.  -monitor overnight for any recurrent hematemesis  -NS 125cc/hr for 10 hours  -trend CBC - -f/u HIV -f/u cdiff -protonix daily    DMII: No recent A1c. Home regimen is Novolog 20 U qAC and Lantus 60 U qAM. Blood sugars are stable  -f/u a1c - SSI - Novolog 10 U tid with meals and Lantus 45 U qAM  HTN: Well controlled.  - HOLD Losartan due to volume depletion and soft BP dueto history of n/v/d for 2 weeks  HLD: Atorvastatin 80  GERD: Protonix  Dispo: Disposition is deferred at this time, awaiting improvement of current medical problems.  Anticipated discharge in approximately 1 day(s).   The patient does have a current PCP (Harriett Pietro CassisP Burns, MD) and [facility].  The patient does not have transportation limitations that hinder transportation to clinic appointments.  .Services Needed at time of discharge: Y = Yes, Blank = No PT:   OT:   RN:   Equipment:   Other:     LOS: 0 days   Deneise LeverParth Fabianna Keats, MD 07/03/2015, 1:25 PM

## 2015-07-03 NOTE — Consult Note (Addendum)
Requesting Physician: Dr.  Johnny Bridge    Reason fo consultation:  Evaluate left-sided numbness  HPI:                                                                                                                                         Shawn Hood is an 60 y.o. male patient who presented with worsening left-sided numbness since yesterday. 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 His been having diarrhea for the past few days with some dehydration secondary to it. He noticed worsening left-sided numbness the left face and arm. Otherwise no other new neurological symptoms involving vision speech or any motor weakness. He presented to the ER for acute stroke evaluation since his previous stroke symptoms started with numbness similar to this.  MRI of the brain completed during this hospitalization which did not reveal any acute stroke   Past Medical History  Diagnosis Date  . Diabetes mellitus type 2, uncontrolled, with complications (HCC) DX: Age 52  . Hypertension   . Coronary artery disease     [facility]. s/p 3-vessel CABG 2002. b NSTEMI 09/2010 with patent grafts, no obvious culprit, for med rx.  c. DES to SVG-OM, severe diffuse disease per [facility].  c.  DES to SVG to OM  9/13  . Hyperlipidemia   . 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  . Sinus bradycardia   . Myocardial infarction (HCC) 2002  . Shortness of breath   . Stroke [facility]) 2006 ?    left arm & leg    Past Surgical History  Procedure Laterality Date  . Coronary artery bypass graft  2002  . Shoulder surgery Left   . Cholecystectomy    . Coronary angioplasty with stent placement  2013  . Endarterectomy Left 03/30/2013    Procedure: ENDARTERECTOMY CAROTID;  Surgeon: Larina Earthly, MD;  Location: [facility] OR;  Service: Vascular;  Laterality: Left;  . Left heart catheterization with coronary/graft angiogram N/A 02/29/2012    Procedure:  LEFT HEART CATHETERIZATION WITH Isabel Caprice;  Surgeon: [name], MD;  Location: [facility] CATH LAB;  Service: Cardiovascular;  Laterality: N/A;  . Percutaneous coronary stent intervention (pci-s)  02/29/2012    Procedure: PERCUTANEOUS CORONARY STENT INTERVENTION (PCI-S);  Surgeon: [name], MD;  Location: [facility] CATH LAB;  Service: Cardiovascular;;    Family History  Problem Relation Age of Onset  . Coronary artery disease Mother     Died at 62 of MI  . Coronary artery disease Brother     Died of CAD but had other medical problems  . Diabetes Mother   . Hypertension Mother   . Diabetes Sister   . Diabetes Brother   . Hypertension Brother     Social History:  reports that he quit smoking  about 24 years ago. His smoking use included Cigarettes. He has a 15 pack-year smoking history. He has never used smokeless tobacco. He reports that he does not drink alcohol or use illicit drugs.  Allergies:  Allergies  Allergen Reactions  . Accupril [Quinapril Hcl]     Skin rash,  Lip swelling     Medications:                                                                                                                         Current facility-administered medications:  .  0.9 %  sodium chloride infusion, , Intravenous, Continuous, Deneise LeverParth Saraiya, MD .  aspirin tablet 325 mg, 325 mg, Oral, Daily, Alexa Dulcy FannyM Richardson, MD, 325 mg at 07/03/15 1130 .  [START ON 07/04/2015] atorvastatin (LIPITOR) tablet 80 mg, 80 mg, Oral, QHS, Alexa Dulcy FannyM Richardson, MD .  clopidogrel (PLAVIX) tablet 75 mg, 75 mg, Oral, Daily, Alexa Dulcy FannyM Richardson, MD, 75 mg at 07/03/15 1620 .  enoxaparin (LOVENOX) injection 40 mg, 40 mg, Subcutaneous, Daily, Alexa Dulcy FannyM Richardson, MD, 40 mg at 07/03/15 1619 .  insulin aspart (novoLOG) injection 0-5 Units, 0-5 Units, Subcutaneous, QHS, Alexa Dulcy FannyM Richardson, MD .  insulin aspart (novoLOG) injection 0-9 Units, 0-9 Units, Subcutaneous, TID WC, Alexa Dulcy FannyM Richardson, MD, 1 Units at 07/03/15  1633 .  insulin aspart (novoLOG) injection 10 Units, 10 Units, Subcutaneous, TID AC, Alexa Dulcy FannyM Richardson, MD, 10 Units at 07/03/15 1633 .  insulin glargine (LANTUS) injection 45 Units, 45 Units, Subcutaneous, Daily, Alexa Dulcy FannyM Richardson, MD, 45 Units at 07/03/15 1620 .  ondansetron (ZOFRAN) tablet 4 mg, 4 mg, Oral, Q6H PRN **OR** ondansetron (ZOFRAN) injection 4 mg, 4 mg, Intravenous, Q6H PRN, Alexa Dulcy FannyM Richardson, MD .  pantoprazole (PROTONIX) EC tablet 40 mg, 40 mg, Oral, Daily, Alexa Dulcy FannyM Richardson, MD, 40 mg at 07/03/15 1130 .  sodium chloride 0.9 % injection 3 mL, 3 mL, Intravenous, Q12H, Alexa Dulcy FannyM Richardson, MD   ROS:                                                                                                                                       History obtained from the patient  General ROS: negative for - chills, fatigue, fever, night sweats, weight gain or weight loss Psychological ROS: negative for - behavioral disorder, hallucinations, memory difficulties, mood swings or suicidal ideation Ophthalmic ROS: negative for -  blurry vision, double vision, eye pain or loss of vision ENT ROS: negative for - epistaxis, nasal discharge, oral lesions, sore throat, tinnitus or vertigo Allergy and Immunology ROS: negative for - hives or itchy/watery eyes Hematological and Lymphatic ROS: negative for - bleeding problems, bruising or swollen lymph nodes Endocrine ROS: negative for - galactorrhea, hair pattern changes, polydipsia/polyuria or temperature intolerance Respiratory ROS: negative for - cough, hemoptysis, shortness of breath or wheezing Cardiovascular ROS: negative for - chest pain, dyspnea on exertion, edema or irregular heartbeat Gastrointestinal ROS: Positive for diarrhea , negative for - abdominal pain, hematemesis, nausea/vomiting or stool incontinence Genito-Urinary ROS: negative for - dysuria, hematuria, incontinence or urinary frequency/urgency Musculoskeletal ROS: negative for - joint  swelling or muscular weakness Neurological ROS: as noted in HPI Dermatological ROS: negative for rash and skin lesion changes  Neurologic Examination:                                                                                                      Blood pressure 121/63, pulse 64, temperature 98.2 F (36.8 C), temperature source Oral, resp. rate 16, height 5\' 11"  (1.803 m), weight 99.111 kg (218 lb 8 oz), SpO2 100 %.  Evaluation of higher integrative functions including: Level of alertness: Alert,  Oriented to time, place and person Recent and remote memory - intact   Attention span and concentration  - intact   Speech: fluent, no evidence of dysarthria or aphasia noted.  Test the following cranial nerves: 2-12 grossly intact Motor examination: Normal tone, bulk, full 5/5 motor strength in all 4 extremities Examination of sensation : Reports decreased pinprick sensation in the left face, left upper and lower x-rays compared to right side  Examination of deep tendon reflexes: 2+, normal and symmetric in all extremities, normal plantars bilaterally Test coordination: Normal finger nose testing, with no evidence of limb appendicular ataxia or abnormal involuntary movements or tremors noted.  Gait: Deferred   Lab Results: Basic Metabolic Panel:  Recent Labs Lab 07/02/15 2312 07/02/15 2319 07/03/15 0300  NA 139 142 140  K 3.5 3.4* 3.5  CL 105 103 105  CO2 24  --  24  GLUCOSE 187* 185* 132*  BUN 18 19 17   CREATININE 0.97 0.80 0.92  CALCIUM 9.0  --  9.1    Liver Function Tests:  Recent Labs Lab 07/02/15 2312 07/03/15 0300  AST 29 27  ALT 24 23  ALKPHOS 79 82  BILITOT 0.6 0.6  PROT 7.2 7.6  ALBUMIN 3.8 3.8   No results for input(s): LIPASE, AMYLASE in the last 168 hours. No results for input(s): AMMONIA in the last 168 hours.  CBC:  Recent Labs Lab 07/02/15 2312 07/02/15 2319 07/03/15 0300  WBC 7.9  --  7.8  NEUTROABS 5.9  --   --   HGB 13.6 15.0 13.6  HCT  39.2 44.0 39.7  MCV 82.5  --  82.7  PLT 251  --  260    Cardiac Enzymes:  Recent Labs Lab 07/03/15 0300 07/03/15 1515  TROPONINI <0.03 <0.03    Lipid Panel:  Recent Labs Lab 07/03/15 0300  CHOL 140  TRIG 50  HDL 33*  CHOLHDL 4.2  VLDL 10  LDLCALC 97    CBG:  Recent Labs Lab 07/02/15 2328 07/03/15 0745 07/03/15 1629  GLUCAP 185* 74 139*    Microbiology: Results for orders placed or performed during the hospital encounter of 07/02/15  C difficile quick screen w PCR reflex     Status: None   Collection Time: 07/03/15  2:56 PM  Result Value Ref Range Status   C Diff antigen NEGATIVE NEGATIVE Final   C Diff toxin NEGATIVE NEGATIVE Final   C Diff interpretation Negative for toxigenic C. difficile  Final     Imaging: Ct Head Wo Contrast  07/02/2015  CLINICAL DATA:  Left-sided numbness and weakness for 4 hours. EXAM: CT HEAD WITHOUT CONTRAST TECHNIQUE: Contiguous axial images were obtained from the base of the skull through the vertex without intravenous contrast. COMPARISON:  09/25/2014 FINDINGS: Ventricles normal in size and configuration. There are no parenchymal masses or mass effect, no evidence of an infarct, no extra-axial masses or abnormal fluid collections and no intracranial hemorrhage. Moderate mucosal thickening lines the maxillary sinuses with mild ethmoid sinus mucosal thickening. Clear mastoid air cells. IMPRESSION: 1. No intracranial abnormality. 2. Sinus disease as described. Electronically Signed   By: Amie Portland M.D.   On: 07/02/2015 23:49   Mr [name] Wo Contrast  07/03/2015  CLINICAL DATA:  TIA. Episode of sudden onset left facial and arm numbness as well as nausea, vomiting, and diaphoresis lasting 20-30 minutes. Baseline left leg weakness from a prior stroke. Left carotid endarterectomy in 03/2013. EXAM: MRI HEAD WITHOUT CONTRAST MRA HEAD WITHOUT CONTRAST TECHNIQUE: Multiplanar, multiecho pulse sequences of the brain and surrounding structures  were obtained without intravenous contrast. Angiographic images of the head were obtained using MRA technique without contrast. COMPARISON:  Head CT 07/02/2015. Head and neck CTA 03/27/2013. Head MRI/ MRA 03/26/2013. FINDINGS: MRI HEAD FINDINGS There is mildly increased signal in the posterior limb of the left internal capsule on axial diffusion images. The majority of this signal does not appear significantly changed from a 10/03/2010 brain MRI. A punctate focus of signal abnormality at the more anterior aspect of this region was not present in 2012 the was present on the more recent 2014 study. No clearly restricted diffusion is confirmed on the coronal sequence. This would also not explain left-sided numbness and therefore is not felt to reflect an acute infarct. No restricted diffusion is identified in the right cerebral hemisphere, brainstem, or cerebellum. There is no evidence of intracranial hemorrhage, mass, midline shift, or extra-axial fluid collection. There is mild generalized cerebral atrophy. A chronic infarct is again seen in the right paramedian pons. Orbits are unremarkable. Moderate left greater than right maxillary sinus mucosal thickening and minimal bilateral ethmoid air cell mucosal thickening are noted. The mastoid air cells are clear. Major intracranial vascular flow voids are preserved. MRA HEAD FINDINGS The visualized distal vertebral arteries are patent. Irregularity and mild narrowing of the right V4 segment is unchanged. The left PICA origin is patent. Right PICA is not well evaluated. Right AICA and bilateral SCA origins are patent. The basilar artery is patent with moderate stenosis proximally, unchanged. There is a fetal type origin of the right PCA with hypoplastic P1 segment. A left posterior communicating artery is not clearly identified. PCAs are patent without significant proximal stenosis. The internal carotid arteries are patent from skullbase to carotid termini, with improved  flow related  enhancement of the left ICA following interval endarterectomy. There is evidence of mild cavernous ICA narrowing bilaterally without definite interval change. ACAs and MCAs are patent with mild branch vessel irregularity but no evidence of significant proximal stenosis or major branch vessel occlusion. No intracranial aneurysm is identified. IMPRESSION: 1. No definite acute infarct. Mild diffusion signal abnormality in the left internal capsule largely appears chronic and would not explain the left-sided numbness. 2. Chronic pontine infarct. 3. No large vessel occlusion. Mild bilateral cavernous ICA stenosis. 4. Moderate proximal basilar artery stenosis, unchanged. Electronically Signed   By: Sebastian Ache M.D.   On: 07/03/2015 07:52   Mr Brain Wo Contrast  07/03/2015  CLINICAL DATA:  TIA. Episode of sudden onset left facial and arm numbness as well as nausea, vomiting, and diaphoresis lasting 20-30 minutes. Baseline left leg weakness from a prior stroke. Left carotid endarterectomy in 03/2013. EXAM: MRI HEAD WITHOUT CONTRAST MRA HEAD WITHOUT CONTRAST TECHNIQUE: Multiplanar, multiecho pulse sequences of the brain and surrounding structures were obtained without intravenous contrast. Angiographic images of the head were obtained using MRA technique without contrast. COMPARISON:  Head CT 07/02/2015. Head and neck CTA 03/27/2013. Head MRI/ MRA 03/26/2013. FINDINGS: MRI HEAD FINDINGS There is mildly increased signal in the posterior limb of the left internal capsule on axial diffusion images. The majority of this signal does not appear significantly changed from a 10/03/2010 brain MRI. A punctate focus of signal abnormality at the more anterior aspect of this region was not present in 2012 the was present on the more recent 2014 study. No clearly restricted diffusion is confirmed on the coronal sequence. This would also not explain left-sided numbness and therefore is not felt to reflect an acute infarct.  No restricted diffusion is identified in the right cerebral hemisphere, brainstem, or cerebellum. There is no evidence of intracranial hemorrhage, mass, midline shift, or extra-axial fluid collection. There is mild generalized cerebral atrophy. A chronic infarct is again seen in the right paramedian pons. Orbits are unremarkable. Moderate left greater than right maxillary sinus mucosal thickening and minimal bilateral ethmoid air cell mucosal thickening are noted. The mastoid air cells are clear. Major intracranial vascular flow voids are preserved. MRA HEAD FINDINGS The visualized distal vertebral arteries are patent. Irregularity and mild narrowing of the right V4 segment is unchanged. The left PICA origin is patent. Right PICA is not well evaluated. Right AICA and bilateral SCA origins are patent. The basilar artery is patent with moderate stenosis proximally, unchanged. There is a fetal type origin of the right PCA with hypoplastic P1 segment. A left posterior communicating artery is not clearly identified. PCAs are patent without significant proximal stenosis. The internal carotid arteries are patent from skullbase to carotid termini, with improved flow related enhancement of the left ICA following interval endarterectomy. There is evidence of mild cavernous ICA narrowing bilaterally without definite interval change. ACAs and MCAs are patent with mild branch vessel irregularity but no evidence of significant proximal stenosis or major branch vessel occlusion. No intracranial aneurysm is identified. IMPRESSION: 1. No definite acute infarct. Mild diffusion signal abnormality in the left internal capsule largely appears chronic and would not explain the left-sided numbness. 2. Chronic pontine infarct. 3. No large vessel occlusion. Mild bilateral cavernous ICA stenosis. 4. Moderate proximal basilar artery stenosis, unchanged. Electronically Signed   By: Sebastian Ache M.D.   On: 07/03/2015 07:52    Assessment and  plan:   Shawn Hood is an 60 y.o. male patient who presented  with worsening left-sided numbness involving the left face and left arm starting yesterday. He has a history of chronic right pontine paramedian infarct with residual left leg numbness. I suspect that his new symptoms or worsening left-sided numbness are secondary to dehydration from his ongoing diarrhea for the past few days with reactivation of his chronic stroke symptoms. No evidence of acute stroke noted on the brain MRI study.  He had an MRA of the head which did not show any large vessel occlusion. Stable moderate proximal basilar artery stenosis has been reported.He is currently on aspirin , Plavix and lipitor. Recommend continue the same. Echocardiogram has been ordered. He is on cardiac telemetry. Ultrasound of the carotids is also ordered.  No other new recommendations from neurology standpoint.  please call for any further questions.

## 2015-07-03 NOTE — Evaluation (Signed)
Physical Therapy Evaluation Patient Details Name: Shawn Hood MRN: [id number] DOB: January 21, 1956 Today's Date: 07/03/2015   History of Present Illness  Patient is a 60 yo male admitted 07/02/15 with increase in Lt UE/LE/facial weakness and paresthesia.  Symptoms resolved in ED per chart.  PMH:  HTN, HLD, DM, CAD, MI, CABG, CVA - pontine and lacunar infarcts with resultant mild Lt hemiparesis.  Clinical Impression  Patient is functioning at baseline level related to mobility and gait.  Ambulates with no assistive device with no loss of balance.  No further PT needs identified - PT will sign off.  Encouraged ambulation with nursing.    Follow Up Recommendations No PT follow up;Supervision for mobility/OOB    Equipment Recommendations  None recommended by PT    Recommendations for Other Services       Precautions / Restrictions Precautions Precautions: None Restrictions Weight Bearing Restrictions: No      Mobility  Bed Mobility Overal bed mobility: Independent                Transfers Overall transfer level: Modified independent Equipment used: None             General transfer comment: Increased time.  Good balance in stance.  Ambulation/Gait Ambulation/Gait assistance: Modified independent (Device/Increase time) Ambulation Distance (Feet): 30 Feet Assistive device: None Gait Pattern/deviations: Step-through pattern;Decreased stride length;Trendelenburg Gait velocity: decreased Gait velocity interpretation: Below normal speed for age/gender General Gait Details: Patient with slower gait, with noted trendelenberg on Lt.  No loss of balance.  Increased time.  Patient reports this is his baseline.  Stairs            Wheelchair Mobility    Modified Rankin (Stroke Patients Only) Modified Rankin (Stroke Patients Only) Pre-Morbid Rankin Score: No significant disability Modified Rankin: No significant disability     Balance Overall balance assessment: No  apparent balance deficits (not formally assessed)                                           Pertinent Vitals/Pain Pain Assessment: No/denies pain    Home Living Family/patient expects to be discharged to:: Private residence Living Arrangements: Spouse/significant other;Children;Other relatives Available Help at Discharge: Family;Available 24 hours/day Type of Home: House Home Access: Level entry     Home Layout: Two level;Able to live on main level with bedroom/bathroom Home Equipment: None      Prior Function Level of Independence: Independent         Comments: Ambulates with no assistive device.  Independent with ADL's.  Patient drives.     Hand Dominance   Dominant Hand: Right    Extremity/Trunk Assessment   Upper Extremity Assessment: Overall WFL for tasks assessed;LUE deficits/detail       LUE Deficits / Details: Patient with slight weakness especially distally from prior CVA.   Lower Extremity Assessment: Overall WFL for tasks assessed;LLE deficits/detail   LLE Deficits / Details: Only slight weakness noted from prior CVA.  Good DF.  Did note slightly weak hip abductors, with slight trendelenberg gait.  Cervical / Trunk Assessment: Normal;Other exceptions (Decreased to light touch on Lt side of face - chronic.)  Communication   Communication: No difficulties  Cognition Arousal/Alertness: Awake/alert Behavior During Therapy: WFL for tasks assessed/performed Overall Cognitive Status: Within Functional Limits for tasks assessed  General Comments      Exercises General Exercises - Lower Extremity Hip ABduction/ADduction: AROM;Left;Strengthening;10 reps;Standing (Holding on to chair or counter)      Assessment/Plan    PT Assessment Patent does not need any further PT services  PT Diagnosis Abnormality of gait;Generalized weakness   PT Problem List    PT Treatment Interventions     PT Goals (Current  goals can be found in the Care Plan section) Acute Rehab PT Goals PT Goal Formulation: All assessment and education complete, DC therapy    Frequency     Barriers to discharge        Co-evaluation               End of Session   Activity Tolerance: Patient tolerated treatment well Patient left: in bed;with call bell/phone within reach;with family/visitor present (sitting EOB) Nurse Communication: Mobility status (No PT needs - patient at baseline).  Encouraged ambulation with supervision.         Time: [id number] PT Time Calculation (min) (ACUTE ONLY): 16 min   Charges:   PT Evaluation $PT Eval Moderate Complexity: 1 Procedure     PT G Codes:        Vena AustriaDavis, Isha Seefeld H 07/03/2015, 4:16 PM Durenda HurtSusan H. Renaldo Fiddleravis, PT, [facility] Acute Rehab Services Pager [phone]

## 2015-07-03 NOTE — Progress Notes (Signed)
*  PRELIMINARY RESULTS* Echocardiogram 2D Echocardiogram has been performed.  Jeryl Columbialliott, Kary Sugrue 07/03/2015, 10:01 AM

## 2015-07-03 NOTE — ED Notes (Signed)
Admitting residents at bedside 

## 2015-07-03 NOTE — ED Notes (Signed)
Pt only drank milk and juice from breakfast tray-- will order some cheerios per pt's request.

## 2015-07-03 NOTE — Progress Notes (Signed)
VASCULAR LAB PRELIMINARY  PRELIMINARY  PRELIMINARY  PRELIMINARY  Carotid duplex  completed.    Preliminary report:  Bilateral:  1-39% ICA stenosis.  Vertebral artery flow is antegrade.      Lyllian Gause, RVT 07/03/2015, 9:26 AM

## 2015-07-03 NOTE — H&P (Signed)
Date: 07/03/2015               Patient Name:  Shawn Hood MRN: [id number]  DOB: 01-25-1956 Age / Sex: 60 y.o., male   PCP: Hyman Hopes, MD         Medical Service: Internal Medicine Teaching Service         Attending Physician: Dr. Earl Lagos, MD    First Contact: Dr. Johnny Bridge Pager: [phone]  Second Contact: Dr. Danella Penton Pager: [phone]       After Hours (After 5p/  First Contact Pager: [phone]  weekends / holidays): Second Contact Pager: [phone]   Chief Complaint: left sided numbness, N/V  History of Present Illness: Shawn Hood is a 60 yo male with HTN, HLD, DMII, CAD s/p CABG (2002) and DES, and h/o CVA (chronic pontine and lacunar infarcts), presenting with left sided numbness.  Patient reports sudden onset of numbness of left face and arm, as well as N/V and diaphoresis.  He then took an ASA 81 mg and symptoms lasted 20-30 minutes, resolving spontaneously before arrival to the ED.  At baseline, he has left leg weakness from his prior stroke.   He denies CP, SOB, tobacco, alcohol, or drug use.  He reports adherence to his medication regimen, which includes ASA/Plavix.  He has also had 2 weeks of N/V and diarrhea.  He states he will have vomiting and diarrhea, 3-4 episodes per day.  Vomiting is worse at night and after eating.  Last night was the first time he noticed blood in the vomit.   His symptoms started after he ran out of a prescription medication of acid reflux.  He denies sick contacts or recent antibiotic use.  He denies hematochezia or melena.  He had a colonoscopy 2 months ago that was normal.    He endorses runny nose and chronic leg swelling.  He denies fever, chills, cough, orthopnea, dysphagia, or dysuria.  He is able to walk 1.5 miles before he has chest pain relieved with rest.  Meds: Current Facility-Administered Medications  Medication Dose Route Frequency Provider Last Rate Last Dose  . aspirin tablet 325 mg  325 mg Oral Daily Alexa Dulcy Fanny, MD       . Melene Muller ON 07/04/2015] aspirin tablet 81 mg  81 mg Oral Daily Alexa Dulcy Fanny, MD      . Melene Muller ON 07/04/2015] atorvastatin (LIPITOR) tablet 80 mg  80 mg Oral QHS Alexa Dulcy Fanny, MD      . clopidogrel (PLAVIX) tablet 75 mg  75 mg Oral Daily Alexa Dulcy Fanny, MD      . enoxaparin (LOVENOX) injection 40 mg  40 mg Subcutaneous Q24H Alexa Dulcy Fanny, MD      . insulin aspart (novoLOG) injection 0-5 Units  0-5 Units Subcutaneous QHS Alexa Dulcy Fanny, MD      . insulin aspart (novoLOG) injection 0-9 Units  0-9 Units Subcutaneous TID WC Alexa Dulcy Fanny, MD      . insulin aspart (novoLOG) injection 10 Units  10 Units Subcutaneous TID AC Alexa Dulcy Fanny, MD      . insulin glargine (LANTUS) injection 45 Units  45 Units Subcutaneous BH-q7a Alexa Dulcy Fanny, MD      . ondansetron [facility]) tablet 4 mg  4 mg Oral Q6H PRN Alexa Dulcy Fanny, MD       Or  . ondansetron (ZOFRAN) injection 4 mg  4 mg Intravenous Q6H PRN Alexa Dulcy Fanny, MD      .  pantoprazole (PROTONIX) EC tablet 40 mg  40 mg Oral Daily Alexa Dulcy FannyM Richardson, MD      . sodium chloride 0.9 % injection 3 mL  3 mL Intravenous Q12H Alexa Dulcy FannyM Richardson, MD       Current Outpatient Prescriptions  Medication Sig Dispense Refill  . aspirin 81 MG tablet Take 1 tablet (81 mg total) by mouth daily. 30 tablet 0  . atorvastatin (LIPITOR) 80 MG tablet Take 80 mg by mouth at bedtime.    . clopidogrel (PLAVIX) 75 MG tablet Take 75 mg by mouth daily.    . diphenhydrAMINE (BENADRYL) 25 mg capsule Take 1 capsule (25 mg total) by mouth every 6 (six) hours as needed for itching. 30 capsule 0  . insulin aspart (NOVOLOG) 100 UNIT/ML injection Inject 20 Units into the skin 3 (three) times daily before meals.     . insulin glargine (LANTUS) 100 UNIT/ML injection Inject 60 Units into the skin every morning.     Marland Kitchen. losartan (COZAAR) 25 MG tablet Take 25 mg by mouth daily.    . metFORMIN (GLUCOPHAGE) 500 MG tablet Take 1,000 mg by mouth 2 (two) times  daily with a meal.     . nitroGLYCERIN (NITROSTAT) 0.4 MG SL tablet Place 1 tablet (0.4 mg total) under the tongue every 5 (five) minutes as needed for chest pain. 30 tablet 0  . quinapril (ACCUPRIL) 20 MG tablet Take 20 mg by mouth daily.       Allergies: Allergies as of 07/02/2015 - Review Complete 07/02/2015  Allergen Reaction Noted  . Accupril [quinapril hcl]  04/21/2013   Past Medical History  Diagnosis Date  . Diabetes mellitus type 2, uncontrolled, with complications (HCC) DX: Age 60  . Hypertension   . Coronary artery disease     Hera. s/p 3-vessel CABG 2002. b NSTEMI 09/2010 with patent grafts, no obvious culprit, for med rx.  c. DES to SVG-OM, severe diffuse disease per LHC.  c.  DES to SVG to OM  9/13  . Hyperlipidemia   . 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  . Sinus bradycardia   . Myocardial infarction (HCC) 2002  . Shortness of breath   . Stroke [facility](HCC) 2006 ?    left arm & leg   Past Surgical History  Procedure Laterality Date  . Coronary artery bypass graft  2002  . Shoulder surgery Left   . Cholecystectomy    . Coronary angioplasty with stent placement  2013  . Endarterectomy Left 03/30/2013    Procedure: ENDARTERECTOMY CAROTID;  Surgeon: Larina Earthlyodd F Early, MD;  [facility];  Service: Vascular;  Laterality: Left;  . Left heart catheterization with coronary/graft angiogram N/A 02/29/2012    Procedure: LEFT HEART CATHETERIZATION WITH Isabel CapriceORONARY/GRAFT ANGIOGRAM;  Surgeon: Rollene RotundaJames Hochrein, MD;  [facility];  Service: Cardiovascular;  Laterality: N/A;  . Percutaneous coronary stent intervention (pci-s)  02/29/2012    Procedure: PERCUTANEOUS CORONARY STENT INTERVENTION (PCI-S);  Surgeon: Rollene RotundaJames Hochrein, MD;  [facility];  Service: Cardiovascular;;   Family History  Problem Relation Age of Onset  . Coronary artery disease Mother     Died at 6261 of MI  . Coronary artery disease Brother     Died of CAD but had other  medical problems  . Diabetes Mother   . Hypertension Mother   . Diabetes Sister   . Diabetes Brother   . Hypertension Brother    Social  History   Social History  . Marital Status: Married    Spouse Name: N/A  . Number of Children: 4  . Years of Education: 12th grade   Occupational History  . Disabled     since 2005, for his heart disease. Previously worked in [employer].   Social History Main Topics  . Smoking status: Former Smoker -- 1.50 packs/day for 10 years    Types: Cigarettes    Quit date: 06/26/1991  . Smokeless tobacco: Never Used     Comment: Quit 1993  . Alcohol Use: No  . Drug Use: No  . Sexual Activity: Yes   Other Topics Concern  . 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      Review of Systems: Pertinent items are noted in HPI.  Physical Exam: Blood pressure 148/91, pulse 72, temperature 98.7 F (37.1 C), temperature source Oral, resp. rate 18, height 5\' 11"  (1.803 m), weight 214 lb (97.07 kg), SpO2 99 %. Physical Exam  Constitutional: He is oriented to person, place, and time and well-developed, well-nourished, and in no distress. No distress.  HENT:  Head: Normocephalic and atraumatic.  Mouth/Throat: Oropharynx is clear and moist.  Eyes: EOM are normal. Pupils are equal, round, and reactive to light. No scleral icterus.  Neck: Neck supple. No JVD present. No tracheal deviation present.  Cardiovascular: Normal rate, regular rhythm, normal heart sounds and intact distal pulses.   Pulmonary/Chest: Effort normal and breath sounds normal. No stridor. No respiratory distress. He has no wheezes.  Abdominal: Soft. He exhibits no distension. There is no tenderness. There is no rebound and no guarding.  Musculoskeletal: He exhibits no edema.  Neurological: He is alert and oriented to person, place, and time.  CN II-XII intact, excepting decreased sensation to light touch on left face,  unchanged from baseline.  Strength 5/5 in bilateral upper and lower extremity.  Biceps and patellar reflexes 2+ and symmetric.  Babinski downgoing bilaterally.  No pronator drift.  No dysmetria to FNF.  Mild left sided dysmetria to HKS on left.  No HKS dysmetria on right.  Skin: Skin is warm and dry. No rash noted. He is not diaphoretic.     Lab results: Basic Metabolic Panel:  Recent Labs  16/10/96 2312 07/02/15 2319  NA 139 142  K 3.5 3.4*  CL 105 103  CO2 24  --   GLUCOSE 187* 185*  BUN 18 19  CREATININE 0.97 0.80  CALCIUM 9.0  --    Liver Function Tests:  Recent Labs  07/02/15 2312  AST 29  ALT 24  ALKPHOS 79  BILITOT 0.6  PROT 7.2  ALBUMIN 3.8   No results for input(s): LIPASE, AMYLASE in the last 72 hours. No results for input(s): AMMONIA in the last 72 hours. CBC:  Recent Labs  07/02/15 2312 07/02/15 2319  WBC 7.9  --   NEUTROABS 5.9  --   HGB 13.6 15.0  HCT 39.2 44.0  MCV 82.5  --   PLT 251  --    Cardiac Enzymes: No results for input(s): CKTOTAL, CKMB, CKMBINDEX, TROPONINI in the last 72 hours. BNP: No results for input(s): PROBNP in the last 72 hours. D-Dimer: No results for input(s): DDIMER in the last 72 hours. CBG:  Recent Labs  07/02/15 2328  GLUCAP 185*   Hemoglobin A1C: No results for input(s): HGBA1C in the last 72 hours. Fasting Lipid Panel: No  results for input(s): CHOL, HDL, LDLCALC, TRIG, CHOLHDL, LDLDIRECT in the last 72 hours. Thyroid Function Tests: No results for input(s): TSH, T4TOTAL, FREET4, T3FREE, THYROIDAB in the last 72 hours. Anemia Panel: No results for input(s): VITAMINB12, FOLATE, FERRITIN, TIBC, IRON, RETICCTPCT in the last 72 hours. Coagulation:  Recent Labs  07/02/15 2312  LABPROT 13.8  INR 1.04   Urine Drug Screen: Drugs of Abuse  No results found for: LABOPIA, COCAINSCRNUR, LABBENZ, AMPHETMU, THCU, LABBARB  Alcohol Level: No results for input(s): ETH in the last 72 hours. Urinalysis: No results  for input(s): COLORURINE, LABSPEC, PHURINE, GLUCOSEU, HGBUR, BILIRUBINUR, KETONESUR, PROTEINUR, UROBILINOGEN, NITRITE, LEUKOCYTESUR in the last 72 hours.  Invalid input(s): APPERANCEUR Misc. Labs:   Imaging results:  Ct Head Wo Contrast  07/02/2015  CLINICAL DATA:  Left-sided numbness and weakness for 4 hours. EXAM: CT HEAD WITHOUT CONTRAST TECHNIQUE: Contiguous axial images were obtained from the base of the skull through the vertex without intravenous contrast. COMPARISON:  09/25/2014 FINDINGS: Ventricles normal in size and configuration. There are no parenchymal masses or mass effect, no evidence of an infarct, no extra-axial masses or abnormal fluid collections and no intracranial hemorrhage. Moderate mucosal thickening lines the maxillary sinuses with mild ethmoid sinus mucosal thickening. Clear mastoid air cells. IMPRESSION: 1. No intracranial abnormality. 2. Sinus disease as described. Electronically Signed   By: Amie Portland M.D.   On: 07/02/2015 23:49    Other results: EKG: unchanged from previous tracings, normal sinus rhythm.  Assessment & Plan by Problem: Principal Problem:   TIA (transient ischemic attack) Active Problems:   HTN (hypertension)   Hyperlipidemia   CAD (coronary artery disease) of artery bypass graft   H/O: CVA (cerebrovascular accident)   Diabetes mellitus type 2, uncontrolled, with complications [facility])  Mr. Stooksbury is a 60 yo male with HTN, HLD, DMII, CAD s/p CABG (2002) and DES, and h/o CVA (chronic pontine and lacunar infarcts), presenting with left sided numbness.    TIA: Patient presents with resolved episode of left sided numbness lasting 30 minutes.  He has a history of HTN, HLD, DMII, CAD, and CVA, all raising his risk of repeat TIA/CVA.  BP currently controlled, but last A1c in our system was 9.4% in 2014.  He denies tobacco use.  He is currently on ASA and Plavix and reports adherence.  In that case, this patient is considered a Plavix failure and  alternative APT should be considered.  Will perform TIA workup and follow up as necessary. [ ]  Lipids, A1c [ ]  Echo [ ]  Carotid US [ ]  MRI/MRA brain [ ]  Troponins - Teley - ECG in AM - HOLD Losartan for permissive HTN - ASA 325 mg, then 81 mg daily - PT/OT - Consider switch from Plavix to Brilinta  N/V/Diarrhea: Patient reports 2 weeks of chronic N/V and diarrhea, stating symptoms started after running out of anti-reflux medication.  Etiology unclear, but unlikely to be GERD alone.  Differential remains broad.  Will initiate workup with C diff, HIV, and TSH.  Patient has had recent colonoscopy, but given N/V and new blood in vomit, EGD may be warranted. [ ]  HIV [ ]  TSH [ ]  C diff - Protonix - Consider GI consult  DMII: No recent A1c.  Home regimen is Novolog 20 U qAC and Lantus 60 U qAM. [ ]  A1c - SSI - Novolog 10 U qAC - Lantus 45 U qAM  HTN: Well controlled.   - HOLD Losartan for permissive HTN HLD: Atorvastatin 80 GERD:  Protonix  FEN/GI:  - HH/Carb  DVT Ppx: Lovenox   Dispo: Disposition is deferred at this time, awaiting improvement of current medical problems. Anticipated discharge in approximately 1-2 day(s).   The patient does have a current PCP (Harriett Pietro Cassis, MD) and does need an [facility] hospital follow-up appointment after discharge.  The patient does not have transportation limitations that hinder transportation to clinic appointments.  Signed: Jana Half, MD. PhD 07/03/2015, 2:50 AM

## 2015-07-03 NOTE — ED Provider Notes (Addendum)
CSN: [id number]     Arrival date & time 07/02/15  2300 History   First MD Initiated Contact with Patient 07/02/15 2308     Chief Complaint  Patient presents with  . Numbness  . Tingling  . Headache     (Consider location/radiation/quality/duration/timing/severity/associated sxs/prior Treatment) HPI Comments: Pt comes in with cc of L sided numbness, tingling, heaviness. Pt has hx of iddm, htn, hl, cad, strokes. Reports that around 8:30, he started getting nauseated, sweaty and having L sided numbness, and heaviness. The symptoms lasted for 20-30 min and then resolved. With his stroke, he had L sided numbness and weakness - still has some residual symptoms on the L side. Pt thinks the symptoms were similar to his stroke than MI. Pt has CAD s/p stent placement and also had a carotid artery intervention in 2014 for stroke like symptoms.   ROS 10 Systems reviewed and are negative for acute change except as noted in the HPI.     Patient is a 60 y.o. male presenting with headaches. The history is provided by the patient.  Headache   Past Medical History  Diagnosis Date  . Diabetes mellitus type 2, uncontrolled, with complications (HCC) DX: Age 60  . Hypertension   . Coronary artery disease     Hera. s/p 3-vessel CABG 2002. b NSTEMI 09/2010 with patent grafts, no obvious culprit, for med rx.  c. DES to SVG-OM, severe diffuse disease per LHC.  c.  DES to SVG to OM  9/13  . Hyperlipidemia   . 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  . Sinus bradycardia   . Myocardial infarction (HCC) 2002  . Shortness of breath   . Stroke [facility](HCC) 2006 ?    left arm & leg   Past Surgical History  Procedure Laterality Date  . Coronary artery bypass graft  2002  . Shoulder surgery Left   . Cholecystectomy    . Coronary angioplasty with stent placement  2013  . Endarterectomy Left 03/30/2013    Procedure: ENDARTERECTOMY CAROTID;  Surgeon: Larina Earthlyodd F Early, MD;  [facility];  Service: Vascular;  Laterality: Left;  . Left heart catheterization with coronary/graft angiogram N/A 02/29/2012    Procedure: LEFT HEART CATHETERIZATION WITH Isabel CapriceORONARY/GRAFT ANGIOGRAM;  Surgeon: Rollene RotundaJames Hochrein, MD;  [facility];  Service: Cardiovascular;  Laterality: N/A;  . Percutaneous coronary stent intervention (pci-s)  02/29/2012    Procedure: PERCUTANEOUS CORONARY STENT INTERVENTION (PCI-S);  Surgeon: Rollene RotundaJames Hochrein, MD;  [facility];  Service: Cardiovascular;;   Family History  Problem Relation Age of Onset  . Coronary artery disease Mother     Died at 4961 of MI  . Coronary artery disease Brother     Died of CAD but had other medical problems  . Diabetes Mother   . Hypertension Mother   . Diabetes Sister   . Diabetes Brother   . Hypertension Brother    Social History  Substance Use Topics  . Smoking status: Former Smoker -- 1.50 packs/day for 10 years    Types: Cigarettes    Quit date: 06/26/1991  . Smokeless tobacco: Never Used     Comment: Quit 1993  . Alcohol Use: No    Review of Systems  Neurological: Positive for headaches.      Allergies  Accupril  Home Medications   Prior to Admission medications   Medication Sig Start Date End Date Taking? Authorizing Provider  aspirin 81 MG tablet Take 1 tablet (81 mg total) by mouth daily. 03/31/13  Yes Tyrone Nine, MD  atorvastatin (LIPITOR) 80 MG tablet Take 80 mg by mouth at bedtime.   Yes Historical Provider, MD  clopidogrel (PLAVIX) 75 MG tablet Take 75 mg by mouth daily.   Yes Historical Provider, MD  diphenhydrAMINE (BENADRYL) 25 mg capsule Take 1 capsule (25 mg total) by mouth every 6 (six) hours as needed for itching. 04/18/13  Yes Derwood Kaplan, MD  insulin aspart (NOVOLOG) 100 UNIT/ML injection Inject 20 Units into the skin 3 (three) times daily before meals.    Yes Historical Provider, MD  insulin glargine (LANTUS) 100 UNIT/ML injection Inject 60 Units into the skin every morning.   10/04/11  Yes Laveda Norman, MD  losartan (COZAAR) 25 MG tablet Take 25 mg by mouth daily.   Yes Historical Provider, MD  metFORMIN (GLUCOPHAGE) 500 MG tablet Take 1,000 mg by mouth 2 (two) times daily with a meal.    Yes Historical Provider, MD  nitroGLYCERIN (NITROSTAT) 0.4 MG SL tablet Place 1 tablet (0.4 mg total) under the tongue every 5 (five) minutes as needed for chest pain. 03/01/12 07/02/15 Yes Na Li, MD  quinapril (ACCUPRIL) 20 MG tablet Take 20 mg by mouth daily.    Yes Historical Provider, MD   BP 135/71 mmHg  Pulse 76  Temp(Src) 98.7 F (37.1 C) (Oral)  Resp 18  Ht 5\' 11"  (1.803 m)  Wt 214 lb (97.07 kg)  BMI 29.86 kg/m2  SpO2 98% Physical Exam  Constitutional: He is oriented to person, place, and time. He appears well-developed and well-nourished.  HENT:  Head: Normocephalic and atraumatic.  Eyes: EOM are normal. Pupils are equal, round, and reactive to light.  Neck: Normal range of motion. Neck supple. No JVD present.  Cardiovascular: Normal rate and regular rhythm.   Pulmonary/Chest: Effort normal and breath sounds normal. No respiratory distress. He has no wheezes.  Abdominal: Soft. Bowel sounds are normal. He exhibits no distension. There is no tenderness. There is no rebound and no guarding.  Neurological: He is alert and oriented to person, place, and time. No cranial nerve deficit. Coordination normal.  Pt has L sided numbness on the face, upper and lower extremity. Motor strength upper and lower extremity 4+ and equal Normal cerebellar exam  Skin: Skin is warm and dry.  Nursing note and vitals reviewed.   ED Course  Procedures (including critical care time) Labs Review Labs Reviewed  COMPREHENSIVE METABOLIC PANEL - Abnormal; Notable for the following:    Glucose, Bld 187 (*)    All other components within normal limits  CBG MONITORING, ED - Abnormal; Notable for the following:    Glucose-Capillary 185 (*)    All other components within normal limits  I-STAT  CHEM 8, ED - Abnormal; Notable for the following:    Potassium 3.4 (*)    Glucose, Bld 185 (*)    All other components within normal limits  I-STAT CG4 LACTIC ACID, ED - Abnormal; Notable for the following:    Lactic Acid, Venous 2.65 (*)    All other components within normal limits  PROTIME-INR  APTT  CBC  DIFFERENTIAL  I-STAT TROPOININ, ED  I-STAT CG4 LACTIC ACID, ED    Imaging Review Ct Head Wo Contrast  07/02/2015  CLINICAL DATA:  Left-sided numbness and weakness for 4 hours. EXAM: CT HEAD WITHOUT CONTRAST TECHNIQUE: Contiguous axial images were obtained from the base of the skull through  the vertex without intravenous contrast. COMPARISON:  09/25/2014 FINDINGS: Ventricles normal in size and configuration. There are no parenchymal masses or mass effect, no evidence of an infarct, no extra-axial masses or abnormal fluid collections and no intracranial hemorrhage. Moderate mucosal thickening lines the maxillary sinuses with mild ethmoid sinus mucosal thickening. Clear mastoid air cells. IMPRESSION: 1. No intracranial abnormality. 2. Sinus disease as described. Electronically Signed   By: Amie Portland M.D.   On: 07/02/2015 23:49   I have personally reviewed and evaluated these images and lab results as part of my medical decision-making.   EKG Interpretation   Date/Time:  Saturday July 02 2015 23:09:57 EST Ventricular Rate:  76 PR Interval:  163 QRS Duration: 96 QT Interval:  373 QTC Calculation: 419 R Axis:   -23 Text Interpretation:  Sinus rhythm Atrial premature complex Probable left  atrial enlargement LVH with secondary repolarization abnormality No  significant change since last tracing Confirmed by Rhunette Croft, MD, Janey Genta  [phone]) on 07/02/2015 11:44:41 PM        MDM   CARDIOLOGY NOTE WITH CATH REPORT:  STUDY CONCLUSIONS:  1. Severe two-vessel coronary artery disease with patent grafts.  2. Chronically occluded OM-2 and possibly OM-1 as well as diagonal   branches, which seems to be supplied by good collaterals.  3. Distal LAD disease close to the apex, which appears to be chronic  and not approachable for percutaneous angioplasty.  4. No culprit was identified for a small non-ST elevation myocardial  infarction.  5. Normal LV systolic function with only mildly elevated left  ventricular end-diastolic pressure.   Final diagnoses:  Transient cerebral ischemia, unspecified transient cerebral ischemia type    Pt comes in with L sided heaviness, tingling, weakness. Symptoms lasted for 20-30 min. Pt has hx of stroke and CAD and has multiple medical comorbidities.  Currently i think the symptoms were TIA and not ACS related. Trop is neg, ekg is not showing any acute changes. Pt is symptoms free. Last CTangio revealed no R sided narrowing - so that makes TIA less likely unless new plaque developed.   CT head is neg.  ABCD2 score is 4. We will admit for TIA workup.      Derwood Kaplan, MD 07/03/15 6045  Derwood Kaplan, MD 07/03/15 4098

## 2015-07-04 DIAGNOSIS — A084 Viral intestinal infection, unspecified: Secondary | ICD-10-CM

## 2015-07-04 LAB — CBC
HEMATOCRIT: 38.2 % — AB (ref 39.0–52.0)
Hemoglobin: 12.4 g/dL — ABNORMAL LOW (ref 13.0–17.0)
MCH: 27.3 pg (ref 26.0–34.0)
MCHC: 32.5 g/dL (ref 30.0–36.0)
MCV: 84.1 fL (ref 78.0–100.0)
PLATELETS: 253 10*3/uL (ref 150–400)
RBC: 4.54 MIL/uL (ref 4.22–5.81)
RDW: 13.3 % (ref 11.5–15.5)
WBC: 6.5 10*3/uL (ref 4.0–10.5)

## 2015-07-04 LAB — GLUCOSE, CAPILLARY
GLUCOSE-CAPILLARY: 76 mg/dL (ref 65–99)
GLUCOSE-CAPILLARY: 121 mg/dL — AB (ref 65–99)
Glucose-Capillary: 146 mg/dL — ABNORMAL HIGH (ref 65–99)

## 2015-07-04 LAB — HEMOGLOBIN A1C
HEMOGLOBIN A1C: 9.6 % — AB (ref 4.8–5.6)
Mean Plasma Glucose: 229 mg/dL

## 2015-07-04 LAB — HIV ANTIBODY (ROUTINE TESTING W REFLEX): HIV Screen 4th Generation wRfx: NONREACTIVE

## 2015-07-04 MED ORDER — PANTOPRAZOLE SODIUM 40 MG PO TBEC: 40.0000 mg | DELAYED_RELEASE_TABLET | Freq: Every day | ORAL | Status: AC

## 2015-07-04 NOTE — Progress Notes (Signed)
Subjective:  Patient seen and examined at bedside in the ER as he was not transferred yet to the floor.  Patient says he is doing well.   He denies diarrhea, vomiting or hematemesis. He says his weakeness has improved. He continues to feel a little numb on his left leg but says it is chronic and no recent changes. He was walking around the room when examined.  He was seen by neurology yesterday- details in a/p.     Objective: Vital signs in last 24 hours: Filed Vitals:   07/04/15 0200 07/04/15 0301 07/04/15 0559 07/04/15 0911  BP: 138/73 138/69 137/71 132/64  Pulse: 61 62 64 66  Temp: 97.7 F (36.5 C) 97.9 F (36.6 C) 97.7 F (36.5 C) 98 F (36.7 C)  TempSrc: Oral Oral Oral Oral  Resp: 16 16 16 18   Height:      Weight:      SpO2: 100% 100% 99% 100%   Weight change: 4 lb 8 oz (2.041 kg) No intake or output data in the 24 hours ending 07/04/15 1048  General: Vital signs reviewed. Patient in no acute distress HEENT: MMM, ncat, Cardiovascular: regular rate, rhythm, no murmur appreciated  Pulmonary/Chest: Clear to auscultation bilaterally, no wheezes, rales, or rhonchi. Abdominal: Soft, +BS, ntnd  Extremities: No lower extremity edema bilaterally, pulses symmetric and intact bilaterally. Skin: Warm, dry and intact. No rashes or erythema. Neurologic exam: MS: A&O  CN II-XII grossly intact Sensory: intact to light touch- feels different on left face and arm Motor: 5/5 strength in upper, 5/5 in lower extremities, normal muscle tone     Lab Results: Results for orders placed or performed during the hospital encounter of 07/02/15 (from the past 24 hour(s))  C difficile quick screen w PCR reflex     Status: None   Collection Time: 07/03/15  2:56 PM  Result Value Ref Range   C Diff antigen NEGATIVE NEGATIVE   C Diff toxin NEGATIVE NEGATIVE   C Diff interpretation Negative for toxigenic C. difficile   Troponin I     Status: None   Collection Time: 07/03/15  3:15 PM    Result Value Ref Range   Troponin I <0.03 <0.031 ng/mL  Glucose, capillary     Status: Abnormal   Collection Time: 07/03/15  4:29 PM  Result Value Ref Range   Glucose-Capillary 139 (H) 65 - 99 mg/dL  Glucose, capillary     Status: Abnormal   Collection Time: 07/03/15 11:45 PM  Result Value Ref Range   Glucose-Capillary 146 (H) 65 - 99 mg/dL   Comment 1 Notify RN    Comment 2 Document in Chart   CBC     Status: Abnormal   Collection Time: 07/04/15  6:48 AM  Result Value Ref Range   WBC 6.5 4.0 - 10.5 K/uL   RBC 4.54 4.22 - 5.81 MIL/uL   Hemoglobin 12.4 (L) 13.0 - 17.0 g/dL   HCT 40.938.2 (L) 81.139.0 - 91.452.0 %   MCV 84.1 78.0 - 100.0 fL   MCH 27.3 26.0 - 34.0 pg   MCHC 32.5 30.0 - 36.0 g/dL   RDW 78.213.3 95.611.5 - 21.315.5 %   Platelets 253 150 - 400 K/uL  Glucose, capillary     Status: None   Collection Time: 07/04/15  6:51 AM  Result Value Ref Range   Glucose-Capillary 76 65 - 99 mg/dL   Comment 1 Notify RN    Comment 2 Document in Chart     Micro  Results: Recent Results (from the past 240 hour(s))  C difficile quick screen w PCR reflex     Status: None   Collection Time: 07/03/15  2:56 PM  Result Value Ref Range Status   C Diff antigen NEGATIVE NEGATIVE Final   C Diff toxin NEGATIVE NEGATIVE Final   C Diff interpretation Negative for toxigenic C. difficile  Final   Studies/Results: Ct Head Wo Contrast  07/02/2015  CLINICAL DATA:  Left-sided numbness and weakness for 4 hours. EXAM: CT HEAD WITHOUT CONTRAST TECHNIQUE: Contiguous axial images were obtained from the base of the skull through the vertex without intravenous contrast. COMPARISON:  09/25/2014 FINDINGS: Ventricles normal in size and configuration. There are no parenchymal masses or mass effect, no evidence of an infarct, no extra-axial masses or abnormal fluid collections and no intracranial hemorrhage. Moderate mucosal thickening lines the maxillary sinuses with mild ethmoid sinus mucosal thickening. Clear mastoid air cells.  IMPRESSION: 1. No intracranial abnormality. 2. Sinus disease as described. Electronically Signed   By: Amie Portland M.D.   On: 07/02/2015 23:49   Mr [name] Wo Contrast  07/03/2015  CLINICAL DATA:  TIA. Episode of sudden onset left facial and arm numbness as well as nausea, vomiting, and diaphoresis lasting 20-30 minutes. Baseline left leg weakness from a prior stroke. Left carotid endarterectomy in 03/2013. EXAM: MRI HEAD WITHOUT CONTRAST MRA HEAD WITHOUT CONTRAST TECHNIQUE: Multiplanar, multiecho pulse sequences of the brain and surrounding structures were obtained without intravenous contrast. Angiographic images of the head were obtained using MRA technique without contrast. COMPARISON:  Head CT 07/02/2015. Head and neck CTA 03/27/2013. Head MRI/ MRA 03/26/2013. FINDINGS: MRI HEAD FINDINGS There is mildly increased signal in the posterior limb of the left internal capsule on axial diffusion images. The majority of this signal does not appear significantly changed from a 10/03/2010 brain MRI. A punctate focus of signal abnormality at the more anterior aspect of this region was not present in 2012 the was present on the more recent 2014 study. No clearly restricted diffusion is confirmed on the coronal sequence. This would also not explain left-sided numbness and therefore is not felt to reflect an acute infarct. No restricted diffusion is identified in the right cerebral hemisphere, brainstem, or cerebellum. There is no evidence of intracranial hemorrhage, mass, midline shift, or extra-axial fluid collection. There is mild generalized cerebral atrophy. A chronic infarct is again seen in the right paramedian pons. Orbits are unremarkable. Moderate left greater than right maxillary sinus mucosal thickening and minimal bilateral ethmoid air cell mucosal thickening are noted. The mastoid air cells are clear. Major intracranial vascular flow voids are preserved. MRA HEAD FINDINGS The visualized distal vertebral  arteries are patent. Irregularity and mild narrowing of the right V4 segment is unchanged. The left PICA origin is patent. Right PICA is not well evaluated. Right AICA and bilateral SCA origins are patent. The basilar artery is patent with moderate stenosis proximally, unchanged. There is a fetal type origin of the right PCA with hypoplastic P1 segment. A left posterior communicating artery is not clearly identified. PCAs are patent without significant proximal stenosis. The internal carotid arteries are patent from skullbase to carotid termini, with improved flow related enhancement of the left ICA following interval endarterectomy. There is evidence of mild cavernous ICA narrowing bilaterally without definite interval change. ACAs and MCAs are patent with mild branch vessel irregularity but no evidence of significant proximal stenosis or major branch vessel occlusion. No intracranial aneurysm is identified. IMPRESSION: 1. No definite  acute infarct. Mild diffusion signal abnormality in the left internal capsule largely appears chronic and would not explain the left-sided numbness. 2. Chronic pontine infarct. 3. No large vessel occlusion. Mild bilateral cavernous ICA stenosis. 4. Moderate proximal basilar artery stenosis, unchanged. Electronically Signed   By: Sebastian Ache M.D.   On: 07/03/2015 07:52   Mr Brain Wo Contrast  07/03/2015  CLINICAL DATA:  TIA. Episode of sudden onset left facial and arm numbness as well as nausea, vomiting, and diaphoresis lasting 20-30 minutes. Baseline left leg weakness from a prior stroke. Left carotid endarterectomy in 03/2013. EXAM: MRI HEAD WITHOUT CONTRAST MRA HEAD WITHOUT CONTRAST TECHNIQUE: Multiplanar, multiecho pulse sequences of the brain and surrounding structures were obtained without intravenous contrast. Angiographic images of the head were obtained using MRA technique without contrast. COMPARISON:  Head CT 07/02/2015. Head and neck CTA 03/27/2013. Head MRI/ MRA  03/26/2013. FINDINGS: MRI HEAD FINDINGS There is mildly increased signal in the posterior limb of the left internal capsule on axial diffusion images. The majority of this signal does not appear significantly changed from a 10/03/2010 brain MRI. A punctate focus of signal abnormality at the more anterior aspect of this region was not present in 2012 the was present on the more recent 2014 study. No clearly restricted diffusion is confirmed on the coronal sequence. This would also not explain left-sided numbness and therefore is not felt to reflect an acute infarct. No restricted diffusion is identified in the right cerebral hemisphere, brainstem, or cerebellum. There is no evidence of intracranial hemorrhage, mass, midline shift, or extra-axial fluid collection. There is mild generalized cerebral atrophy. A chronic infarct is again seen in the right paramedian pons. Orbits are unremarkable. Moderate left greater than right maxillary sinus mucosal thickening and minimal bilateral ethmoid air cell mucosal thickening are noted. The mastoid air cells are clear. Major intracranial vascular flow voids are preserved. MRA HEAD FINDINGS The visualized distal vertebral arteries are patent. Irregularity and mild narrowing of the right V4 segment is unchanged. The left PICA origin is patent. Right PICA is not well evaluated. Right AICA and bilateral SCA origins are patent. The basilar artery is patent with moderate stenosis proximally, unchanged. There is a fetal type origin of the right PCA with hypoplastic P1 segment. A left posterior communicating artery is not clearly identified. PCAs are patent without significant proximal stenosis. The internal carotid arteries are patent from skullbase to carotid termini, with improved flow related enhancement of the left ICA following interval endarterectomy. There is evidence of mild cavernous ICA narrowing bilaterally without definite interval change. ACAs and MCAs are patent with  mild branch vessel irregularity but no evidence of significant proximal stenosis or major branch vessel occlusion. No intracranial aneurysm is identified. IMPRESSION: 1. No definite acute infarct. Mild diffusion signal abnormality in the left internal capsule largely appears chronic and would not explain the left-sided numbness. 2. Chronic pontine infarct. 3. No large vessel occlusion. Mild bilateral cavernous ICA stenosis. 4. Moderate proximal basilar artery stenosis, unchanged. Electronically Signed   By: Sebastian Ache M.D.   On: 07/03/2015 07:52   Medications: I have reviewed the patient's current medications. Scheduled Meds: . aspirin  325 mg Oral Daily  . atorvastatin  80 mg Oral QHS  . clopidogrel  75 mg Oral Daily  . enoxaparin (LOVENOX) injection  40 mg Subcutaneous Daily  . insulin aspart  0-5 Units Subcutaneous QHS  . insulin aspart  0-9 Units Subcutaneous TID WC  . insulin aspart  10 Units  Subcutaneous TID AC  . insulin glargine  45 Units Subcutaneous Daily  . pantoprazole  40 mg Oral Daily  . sodium chloride  3 mL Intravenous Q12H   Continuous Infusions:   PRN Meds:.ondansetron **OR** ondansetron (ZOFRAN) IV Assessment/Plan: Principal Problem:   TIA (transient ischemic attack) Active Problems:   HTN (hypertension)   Hyperlipidemia   CAD (coronary artery disease) of artery bypass graft   H/O: CVA (cerebrovascular accident)   Diabetes mellitus type 2, uncontrolled, with complications (HCC)  Transient episode of weakness, rule out stroke: Patient presents with resolved episode of left sided numbness lasting 30 minutes. He has a history of HTN, HLD, DMII, CAD, and CVA, all raising his risk of repeat TIA/CVA. BP currently controlled, but last A1c in our system was 9.4% in 2014. He denies tobacco use. He is currently on ASA and Plavix and reports adherence. Ordered stroke workup . His MRI/MRA shows no definite acute infarct but chronic pontine infarcts, and no large vessel  occlusion. His troponins are WNL, and TSH normal.  Lipid panel was WNL. His echo was entirely unremarkable. His 2014 carotid dopplers show 80-99% left ICA stenosis but normal right carotid artery. Dopplers from yesterday were unremarkable.   -Consulted neurology- appreciate their recs- think weakness most likely due to dehydration and not TIA -a1c is 9.6- will mention it in discharge for outpatient follow up.  -continue aspirin and Plavix  -continue atorvastatin -holding losartan due to low-normal bp - PT/OT had no recs- cleared for discharge   N/V/Diarrhea most likely due to viral gastroenteriris: Patient reports 2 weeks of chronic N/V and diarrhea, stating symptoms started after running out of anti-reflux medication. Etiology unclear, but unlikely to be GERD alone. Differential remains broad. Patient has had recent colonoscopy.  His TSH is normal.  His cdiff is negative. Hemoglobin is stable 13--> 12.4  - no episodes of vomiting since being admitted -hgb is stable -protonix daily   -f/u HIV pending test   DMII:  Home regimen is Novolog 20 U qAC and Lantus 60 U qAM. Most recent a1c was 9.6 .  -Blood sugars are stable during the hospitalization - SSI - Novolog 10 U tid with meals and Lantus 45 U qAM  HTN: Well controlled.  -hold losartan on discharge until he sees his PCP   HLD: Atorvastatin 80  GERD: Protonix  Dispo: Disposition is deferred at this time, awaiting improvement of current medical problems.  Anticipated discharge in approximately 1 day(s).   The patient does have a current PCP (Harriett Pietro Cassis, MD) and does need an [facility] hospital follow-up appointment after discharge.  The patient does not have transportation limitations that hinder transportation to clinic appointments.  .Services Needed at time of discharge: Y = Yes, Blank = No PT:   OT:   RN:   Equipment:   Other:     LOS: 1 day   Deneise Lever, MD 07/04/2015, 10:48 AM

## 2015-07-04 NOTE — Discharge Summary (Signed)
Name: Shawn Hood MRN: [id number] DOB: 03-05-1956 60 y.o. PCP: Hyman Hopes, MD  Date of Admission: 07/02/2015 11:01 PM Date of Discharge: 07/04/2015 Attending Physician: Earl Lagos, MD  Discharge Diagnosis: 1. Viral gastroenteritis, stroke work up  Principal Problem:   TIA (transient ischemic attack) Active Problems:   HTN (hypertension)   Hyperlipidemia   CAD (coronary artery disease) of artery bypass graft   H/O: CVA (cerebrovascular accident)   Diabetes mellitus type 2, uncontrolled, with complications (HCC)  Discharge Medications:   Medication List    STOP taking these medications        losartan 25 MG tablet  Commonly known as:  COZAAR     quinapril 20 MG tablet  Commonly known as:  ACCUPRIL      TAKE these medications        aspirin 81 MG tablet  Take 1 tablet (81 mg total) by mouth daily.     atorvastatin 80 MG tablet  Commonly known as:  LIPITOR  Take 80 mg by mouth at bedtime.     clopidogrel 75 MG tablet  Commonly known as:  PLAVIX  Take 75 mg by mouth daily.     diphenhydrAMINE 25 mg capsule  Commonly known as:  BENADRYL  Take 1 capsule (25 mg total) by mouth every 6 (six) hours as needed for itching.     insulin aspart 100 UNIT/ML injection  Commonly known as:  novoLOG  Inject 20 Units into the skin 3 (three) times daily before meals.     insulin glargine 100 UNIT/ML injection  Commonly known as:  LANTUS  Inject 60 Units into the skin every morning.     metFORMIN 500 MG tablet  Commonly known as:  GLUCOPHAGE  Take 1,000 mg by mouth 2 (two) times daily with a meal.     nitroGLYCERIN 0.4 MG SL tablet  Commonly known as:  NITROSTAT  Place 1 tablet (0.4 mg total) under the tongue every 5 (five) minutes as needed for chest pain.     pantoprazole 40 MG tablet  Commonly known as:  PROTONIX  Take 1 tablet (40 mg total) by mouth daily.        Disposition and follow-up:   Shawn Hood was discharged from [facility] in Good condition.  At the hospital follow up visit please address:  1.  HTN: losartan was held on discharge due to low BP- please re-assess blood pressure and resume if needed Diabetes- his a1c is 9.3- his diabetes regimen may need to be modified GERD- given a 2 month supple of protonix- please re-assess and refill if needed   2.  Labs / imaging needed at time of follow-up:   3.  Pending labs/ test needing follow-up: HIV test   Follow-up Appointments:   Discharge Instructions:     Discharge Instructions    Diet - low sodium heart healthy    Complete by:  As directed      Discharge instructions    Complete by:  As directed   Please continue taking aspirin and plavix Please hold losartan until you see your PCP  Please follow up with your pcp about diabetes- your blood sugars in the last 3 months have been high- you may need to adjust some of your medicines. Continue your current insulin regimen until you see your pcp  We have prescribed you a medicine for acid reflux called pantoprazole for 60 days. Please follow up with pcp for further  refills.     Increase activity slowly    Complete by:  As directed            Consultations:    Procedures Performed:  Ct Head Wo Contrast  07/02/2015  CLINICAL DATA:  Left-sided numbness and weakness for 4 hours. EXAM: CT HEAD WITHOUT CONTRAST TECHNIQUE: Contiguous axial images were obtained from the base of the skull through the vertex without intravenous contrast. COMPARISON:  09/25/2014 FINDINGS: Ventricles normal in size and configuration. There are no parenchymal masses or mass effect, no evidence of an infarct, no extra-axial masses or abnormal fluid collections and no intracranial hemorrhage. Moderate mucosal thickening lines the maxillary sinuses with mild ethmoid sinus mucosal thickening. Clear mastoid air cells. IMPRESSION: 1. No intracranial abnormality. 2. Sinus disease as described. Electronically Signed   By: Amie Portland  M.D.   On: 07/02/2015 23:49   Mr [name] Wo Contrast  07/03/2015  CLINICAL DATA:  TIA. Episode of sudden onset left facial and arm numbness as well as nausea, vomiting, and diaphoresis lasting 20-30 minutes. Baseline left leg weakness from a prior stroke. Left carotid endarterectomy in 03/2013. EXAM: MRI HEAD WITHOUT CONTRAST MRA HEAD WITHOUT CONTRAST TECHNIQUE: Multiplanar, multiecho pulse sequences of the brain and surrounding structures were obtained without intravenous contrast. Angiographic images of the head were obtained using MRA technique without contrast. COMPARISON:  Head CT 07/02/2015. Head and neck CTA 03/27/2013. Head MRI/ MRA 03/26/2013. FINDINGS: MRI HEAD FINDINGS There is mildly increased signal in the posterior limb of the left internal capsule on axial diffusion images. The majority of this signal does not appear significantly changed from a 10/03/2010 brain MRI. A punctate focus of signal abnormality at the more anterior aspect of this region was not present in 2012 the was present on the more recent 2014 study. No clearly restricted diffusion is confirmed on the coronal sequence. This would also not explain left-sided numbness and therefore is not felt to reflect an acute infarct. No restricted diffusion is identified in the right cerebral hemisphere, brainstem, or cerebellum. There is no evidence of intracranial hemorrhage, mass, midline shift, or extra-axial fluid collection. There is mild generalized cerebral atrophy. A chronic infarct is again seen in the right paramedian pons. Orbits are unremarkable. Moderate left greater than right maxillary sinus mucosal thickening and minimal bilateral ethmoid air cell mucosal thickening are noted. The mastoid air cells are clear. Major intracranial vascular flow voids are preserved. MRA HEAD FINDINGS The visualized distal vertebral arteries are patent. Irregularity and mild narrowing of the right V4 segment is unchanged. The left PICA origin is  patent. Right PICA is not well evaluated. Right AICA and bilateral SCA origins are patent. The basilar artery is patent with moderate stenosis proximally, unchanged. There is a fetal type origin of the right PCA with hypoplastic P1 segment. A left posterior communicating artery is not clearly identified. PCAs are patent without significant proximal stenosis. The internal carotid arteries are patent from skullbase to carotid termini, with improved flow related enhancement of the left ICA following interval endarterectomy. There is evidence of mild cavernous ICA narrowing bilaterally without definite interval change. ACAs and MCAs are patent with mild branch vessel irregularity but no evidence of significant proximal stenosis or major branch vessel occlusion. No intracranial aneurysm is identified. IMPRESSION: 1. No definite acute infarct. Mild diffusion signal abnormality in the left internal capsule largely appears chronic and would not explain the left-sided numbness. 2. Chronic pontine infarct. 3. No large vessel occlusion. Mild bilateral cavernous  ICA stenosis. 4. Moderate proximal basilar artery stenosis, unchanged. Electronically Signed   By: Sebastian Ache M.D.   On: 07/03/2015 07:52   Mr Brain Wo Contrast  07/03/2015  CLINICAL DATA:  TIA. Episode of sudden onset left facial and arm numbness as well as nausea, vomiting, and diaphoresis lasting 20-30 minutes. Baseline left leg weakness from a prior stroke. Left carotid endarterectomy in 03/2013. EXAM: MRI HEAD WITHOUT CONTRAST MRA HEAD WITHOUT CONTRAST TECHNIQUE: Multiplanar, multiecho pulse sequences of the brain and surrounding structures were obtained without intravenous contrast. Angiographic images of the head were obtained using MRA technique without contrast. COMPARISON:  Head CT 07/02/2015. Head and neck CTA 03/27/2013. Head MRI/ MRA 03/26/2013. FINDINGS: MRI HEAD FINDINGS There is mildly increased signal in the posterior limb of the left internal  capsule on axial diffusion images. The majority of this signal does not appear significantly changed from a 10/03/2010 brain MRI. A punctate focus of signal abnormality at the more anterior aspect of this region was not present in 2012 the was present on the more recent 2014 study. No clearly restricted diffusion is confirmed on the coronal sequence. This would also not explain left-sided numbness and therefore is not felt to reflect an acute infarct. No restricted diffusion is identified in the right cerebral hemisphere, brainstem, or cerebellum. There is no evidence of intracranial hemorrhage, mass, midline shift, or extra-axial fluid collection. There is mild generalized cerebral atrophy. A chronic infarct is again seen in the right paramedian pons. Orbits are unremarkable. Moderate left greater than right maxillary sinus mucosal thickening and minimal bilateral ethmoid air cell mucosal thickening are noted. The mastoid air cells are clear. Major intracranial vascular flow voids are preserved. MRA HEAD FINDINGS The visualized distal vertebral arteries are patent. Irregularity and mild narrowing of the right V4 segment is unchanged. The left PICA origin is patent. Right PICA is not well evaluated. Right AICA and bilateral SCA origins are patent. The basilar artery is patent with moderate stenosis proximally, unchanged. There is a fetal type origin of the right PCA with hypoplastic P1 segment. A left posterior communicating artery is not clearly identified. PCAs are patent without significant proximal stenosis. The internal carotid arteries are patent from skullbase to carotid termini, with improved flow related enhancement of the left ICA following interval endarterectomy. There is evidence of mild cavernous ICA narrowing bilaterally without definite interval change. ACAs and MCAs are patent with mild branch vessel irregularity but no evidence of significant proximal stenosis or major branch vessel occlusion. No  intracranial aneurysm is identified. IMPRESSION: 1. No definite acute infarct. Mild diffusion signal abnormality in the left internal capsule largely appears chronic and would not explain the left-sided numbness. 2. Chronic pontine infarct. 3. No large vessel occlusion. Mild bilateral cavernous ICA stenosis. 4. Moderate proximal basilar artery stenosis, unchanged. Electronically Signed   By: Sebastian Ache M.D.   On: 07/03/2015 07:52    2D Echo:   Cardiac Cath:   Admission HPI: Mr. Qu is a 60 yo male with HTN, HLD, DMII, CAD s/p CABG (2002) and DES, and h/o CVA (chronic pontine and lacunar infarcts), presenting with left sided numbness. Patient reports sudden onset of numbness of left face and arm, as well as N/V and diaphoresis. He then took an ASA 81 mg and symptoms lasted 20-30 minutes, resolving spontaneously before arrival to the ED. At baseline, he has left leg weakness from his prior stroke. He denies CP, SOB, tobacco, alcohol, or drug use. He reports adherence to his  medication regimen, which includes ASA/Plavix.  He has also had 2 weeks of N/V and diarrhea. He states he will have vomiting and diarrhea, 3-4 episodes per day. Vomiting is worse at night and after eating. Last night was the first time he noticed blood in the vomit. His symptoms started after he ran out of a prescription medication of acid reflux. He denies sick contacts or recent antibiotic use. He denies hematochezia or melena. He had a colonoscopy 2 months ago that was normal.   He endorses runny nose and chronic leg swelling. He denies fever, chills, cough, orthopnea, dysphagia, or dysuria. He is able to walk 1.5 miles before he has chest pain relieved with rest.   Hospital Course by problem list: Principal Problem:   TIA (transient ischemic attack) Active Problems:   HTN (hypertension)   Hyperlipidemia   CAD (coronary artery disease) of artery bypass graft   H/O: CVA (cerebrovascular accident)    Diabetes mellitus type 2, uncontrolled, with complications (HCC)   1. Transient episode of weakness, rule out stroke: Patient presents with resolved episode of left sided numbness lasting 30 minutes. He has a history of HTN, HLD, DMII, CAD, and CVA, all raising his risk of repeat TIA/CVA. BP currently controlled, but last A1c in our system was 9.4% in 2014. He denies tobacco use. He is currently on ASA and Plavix and reports adherence. Ordered stroke workup . His MRI/MRA shows no definite acute infarct but chronic pontine infarcts, and no large vessel occlusion. His troponins are WNL, and TSH normal. Lipid panel was WNL. His echo was entirely unremarkable. His 2014 carotid dopplers show 80-99% left ICA stenosis but normal right carotid artery. Dopplers from yesterday were unremarkable. So in summary the stroke workup was negative. Neurology was consulted who thought the weakness was likely due to dehydration and not TIA. He was given IV fluids when in the ER. His a1c was 9.6. He is asked to continue aspirin, plavix, and atorvastatin on discharge. Losartan was held and can be resumed at pcp appt, and held due to low bp.  PT/OT had no recs- cleared for discharge  N/V/Diarrhea most likely due to viral gastroenteriris: Patient reports 2 weeks of chronic N/V and diarrhea, stating symptoms started after running out of anti-reflux medication. Etiology unclear, but unlikely to be GERD alone. Differential remains broad. Patient has had recent colonoscopy. His TSH is normal. His cdiff is negative. Hemoglobin is stable 13--> 12.4. His n/v/d had completely resolved on the day of discharge and he was feeling good- no hematemesis or vomiting.  He is discharged on daily protonix and follow up with pcp.   DMII: Home regimen is Novolog 20 U qAC and Lantus 60 U qAM. Most recent a1c was 9.6 . Blood sugars are stable during the hospitalization. He was kept on  SSI and  Novolog 10 U tid with meals and Lantus 45 U  qAM. Follow up with pcp on discharge.   HTN: Well controlled. held losartan on discharge until he sees his PCP  HLD: Atorvastatin 80  GERD: Protonix   Discharge Vitals:   BP 132/64 mmHg  Pulse 66  Temp(Src) 98 F (36.7 C) (Oral)  Resp 18  Ht 5\' 11"  (1.803 m)  Wt 218 lb 8 oz (99.111 kg)  BMI 30.49 kg/m2  SpO2 100%  Discharge Labs:  Results for orders placed or performed during the hospital encounter of 07/02/15 (from the past 24 hour(s))  C difficile quick screen w PCR reflex  Status: None   Collection Time: 07/03/15  2:56 PM  Result Value Ref Range   C Diff antigen NEGATIVE NEGATIVE   C Diff toxin NEGATIVE NEGATIVE   C Diff interpretation Negative for toxigenic C. difficile   Troponin I     Status: None   Collection Time: 07/03/15  3:15 PM  Result Value Ref Range   Troponin I <0.03 <0.031 ng/mL  Glucose, capillary     Status: Abnormal   Collection Time: 07/03/15  4:29 PM  Result Value Ref Range   Glucose-Capillary 139 (H) 65 - 99 mg/dL  Glucose, capillary     Status: Abnormal   Collection Time: 07/03/15 11:45 PM  Result Value Ref Range   Glucose-Capillary 146 (H) 65 - 99 mg/dL   Comment 1 Notify RN    Comment 2 Document in Chart   CBC     Status: Abnormal   Collection Time: 07/04/15  6:48 AM  Result Value Ref Range   WBC 6.5 4.0 - 10.5 K/uL   RBC 4.54 4.22 - 5.81 MIL/uL   Hemoglobin 12.4 (L) 13.0 - 17.0 g/dL   HCT 16.138.2 (L) 09.639.0 - 04.552.0 %   MCV 84.1 78.0 - 100.0 fL   MCH 27.3 26.0 - 34.0 pg   MCHC 32.5 30.0 - 36.0 g/dL   RDW 40.913.3 81.111.5 - 91.415.5 %   Platelets 253 150 - 400 K/uL  Glucose, capillary     Status: None   Collection Time: 07/04/15  6:51 AM  Result Value Ref Range   Glucose-Capillary 76 65 - 99 mg/dL   Comment 1 Notify RN    Comment 2 Document in Chart   Glucose, capillary     Status: Abnormal   Collection Time: 07/04/15 11:12 AM  Result Value Ref Range   Glucose-Capillary 121 (H) 65 - 99 mg/dL    Signed: Deneise LeverParth Cahterine Heinzel, MD 07/04/2015, 12:24 PM     Services Ordered on Discharge:  Equipment Ordered on Discharge:

## 2015-07-04 NOTE — Progress Notes (Signed)
Discharge orders received.  Discharge instructions and follow-up appointments reviewed with the patient and his wife.  VSS upon discharge.  IV removed and education complete.  Transported out via wheelchair.  All belongings sent with the patient.  Treena Cosman M, RN    

## 2015-07-04 NOTE — Evaluation (Signed)
Occupational Therapy Evaluation and Discharge Patient Details Name: Shawn Hood MRN: [id number] DOB: 1955/12/03 Today's Date: 07/04/2015    History of Present Illness Patient is a 60 yo male admitted 07/02/15 with increase in Lt UE/LE/facial weakness and paresthesia.  Symptoms resolved in ED per chart.  PMH:  HTN, HLD, DM, CAD, MI, CABG, CVA - pontine and lacunar infarcts with resultant mild Lt hemiparesis.   Clinical Impression   Pt is performing ADL and ADL transfers at an independent to modified independent level. No OT needs.    Follow Up Recommendations  No OT follow up    Equipment Recommendations  None recommended by OT    Recommendations for Other Services       Precautions / Restrictions Precautions Precautions: None Restrictions Weight Bearing Restrictions: No      Mobility Bed Mobility  Pt in chair.                Transfers Overall transfer level: Modified independent Equipment used: None             General transfer comment: increased time    Balance                                            ADL Overall ADL's : At baseline                                       General ADL Comments: Pt demonstrated ability to perform toilet, shower transfers, LB dressing, opening containers of meal tray.     Vision     Perception     Praxis      Pertinent Vitals/Pain Pain Assessment: No/denies pain     Hand Dominance Right   Extremity/Trunk Assessment Upper Extremity Assessment Upper Extremity Assessment: Overall WFL for tasks assessed (residual L distal weakness from prior CVA, but [facility] )   Lower Extremity Assessment Lower Extremity Assessment: Defer to PT evaluation       Communication Communication Communication: No difficulties   Cognition Arousal/Alertness: Awake/alert Behavior During Therapy: WFL for tasks assessed/performed Overall Cognitive Status: Within Functional Limits for tasks  assessed                     General Comments       Exercises       Shoulder Instructions      Home Living Family/patient expects to be discharged to:: Private residence Living Arrangements: Spouse/significant other;Children;Other relatives Available Help at Discharge: Family;Available 24 hours/day Type of Home: House Home Access: Level entry     Home Layout: Two level;Able to live on main level with bedroom/bathroom     Bathroom Shower/Tub: Producer, television/film/video: Standard     Home Equipment: None          Prior Functioning/Environment Level of Independence: Independent        Comments: Ambulates with no assistive device.  Independent with ADL's.  Patient drives.    OT Diagnosis: Generalized weakness   OT Problem List:     OT Treatment/Interventions:      OT Goals(Current goals can be found in the care plan section)    OT Frequency:     Barriers to D/C:  Co-evaluation              End of Session    Activity Tolerance:   Patient left: in chair;with call bell/phone within reach   Time: 0815-0829 OT Time Calculation (min): 14 min Charges:  OT General Charges $OT Visit: 1 Procedure OT Evaluation $OT Eval Low Complexity: 1 Procedure G-Codes:    Evern BioMayberry, Vannessa [place] 07/04/2015, 9:22 AM  [phone]

## 2017-01-08 ENCOUNTER — Emergency Department: Payer: Medicare Other

## 2017-01-08 ENCOUNTER — Emergency Department
Admission: EM | Admit: 2017-01-08 | Discharge: 2017-01-08 | Disposition: A | Discharge status: Home / Self Care | Payer: Medicare Other | Attending: Emergency Medicine | Admitting: Emergency Medicine

## 2017-01-08 DIAGNOSIS — Z87891 Personal history of nicotine dependence: Secondary | ICD-10-CM | POA: Diagnosis not present

## 2017-01-08 DIAGNOSIS — Z7902 Long term (current) use of antithrombotics/antiplatelets: Secondary | ICD-10-CM | POA: Diagnosis not present

## 2017-01-08 DIAGNOSIS — I10 Essential (primary) hypertension: Secondary | ICD-10-CM | POA: Diagnosis not present

## 2017-01-08 DIAGNOSIS — I739 Peripheral vascular disease, unspecified: Secondary | ICD-10-CM

## 2017-01-08 DIAGNOSIS — I2510 Atherosclerotic heart disease of native coronary artery without angina pectoris: Secondary | ICD-10-CM | POA: Insufficient documentation

## 2017-01-08 DIAGNOSIS — Z794 Long term (current) use of insulin: Secondary | ICD-10-CM | POA: Insufficient documentation

## 2017-01-08 DIAGNOSIS — M25531 Pain in right wrist: Secondary | ICD-10-CM | POA: Diagnosis present

## 2017-01-08 DIAGNOSIS — Z7982 Long term (current) use of aspirin: Secondary | ICD-10-CM | POA: Diagnosis not present

## 2017-01-08 DIAGNOSIS — E119 Type 2 diabetes mellitus without complications: Secondary | ICD-10-CM | POA: Insufficient documentation

## 2017-01-08 DIAGNOSIS — Z79899 Other long term (current) drug therapy: Secondary | ICD-10-CM | POA: Insufficient documentation

## 2017-01-08 MED ORDER — TRAMADOL HCL 50 MG PO TABS: 50.0000 mg | ORAL_TABLET | Freq: Four times a day (QID) | ORAL | 0 refills | Status: DC | PRN

## 2017-01-08 NOTE — ED Notes (Signed)
See triage note. Developed pain with some swelling to right wrist about 2 weeks ago w/o injury  Describes pain as "bones rubbing together"

## 2017-01-08 NOTE — Discharge Instructions (Signed)
Please follow up with the vascular specialist. Call tomorrow to schedule an appointment. See your primary  care provider for symptoms of concern. Return to the ER for symptoms that change or worsen if unable to schedule an appointment.

## 2017-01-08 NOTE — ED Triage Notes (Signed)
Pt c/o right wrist pain and swelling for the past 2 weeks, denies injury.

## 2017-01-08 NOTE — ED Provider Notes (Signed)
[facility] Emergency Department Provider Note ____________________________________________  Time seen: Approximately 4:08 PM  I have reviewed the triage vital signs and the nursing notes.   HISTORY  Chief Complaint Wrist Pain    HPI JERY HOLLERN is a 61 y.o. male who presents to the emergency department for evaluation of right wrist pain. He is right hand dominant. No specific injury. No pain similar in the past. He has occasional swelling. He has not taken any medications for this pain. He denies recent fever or illness.   Past Medical History:  Diagnosis Date  . Coronary artery disease    Hera. s/p 3-vessel CABG 2002. b NSTEMI 09/2010 with patent grafts, no obvious culprit, for med rx.  c. DES to SVG-OM, severe diffuse disease per [facility].  c.  DES to SVG to OM  9/13  . CVA (cerebral infarction)    on MRI 09/2010 - chronic infarct in the pons.  . Diabetes mellitus type 2, uncontrolled, with complications (HCC) DX: Age 62  . Hyperlipidemia   . Hypertension   . Myocardial infarction (HCC) 2002  . Shortness of breath   . Sinus bradycardia   . Stroke [facility]) 2006 ?   left arm & leg    Patient Active Problem List   Diagnosis Date Noted  . Occlusion and stenosis of carotid artery without mention of cerebral infarction 04/21/2013  . TIA (transient ischemic attack) 03/31/2013  . Bradycardia 03/29/2013  . Diabetes mellitus type 2, uncontrolled, with complications (HCC)   . Hyperlipidemia 02/27/2012  . CAD (coronary artery disease) of artery bypass graft 02/27/2012  . H/O: CVA (cerebrovascular accident) 02/27/2012  . Chest pain 10/03/2011  . HTN (hypertension) 10/03/2011    Past Surgical History:  Procedure Laterality Date  . CHOLECYSTECTOMY    . CORONARY ANGIOPLASTY WITH STENT PLACEMENT  2013  . CORONARY ARTERY BYPASS GRAFT  2002  . ENDARTERECTOMY Left 03/30/2013   Procedure: ENDARTERECTOMY CAROTID;  Surgeon: Larina Earthly, MD;  Location: [facility] OR;  Service:  Vascular;  Laterality: Left;  . LEFT HEART CATHETERIZATION WITH CORONARY/GRAFT ANGIOGRAM N/A 02/29/2012   Procedure: LEFT HEART CATHETERIZATION WITH Isabel Caprice;  Surgeon: [name], MD;  Location: [facility] CATH LAB;  Service: Cardiovascular;  Laterality: N/A;  . PERCUTANEOUS CORONARY STENT INTERVENTION (PCI-S)  02/29/2012   Procedure: PERCUTANEOUS CORONARY STENT INTERVENTION (PCI-S);  Surgeon: [name], MD;  Location: [facility] CATH LAB;  Service: Cardiovascular;;  . SHOULDER SURGERY Left     Prior to Admission medications   Medication Sig Start Date End Date Taking? Authorizing Provider  aspirin 81 MG tablet Take 1 tablet (81 mg total) by mouth daily. 03/31/13   Tyrone Nine, MD  atorvastatin (LIPITOR) 80 MG tablet Take 80 mg by mouth at bedtime.    [provider]  clopidogrel (PLAVIX) 75 MG tablet Take 75 mg by mouth daily.    [provider]  diphenhydrAMINE (BENADRYL) 25 mg capsule Take 1 capsule (25 mg total) by mouth every 6 (six) hours as needed for itching. 04/18/13   Derwood Kaplan, MD  insulin aspart (NOVOLOG) 100 UNIT/ML injection Inject 20 Units into the skin 3 (three) times daily before meals.     [provider]  insulin glargine (LANTUS) 100 UNIT/ML injection Inject 60 Units into the skin every morning.  10/04/11   Laveda Norman, MD  metFORMIN (GLUCOPHAGE) 500 MG tablet Take 1,000 mg by mouth 2 (two) times daily with a meal.     [provider]  nitroGLYCERIN (NITROSTAT) 0.4 MG SL tablet Place 1 tablet (0.4 mg total) under the tongue every 5 (five) minutes as needed for chest pain. 03/01/12 07/02/15  Dede Query, MD  pantoprazole (PROTONIX) 40 MG tablet Take 1 tablet (40 mg total) by mouth daily. 07/04/15   Deneise Lever, MD  traMADol (ULTRAM) 50 MG tablet Take 1 tablet (50 mg total) by mouth every 6 (six) hours as needed. 01/08/17   Chinita Pester, FNP    Allergies Accupril [quinapril hcl]  Family History  Problem Relation Age of Onset  .  Coronary artery disease Mother        Died at 23 of MI  . Diabetes Mother   . Hypertension Mother   . Coronary artery disease Brother        Died of CAD but had other medical problems  . Diabetes Sister   . Diabetes Brother   . Hypertension Brother     Social History Social History  Substance Use Topics  . Smoking status: Former Smoker    Packs/day: 1.50    Years: 10.00    Types: Cigarettes    Quit date: 06/26/1991  . Smokeless tobacco: Never Used     Comment: Quit 1993  . Alcohol use No    Review of Systems Constitutional: Negative for recent illness or injury. Cardiovascular: Positive for chronic, intermittent feeling of coolness   Respiratory: Negative for cough or shortness of breath. Musculoskeletal: Full, active ROM of the right wrist with a feeling of crepitus. Skin: Negative for abrasion, lesion, or wound.  Neurological: Intermittent, chronic decrease in sensation of both upper and lower extremities, which is chronic.  ____________________________________________   PHYSICAL EXAM:  VITAL SIGNS: ED Triage Vitals  Enc Vitals Group     BP 01/08/17 1541 (!) 148/75     Pulse Rate 01/08/17 1541 66     Resp 01/08/17 1541 16     Temp 01/08/17 1541 98.2 F (36.8 C)     Temp Source 01/08/17 1541 Oral     SpO2 01/08/17 1541 98 %     Weight 01/08/17 1541 215 lb (97.5 kg)     Height 01/08/17 1541 5\' 11"  (1.803 m)     Head Circumference --      Peak Flow --      Pain Score 01/08/17 1547 4     Pain Loc --      Pain Edu? --      Excl. in GC? --     Constitutional: Alert and oriented. Well appearing and in no acute distress. Eyes: Conjunctivae are clear without discharge or drainage.  Head: Atraumatic. Neck: Full, active ROM. Respiratory: Respirations even and unlabored. Musculoskeletal: Full, active ROM of the right hand, wrist, and fingers. Tenderness over the extensor tendon of the right thumb. Neurologic: Sharp and dull sensation intact over the right hand and  fingers.   Skin: Atraumatic.  Psychiatric: Behavior and affect are appropriate.   ____________________________________________   LABS (all labs ordered are listed, but only abnormal results are displayed)  Labs Reviewed - No data to display ____________________________________________  RADIOLOGY  X-rays of the right wrist is negative for acute bony abnormality. Of note, there is calcified peripheral vascular disease. ____________________________________________   PROCEDURES  Procedure(s) performed: Velcro wrist splint applied to the right wrist by ER tech. Patient neurovascularly intact post-application.  ____________________________________________   INITIAL IMPRESSION / ASSESSMENT AND PLAN / ED COURSE  CRIXUS MCAULAY is a 61 y.o. male presents to the emergency department  for treatment and evaluation of right wrist pain. No known injury. X-ray is negative for acute bony abnormality. He was placed in a Velcro cock-up splint and advised to wear for the next few days. He will be given tramadol to help with his pain. He was encouraged to follow up with his pain and vascular doctor due to the significance of the calcified peripheral vascular disease seen on the images of the wrist. He was encouraged to return to the emergency department for symptoms that change or worsen if he is unable schedule appointment.  Pertinent labs & imaging results that were available during my care of the patient were reviewed by me and considered in my medical decision making (see chart for details).  _________________________________________   FINAL CLINICAL IMPRESSION(S) / ED DIAGNOSES  Final diagnoses:  Right wrist pain  Peripheral vascular disease of extremity (HCC)    Discharge Medication List as of 01/08/2017  4:58 PM    START taking these medications   Details  traMADol (ULTRAM) 50 MG tablet Take 1 tablet (50 mg total) by mouth every 6 (six) hours as needed., Starting Tue 01/08/2017, Print         If controlled substance prescribed during this visit, 12 month history viewed on the [facility] prior to issuing an initial prescription for Schedule II or III opiod.    Chinita Pesterriplett, Kataleya Zaugg B, FNP 01/08/17 Babette Relic1958    Sharman CheekStafford, Phillip, MD 01/08/17 2325

## 2017-02-19 ENCOUNTER — Emergency Department
Admission: EM | Admit: 2017-02-19 | Discharge: 2017-02-19 | Disposition: A | Discharge status: Home / Self Care | Payer: Medicare Other | Attending: Emergency Medicine | Admitting: Emergency Medicine

## 2017-02-19 ENCOUNTER — Encounter: Payer: Self-pay | Admitting: Emergency Medicine

## 2017-02-19 DIAGNOSIS — Z951 Presence of aortocoronary bypass graft: Secondary | ICD-10-CM | POA: Diagnosis not present

## 2017-02-19 DIAGNOSIS — S161XXS Strain of muscle, fascia and tendon at neck level, sequela: Secondary | ICD-10-CM

## 2017-02-19 DIAGNOSIS — I2510 Atherosclerotic heart disease of native coronary artery without angina pectoris: Secondary | ICD-10-CM | POA: Insufficient documentation

## 2017-02-19 DIAGNOSIS — Z7982 Long term (current) use of aspirin: Secondary | ICD-10-CM | POA: Diagnosis not present

## 2017-02-19 DIAGNOSIS — Z794 Long term (current) use of insulin: Secondary | ICD-10-CM | POA: Insufficient documentation

## 2017-02-19 DIAGNOSIS — Z79899 Other long term (current) drug therapy: Secondary | ICD-10-CM | POA: Diagnosis not present

## 2017-02-19 DIAGNOSIS — E119 Type 2 diabetes mellitus without complications: Secondary | ICD-10-CM | POA: Insufficient documentation

## 2017-02-19 DIAGNOSIS — S161XXD Strain of muscle, fascia and tendon at neck level, subsequent encounter: Secondary | ICD-10-CM | POA: Insufficient documentation

## 2017-02-19 DIAGNOSIS — Z87891 Personal history of nicotine dependence: Secondary | ICD-10-CM | POA: Diagnosis not present

## 2017-02-19 DIAGNOSIS — I10 Essential (primary) hypertension: Secondary | ICD-10-CM | POA: Insufficient documentation

## 2017-02-19 MED ORDER — CYCLOBENZAPRINE HCL 5 MG PO TABS: 5.0000 mg | ORAL_TABLET | Freq: Three times a day (TID) | ORAL | 0 refills | Status: AC | PRN

## 2017-02-19 MED ORDER — TRAMADOL HCL 50 MG PO TABS: 50.0000 mg | ORAL_TABLET | Freq: Four times a day (QID) | ORAL | 0 refills | Status: AC | PRN
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
## 2017-02-19 NOTE — Discharge Instructions (Signed)
Take medication as prescribed. Return to emergency department if symptoms worsen and follow-up with PCP as needed.   °
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
## 2017-03-04 ENCOUNTER — Encounter: Payer: Self-pay | Admitting: Vascular Surgery

## 2017-03-05 ENCOUNTER — Other Ambulatory Visit (HOSPITAL_COMMUNITY): Payer: Medicare Other

## 2017-03-05 ENCOUNTER — Encounter: Payer: Self-pay | Admitting: Vascular Surgery

## 2017-03-05 ENCOUNTER — Ambulatory Visit (INDEPENDENT_AMBULATORY_CARE_PROVIDER_SITE_OTHER): Payer: Medicare Other | Admitting: Vascular Surgery

## 2017-03-05 VITALS — BP 144/83 | HR 60 | Temp 98.0°F | Resp 18 | Ht 71.0 in | Wt 216.0 lb

## 2017-03-05 DIAGNOSIS — G8929 Other chronic pain: Secondary | ICD-10-CM

## 2017-03-05 DIAGNOSIS — M25531 Pain in right wrist: Secondary | ICD-10-CM

## 2017-03-05 NOTE — Progress Notes (Signed)
[facility] [place]  Patient name: Shawn Hood MRN: [id number] DOB: 1955-09-24 Sex: male  REASON FOR CONSULT: Evaluation of right wrist pain and plain films showing calcification of his radial artery  HPI: Shawn Hood is a 61 y.o. male, who is in today to discuss plain films of his right wrist showing calcification of his radial artery. He is known to me from a prior left carotid endarterectomy for symptomatic carotid disease and October 2014. He reports no new neurologic deficits. He had failed to return to see [place] for duplex follow-up but did have a duplex at another facility in January 2017. He presents now with pain in his right wrist. This is in the anatomic snuff box region of his right wrist he does not recall any aggravating symptoms related to this. This is been present for several months and this had no progression. He specifically denies any difficulty in his fingers. No similar problem in his left hand or wrist.  Past Medical History:  Diagnosis Date  . Coronary artery disease    [place]. s/p 3-vessel CABG 2002. b NSTEMI 09/2010 with patent grafts, no obvious culprit, for med rx.  c. DES to SVG-OM, severe diffuse disease per [facility].  c.  DES to SVG to OM  9/13  . CVA (cerebral infarction)    on MRI 09/2010 - chronic infarct in the pons.  . Diabetes mellitus type 2, uncontrolled, with complications (HCC) DX: Age 66  . Hyperlipidemia   . Hypertension   . Myocardial infarction (HCC) 2002  . Shortness of breath   . Sinus bradycardia   . Stroke [facility]) 2006 ?   left arm & leg    Family History  Problem Relation Age of Onset  . Coronary artery disease Mother        Died at 44 of MI  . Diabetes Mother   . Hypertension Mother   . Heart disease Mother   . Coronary artery disease Brother        Died of CAD but had other medical problems  . Heart disease Brother   . Diabetes Sister   . Diabetes Brother   . Hypertension  Brother     SOCIAL HISTORY: Social History   Social History  . Marital status: Married    Spouse name: N/A  . Number of children: 4  . Years of education: 12th grade   Occupational History  . Disabled     since 2005, for his heart disease. Previously worked in [employer].   Social History Main Topics  . Smoking status: Former Smoker    Packs/day: 1.50    Years: 10.00    Types: Cigarettes    Quit date: 06/26/1991  . Smokeless tobacco: Never Used     Comment: Quit 1993  . Alcohol use No  . Drug use: No  . Sexual activity: Yes   Other Topics Concern  . 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      Allergies  Allergen Reactions  . Accupril [Quinapril Hcl]     Skin rash,  Lip swelling     Current Outpatient Prescriptions  Medication Sig Dispense Refill  . aspirin 81 MG tablet Take 1 tablet (81 mg total) by mouth daily. 30 tablet 0  . atorvastatin (LIPITOR) 80 MG tablet Take 80 mg by mouth at bedtime.    . clopidogrel (PLAVIX) 75 MG  tablet Take 75 mg by mouth daily.    . cyclobenzaprine (FLEXERIL) 5 MG tablet Take 1 tablet (5 mg total) by mouth 3 (three) times daily as needed. 20 tablet 0  . diphenhydrAMINE (BENADRYL) 25 mg capsule Take 1 capsule (25 mg total) by mouth every 6 (six) hours as needed for itching. 30 capsule 0  . HUMALOG MIX 75/25 KWIKPEN (75-25) 100 UNIT/ML Kwikpen     . insulin glargine (LANTUS) 100 UNIT/ML injection Inject 60 Units into the skin every morning.     . metFORMIN (GLUCOPHAGE) 500 MG tablet Take 1,000 mg by mouth 2 (two) times daily with a meal.     . pantoprazole (PROTONIX) 40 MG tablet Take 1 tablet (40 mg total) by mouth daily. 60 tablet 0  . traMADol (ULTRAM) 50 MG tablet Take 1 tablet (50 mg total) by mouth every 6 (six) hours as needed. 20 tablet 0  . insulin aspart (NOVOLOG) 100 UNIT/ML injection Inject 20 Units into the skin 3 (three) times daily before meals.     .  nitroGLYCERIN (NITROSTAT) 0.4 MG SL tablet Place 1 tablet (0.4 mg total) under the tongue every 5 (five) minutes as needed for chest pain. 30 tablet 0   No current facility-administered medications for this visit.     REVIEW OF SYSTEMS:  [X]  denotes positive finding, [ ]  denotes negative finding Cardiac  Comments:  Chest pain or chest pressure:    Shortness of breath upon exertion: x   Short of breath when lying flat:    Irregular heart rhythm:        Vascular    Pain in calf, thigh, or hip brought on by ambulation: x   Pain in feet at night that wakes you up from your sleep:     Blood clot in your veins:    Leg swelling:         Pulmonary    Oxygen at home:    Productive cough:     Wheezing:         Neurologic    Sudden weakness in arms or legs:  x   Sudden numbness in arms or legs:  x   Sudden onset of difficulty speaking or slurred speech:    Temporary loss of vision in one eye:     Problems with dizziness:         Gastrointestinal    Blood in stool:     Vomited blood:         Genitourinary    Burning when urinating:     Blood in urine:        Psychiatric    Major depression:         Hematologic    Bleeding problems:    Problems with blood clotting too easily:        Skin    Rashes or ulcers:        Constitutional    Fever or chills:      PHYSICAL EXAM: Vitals:   03/05/17 1039  BP: (!) 144/83  Pulse: 60  Resp: 18  Temp: 98 F (36.7 C)  TempSrc: Oral  SpO2: 92%  Weight: 216 lb (98 kg)  Height: 5\' 11"  (1.803 m)    GENERAL: The patient is a well-nourished male, in no acute distress. The vital signs are documented above. CARDIOVASCULAR: Left carotid incision is well-healed with no carotid bruits bilaterally. 2+ radial pulses bilaterally. PULMONARY: There is good air exchange  ABDOMEN: Soft and non-tender  MUSCULOSKELETAL:  There are no major deformities or cyanosis. NEUROLOGIC: No focal weakness or paresthesias are detected. SKIN: There are no  ulcers or rashes noted. PSYCHIATRIC: The patient has a normal affect.  DATA:  I reviewed his plain films of his right wrist. This does show extensive calcification in the right radial artery.  I listened with the hand-held pencil Doppler in this shows normal digital flow in all the fingers of his right hand with no evidence of ischemia  MEDICAL ISSUES: Right wrist pain for unknown sure etiology. Appears to be orthopedic in nature. I explained the patient and although he does have extensive calcification in the vessels at this is not causing any flow limitation and therefore is not causing any symptoms. He was reassured with this discussion and will see [place] on as-needed basis. Follow-up duplex in January 2017 revealed widely patent left endarterectomy and no evidence of right carotid stenosis. He will see [place] again on as-needed basis   Larina Earthly, MD [facility] Tel [phone] Pager [phone]

## 2017-04-05 ENCOUNTER — Observation Stay (HOSPITAL_BASED_OUTPATIENT_CLINIC_OR_DEPARTMENT_OTHER): Payer: Medicare Other

## 2017-04-05 ENCOUNTER — Encounter: Payer: Self-pay | Admitting: Nurse Practitioner

## 2017-04-05 ENCOUNTER — Emergency Department: Payer: Medicare Other

## 2017-04-05 ENCOUNTER — Inpatient Hospital Stay
Admission: EM | Admit: 2017-04-05 | Discharge: 2017-04-09 | DRG: 247 | Disposition: A | Discharge status: Home / Self Care | Payer: Medicare Other | Attending: Internal Medicine | Admitting: Internal Medicine

## 2017-04-05 DIAGNOSIS — I6522 Occlusion and stenosis of left carotid artery: Secondary | ICD-10-CM

## 2017-04-05 DIAGNOSIS — E785 Hyperlipidemia, unspecified: Secondary | ICD-10-CM | POA: Diagnosis present

## 2017-04-05 DIAGNOSIS — E1165 Type 2 diabetes mellitus with hyperglycemia: Secondary | ICD-10-CM

## 2017-04-05 DIAGNOSIS — I208 Other forms of angina pectoris: Secondary | ICD-10-CM

## 2017-04-05 DIAGNOSIS — I10 Essential (primary) hypertension: Secondary | ICD-10-CM

## 2017-04-05 DIAGNOSIS — I25700 Atherosclerosis of coronary artery bypass graft(s), unspecified, with unstable angina pectoris: Secondary | ICD-10-CM | POA: Diagnosis present

## 2017-04-05 DIAGNOSIS — Z7902 Long term (current) use of antithrombotics/antiplatelets: Secondary | ICD-10-CM

## 2017-04-05 DIAGNOSIS — I739 Peripheral vascular disease, unspecified: Secondary | ICD-10-CM

## 2017-04-05 DIAGNOSIS — Z888 Allergy status to other drugs, medicaments and biological substances status: Secondary | ICD-10-CM

## 2017-04-05 DIAGNOSIS — I25118 Atherosclerotic heart disease of native coronary artery with other forms of angina pectoris: Secondary | ICD-10-CM | POA: Diagnosis not present

## 2017-04-05 DIAGNOSIS — R748 Abnormal levels of other serum enzymes: Secondary | ICD-10-CM | POA: Diagnosis not present

## 2017-04-05 DIAGNOSIS — Z9049 Acquired absence of other specified parts of digestive tract: Secondary | ICD-10-CM

## 2017-04-05 DIAGNOSIS — Z8673 Personal history of transient ischemic attack (TIA), and cerebral infarction without residual deficits: Secondary | ICD-10-CM

## 2017-04-05 DIAGNOSIS — E1159 Type 2 diabetes mellitus with other circulatory complications: Secondary | ICD-10-CM | POA: Diagnosis not present

## 2017-04-05 DIAGNOSIS — R079 Chest pain, unspecified: Secondary | ICD-10-CM

## 2017-04-05 DIAGNOSIS — Z8249 Family history of ischemic heart disease and other diseases of the circulatory system: Secondary | ICD-10-CM

## 2017-04-05 DIAGNOSIS — Z833 Family history of diabetes mellitus: Secondary | ICD-10-CM

## 2017-04-05 DIAGNOSIS — I998 Other disorder of circulatory system: Secondary | ICD-10-CM | POA: Diagnosis present

## 2017-04-05 DIAGNOSIS — Z9889 Other specified postprocedural states: Secondary | ICD-10-CM

## 2017-04-05 DIAGNOSIS — I25110 Atherosclerotic heart disease of native coronary artery with unstable angina pectoris: Principal | ICD-10-CM | POA: Diagnosis present

## 2017-04-05 DIAGNOSIS — Z79899 Other long term (current) drug therapy: Secondary | ICD-10-CM

## 2017-04-05 DIAGNOSIS — Z7982 Long term (current) use of aspirin: Secondary | ICD-10-CM

## 2017-04-05 DIAGNOSIS — I252 Old myocardial infarction: Secondary | ICD-10-CM

## 2017-04-05 DIAGNOSIS — I6529 Occlusion and stenosis of unspecified carotid artery: Secondary | ICD-10-CM | POA: Diagnosis present

## 2017-04-05 DIAGNOSIS — Z794 Long term (current) use of insulin: Secondary | ICD-10-CM

## 2017-04-05 DIAGNOSIS — E118 Type 2 diabetes mellitus with unspecified complications: Secondary | ICD-10-CM | POA: Diagnosis present

## 2017-04-05 DIAGNOSIS — Z7984 Long term (current) use of oral hypoglycemic drugs: Secondary | ICD-10-CM

## 2017-04-05 DIAGNOSIS — Z955 Presence of coronary angioplasty implant and graft: Secondary | ICD-10-CM

## 2017-04-05 DIAGNOSIS — Z87891 Personal history of nicotine dependence: Secondary | ICD-10-CM

## 2017-04-05 DIAGNOSIS — I200 Unstable angina: Secondary | ICD-10-CM | POA: Diagnosis present

## 2017-04-05 HISTORY — DX: Peripheral vascular disease, unspecified: I73.9

## 2017-04-05 HISTORY — DX: Disorder of arteries and arterioles, unspecified: I77.9

## 2017-04-05 LAB — NM MYOCAR MULTI W/SPECT W/WALL MOTION / EF
CHL CUP MPHR: 161 {beats}/min
CHL CUP NUCLEAR SDS: 10
Estimated workload: 1 METS
Exercise duration (min): 0 min
Exercise duration (sec): 0 s
LVDIAVOL: 129 mL (ref 62–150)
LVSYSVOL: 44 mL
Peak HR: 97 {beats}/min
Percent HR: 60 %
Rest HR: 51 {beats}/min
SRS: 16
SSS: 23
TID: 0.94

## 2017-04-05 LAB — COMPREHENSIVE METABOLIC PANEL
ALT: 10 U/L — AB (ref 17–63)
AST: 17 U/L (ref 15–41)
Albumin: 3.8 g/dL (ref 3.5–5.0)
Alkaline Phosphatase: 72 U/L (ref 38–126)
Anion gap: 9 (ref 5–15)
BILIRUBIN TOTAL: 0.7 mg/dL (ref 0.3–1.2)
BUN: 17 mg/dL (ref 6–20)
CO2: 25 mmol/L (ref 22–32)
CREATININE: 1.06 mg/dL (ref 0.61–1.24)
Calcium: 9.2 mg/dL (ref 8.9–10.3)
Chloride: 103 mmol/L (ref 101–111)
GFR calc Af Amer: >60 mL/min (ref >60)
GFR calc non Af Amer: >60 mL/min (ref >60)
Glucose, Bld: 241 mg/dL — ABNORMAL HIGH (ref 65–99)
Potassium: 3.5 mmol/L (ref 3.5–5.1)
Sodium: 137 mmol/L (ref 135–145)
TOTAL PROTEIN: 8 g/dL (ref 6.5–8.1)

## 2017-04-05 LAB — BASIC METABOLIC PANEL
ANION GAP: 6 (ref 5–15)
BUN: 17 mg/dL (ref 6–20)
CO2: 27 mmol/L (ref 22–32)
Calcium: 9.1 mg/dL (ref 8.9–10.3)
Chloride: 104 mmol/L (ref 101–111)
Creatinine, Ser: 1.07 mg/dL (ref 0.61–1.24)
GLUCOSE: 200 mg/dL — AB (ref 65–99)
POTASSIUM: 3.7 mmol/L (ref 3.5–5.1)
SODIUM: 137 mmol/L (ref 135–145)

## 2017-04-05 LAB — CBC
HEMATOCRIT: 39 % — AB (ref 40.0–52.0)
HEMATOCRIT: 39.1 % — AB (ref 40.0–52.0)
HEMOGLOBIN: 13.1 g/dL (ref 13.0–18.0)
Hemoglobin: 13.3 g/dL (ref 13.0–18.0)
MCH: 28.3 pg (ref 26.0–34.0)
MCH: 28 pg (ref 26.0–34.0)
MCHC: 34.2 g/dL (ref 32.0–36.0)
MCHC: 33.6 g/dL (ref 32.0–36.0)
MCV: 82.6 fL (ref 80.0–100.0)
MCV: 83.5 fL (ref 80.0–100.0)
Platelets: 227 10*3/uL (ref 150–440)
Platelets: 216 10*3/uL (ref 150–440)
RBC: 4.72 MIL/uL (ref 4.40–5.90)
RBC: 4.69 MIL/uL (ref 4.40–5.90)
RDW: 13.8 % (ref 11.5–14.5)
RDW: 13.8 % (ref 11.5–14.5)
WBC: 5.9 10*3/uL (ref 3.8–10.6)
WBC: 5.6 10*3/uL (ref 3.8–10.6)

## 2017-04-05 LAB — LIPID PANEL
CHOL/HDL RATIO: 6.2 ratio
Cholesterol: 206 mg/dL — ABNORMAL HIGH (ref 0–200)
HDL: 33 mg/dL — AB (ref >40)
LDL Cholesterol: 163 mg/dL — ABNORMAL HIGH (ref 0–99)
TRIGLYCERIDES: 48 mg/dL (ref <150)
VLDL: 10 mg/dL (ref 0–40)

## 2017-04-05 LAB — HEMOGLOBIN A1C
HEMOGLOBIN A1C: 8.7 % — AB (ref 4.8–5.6)
Mean Plasma Glucose: 202.99 mg/dL

## 2017-04-05 LAB — GLUCOSE, CAPILLARY
GLUCOSE-CAPILLARY: 217 mg/dL — AB (ref 65–99)
GLUCOSE-CAPILLARY: 166 mg/dL — AB (ref 65–99)
GLUCOSE-CAPILLARY: 232 mg/dL — AB (ref 65–99)
Glucose-Capillary: 200 mg/dL — ABNORMAL HIGH (ref 65–99)
Glucose-Capillary: 84 mg/dL (ref 65–99)

## 2017-04-05 LAB — TROPONIN I
TROPONIN I: 0.04 ng/mL — AB (ref <0.03)
Troponin I: 0.06 ng/mL (ref <0.03)
Troponin I: 0.04 ng/mL (ref <0.03)

## 2017-04-05 MED ORDER — NITROGLYCERIN 0.4 MG SL SUBL: 0.4000 mg | SUBLINGUAL_TABLET | SUBLINGUAL | Status: DC | PRN

## 2017-04-05 MED ORDER — ATORVASTATIN CALCIUM 20 MG PO TABS
80.0000 mg | ORAL_TABLET | Freq: Every day | ORAL | Status: DC
  Administered 2017-04-05 – 2017-04-08 (×4): 80 mg via ORAL
  Filled 2017-04-05 (×4): qty 4

## 2017-04-05 MED ORDER — ASPIRIN 81 MG PO CHEW: 324.0000 mg | CHEWABLE_TABLET | ORAL | Status: AC

## 2017-04-05 MED ORDER — CLOPIDOGREL BISULFATE 75 MG PO TABS
75.0000 mg | ORAL_TABLET | Freq: Every day | ORAL | Status: DC
  Administered 2017-04-05 – 2017-04-09 (×5): 75 mg via ORAL
  Filled 2017-04-05 (×5): qty 1

## 2017-04-05 MED ORDER — ASPIRIN 300 MG RE SUPP: 300.0000 mg | RECTAL | Status: AC

## 2017-04-05 MED ORDER — ASPIRIN 81 MG PO CHEW
243.0000 mg | CHEWABLE_TABLET | Freq: Once | ORAL | Status: AC
  Administered 2017-04-05: 243 mg via ORAL
  Filled 2017-04-05: qty 3

## 2017-04-05 MED ORDER — ACETAMINOPHEN 325 MG PO TABS: 650.0000 mg | ORAL_TABLET | ORAL | Status: DC | PRN

## 2017-04-05 MED ORDER — INSULIN ASPART 100 UNIT/ML ~~LOC~~ SOLN
0.0000 [IU] | Freq: Three times a day (TID) | SUBCUTANEOUS | Status: DC
  Administered 2017-04-06: 5 [IU] via SUBCUTANEOUS
  Administered 2017-04-07 – 2017-04-09 (×2): 2 [IU] via SUBCUTANEOUS
  Filled 2017-04-05 (×3): qty 1

## 2017-04-05 MED ORDER — ENOXAPARIN SODIUM 40 MG/0.4ML ~~LOC~~ SOLN
40.0000 mg | SUBCUTANEOUS | Status: DC
  Administered 2017-04-05 – 2017-04-06 (×2): 40 mg via SUBCUTANEOUS
  Filled 2017-04-05 (×2): qty 0.4

## 2017-04-05 MED ORDER — NITROGLYCERIN 0.4 MG SL SUBL
0.4000 mg | SUBLINGUAL_TABLET | SUBLINGUAL | Status: DC | PRN
  Administered 2017-04-05: 0.4 mg via SUBLINGUAL
  Filled 2017-04-05: qty 1

## 2017-04-05 MED ORDER — ASPIRIN EC 81 MG PO TBEC
81.0000 mg | DELAYED_RELEASE_TABLET | Freq: Every day | ORAL | Status: DC
  Administered 2017-04-06 – 2017-04-09 (×3): 81 mg via ORAL
  Filled 2017-04-05 (×3): qty 1

## 2017-04-05 MED ORDER — NITROGLYCERIN 0.4 MG SL SUBL
SUBLINGUAL_TABLET | SUBLINGUAL | Status: AC
  Filled 2017-04-05: qty 1

## 2017-04-05 MED ORDER — INSULIN GLARGINE 100 UNIT/ML ~~LOC~~ SOLN
60.0000 [IU] | SUBCUTANEOUS | Status: DC
  Administered 2017-04-05 – 2017-04-07 (×3): 60 [IU] via SUBCUTANEOUS
  Filled 2017-04-05 (×5): qty 0.6

## 2017-04-05 MED ORDER — INSULIN ASPART 100 UNIT/ML ~~LOC~~ SOLN
0.0000 [IU] | Freq: Every day | SUBCUTANEOUS | Status: DC
  Administered 2017-04-05 – 2017-04-06 (×2): 2 [IU] via SUBCUTANEOUS
  Filled 2017-04-05 (×2): qty 1

## 2017-04-05 MED ORDER — PANTOPRAZOLE SODIUM 40 MG PO TBEC
40.0000 mg | DELAYED_RELEASE_TABLET | Freq: Every day | ORAL | Status: DC
Start: 2017-04-05 — End: 2017-04-09
  Administered 2017-04-05 – 2017-04-09 (×5): 40 mg via ORAL
  Filled 2017-04-05 (×5): qty 1

## 2017-04-05 MED ORDER — FENTANYL CITRATE (PF) 100 MCG/2ML IJ SOLN
50.0000 ug | INTRAMUSCULAR | Status: DC | PRN
  Administered 2017-04-05: 50 ug via INTRAVENOUS
  Filled 2017-04-05: qty 2

## 2017-04-05 MED ORDER — MORPHINE SULFATE (PF) 2 MG/ML IV SOLN
2.0000 mg | INTRAVENOUS | Status: DC | PRN
Start: 2017-04-05 — End: 2017-04-09

## 2017-04-05 MED ORDER — ONDANSETRON HCL 4 MG/2ML IJ SOLN: 4.0000 mg | Freq: Four times a day (QID) | INTRAMUSCULAR | Status: DC | PRN

## 2017-04-05 MED ORDER — ISOSORBIDE MONONITRATE ER 30 MG PO TB24
30.0000 mg | ORAL_TABLET | Freq: Every day | ORAL | Status: DC
  Administered 2017-04-06 – 2017-04-09 (×4): 30 mg via ORAL
  Filled 2017-04-05 (×4): qty 1

## 2017-04-05 MED ORDER — REGADENOSON 0.4 MG/5ML IV SOLN
0.4000 mg | Freq: Once | INTRAVENOUS | Status: AC
  Administered 2017-04-05: 0.4 mg via INTRAVENOUS
  Filled 2017-04-05: qty 5

## 2017-04-05 MED ORDER — CYCLOBENZAPRINE HCL 10 MG PO TABS: 5.0000 mg | ORAL_TABLET | Freq: Three times a day (TID) | ORAL | Status: DC | PRN

## 2017-04-05 MED ORDER — INSULIN ASPART 100 UNIT/ML ~~LOC~~ SOLN
10.0000 [IU] | Freq: Three times a day (TID) | SUBCUTANEOUS | Status: DC
  Administered 2017-04-06: 10 [IU] via SUBCUTANEOUS
  Filled 2017-04-05: qty 1

## 2017-04-05 MED ORDER — TECHNETIUM TC 99M TETROFOSMIN IV KIT
30.0000 | PACK | Freq: Once | INTRAVENOUS | Status: AC | PRN
  Administered 2017-04-05: 32.707 via INTRAVENOUS

## 2017-04-05 MED ORDER — INSULIN ASPART 100 UNIT/ML ~~LOC~~ SOLN
20.0000 [IU] | Freq: Three times a day (TID) | SUBCUTANEOUS | Status: DC
  Administered 2017-04-05: 20 [IU] via SUBCUTANEOUS
  Filled 2017-04-05: qty 1

## 2017-04-05 MED ORDER — DIPHENHYDRAMINE HCL 25 MG PO CAPS: 25.0000 mg | ORAL_CAPSULE | Freq: Four times a day (QID) | ORAL | Status: DC | PRN

## 2017-04-05 MED ORDER — ACETAMINOPHEN 500 MG PO TABS
1000.0000 mg | ORAL_TABLET | Freq: Once | ORAL | Status: AC
  Administered 2017-04-05: 1000 mg via ORAL
  Filled 2017-04-05: qty 2

## 2017-04-05 MED ORDER — TECHNETIUM TC 99M TETROFOSMIN IV KIT
13.1130 | PACK | Freq: Once | INTRAVENOUS | Status: AC | PRN
  Administered 2017-04-05: 13.113 via INTRAVENOUS

## 2017-04-05 NOTE — Care Management Obs Status (Signed)
MEDICARE OBSERVATION STATUS NOTIFICATION   Patient Details  Name: Shawn Hood MRN: [id number] Date of Birth: 1956/03/16   Medicare Observation Status Notification Given:  Yes Notice signed, one given to patient and the other to HIM for scanning   Eber Hong, RN 04/05/2017, 10:05 AM

## 2017-04-05 NOTE — Progress Notes (Signed)
Inpatient Diabetes Program Recommendations  AACE/ADA: New Consensus Statement on Inpatient Glycemic Control (2015)  Target Ranges:  Prepandial:   less than 140 mg/dL      Peak postprandial:   less than 180 mg/dL (1-2 hours)      Critically ill patients:  140 - 180 mg/dL   Lab Results  Component Value Date   GLUCAP 166 (H) 04/05/2017   HGBA1C 9.6 (H) 07/03/2015    Review of Glycemic Control  Results for FERRY, MATTHIS (MRN [id number]) as of 04/05/2017 15:04  Ref. Range 04/05/2017 05:38 04/05/2017 07:50 04/05/2017 12:13  Glucose-Capillary Latest Ref Range: 65 - 99 mg/dL 409 (H) 811 (H) 914 (H)    Diabetes history: Type 2- A1C pending Outpatient Diabetes medications: From medical record- Humalog 75/25 (unknown dose or if he's taking it), Novolog 20 units tid, Lantus 60 units qam, Glucophage  bid  Current orders for Inpatient glycemic control: Lantus 60 units qam, Novolog 20 units tid  Inpatient Diabetes Program Recommendations: Patient is currently NPO- please place order for Novolog 20 units tid on hold and add Novolog 0-15 units q4h   Once he is eating again, consider Novolog 10 units tid with meals and Novolog 0-15 units tid, Novolog 0-5 units qhs.    Consider decreasing Lantus to 40 units qam- increase as needed as blood sugars warrant.   Text page to Dr. Amado Coe with recommendations.   Susette Racer, RN, BA, MHA, CDE Diabetes Coordinator Inpatient Diabetes Program  [phone] (Team Pager) [phone] [facility] Office) 04/05/2017 3:08 PM

## 2017-04-05 NOTE — ED Triage Notes (Signed)
Pt in with co chest pain that started tonight, radiates to left arm. Hx of bypass surgery and stent placement.

## 2017-04-05 NOTE — Consult Note (Signed)
Cardiology Consult    Patient ID: Shawn Hood MRN: [id number], DOB/AGE: Jun 27, 1955   Admit date: 04/05/2017 Date of Consult: 04/05/2017  Primary Physician: Hyman Hopes, MD Primary Cardiologist: formerly J. Hochrein, MD  Requesting Provider: Elveria Royals  Patient Profile    Shawn Hood is a 61 y.o. male with a history of Coronary artery disease status post CABG, carotid arterial disease status post left CEA, stroke, hypertension, hyperlipidemia, and diabetes, who is being seen today for the evaluation of progressive angina at the request of Dr. Amado Coe.  Past Medical History   Past Medical History:  Diagnosis Date  . Carotid arterial disease (HCC)    a. 03/2013 s/p L CEA;  b. 06/2015 Carotid U/S: RICA 1-39%, LICA patent.  . Coronary artery disease    a. 2002 s/p CABG x 3 (LIMA->LAD, VG->OM3, VG->AM;  b. 09/2010 NSTEMI/Cath: no culprits->Med Rx.  c. 02/2012 Cath/PCI: LM min irregs, LAD 143m, LCX 100, RCA 40p, 15m, AM 99, LIMA->LAD ok w/ sev diff apical LAD dzs (stable), VG->OM3 99 (3.0x12 Promus DES), VG->AM 25p, 60p.  . CVA (cerebral infarction)    a. on MRI 09/2010 - chronic infarct in the pons.  . Diabetes mellitus type 2, uncontrolled, with complications (HCC) DX: Age 39  . Hyperlipidemia   . Hypertension   . Myocardial infarction (HCC) 2002  . Sinus bradycardia   . Stroke [facility])    a. ? 2006;  b. on MRI 09/2010 - chronic infarct in the pons.    Past Surgical History:  Procedure Laterality Date  . CHOLECYSTECTOMY    . CORONARY ANGIOPLASTY WITH STENT PLACEMENT  2013  . CORONARY ARTERY BYPASS GRAFT  2002  . ENDARTERECTOMY Left 03/30/2013   Procedure: ENDARTERECTOMY CAROTID;  Surgeon: Larina Earthly, MD;  Location: [facility] OR;  Service: Vascular;  Laterality: Left;  . heart bypass    . LEFT HEART CATHETERIZATION WITH CORONARY/GRAFT ANGIOGRAM N/A 02/29/2012   Procedure: LEFT HEART CATHETERIZATION WITH Isabel Caprice;  Surgeon: [name], MD;  Location[facility] C[facility] LAB;   Service: Cardiovascular;  Laterality: N/A;  . PERCUTANEOUS CORONARY STENT INTERVENTION (PCI-S)  02/29/2012   Procedure: PERCUTANEOUS CORONARY STENT INTERVENTION (PCI-S);  Surgeon: [name], MD;  Location: [facility] CATH LAB;  Service: Cardiovascular;;  . SHOULDER SURGERY Left      Allergies  Allergies  Allergen Reactions  . Accupril [Quinapril Hcl]     Skin rash,  Lip swelling     History of Present Illness    61 year old male with the above complex past medical history including coronary artery disease status post MI and CABG 3 in 2002 followed by non-STEMI in 2012. There were no culprits for intervention at that time. In September 2013, he had repeat catheterization in the setting of chest pain with finding of severe stenosis in the vein graft to the third obtuse marginal. This was successfully treated with a drug-eluting stent. He has not been seen in cardiology clinic since 2014. Other history includes hypertension, hyperlipidemia, diabetes, carotid arterial disease status post left carotid endarterectomy, and stroke. Patient is followed closely by primary care. He walks regularly and was in his usual state of health until about a month and a half ago, when he began to experience progressive exertional substernal chest pressure associated with dyspnea. This typically begins after walking about a half mile. Symptoms will last up to 10-15 minutes, and resolved with rest. He has not had any resting angina. He saw his primary care provider earlier this week  and reported symptoms and was advised to follow-up with cardiology. Further, he was advised if he has recurrent symptoms he should present to the emergency department. Patient had recurrent exertional angina on October 11 presented to the ED. Here, ECG was nonacute. Initial troponin was minimally elevated at 0.04. Subsequent troponin 0.06. He was admitted for further evaluation. He is currently chest pain-free.  Inpatient Medications    .  aspirin  324 mg Oral NOW   Or  . aspirin  300 mg Rectal NOW  . [START ON 04/06/2017] aspirin EC  81 mg Oral Daily  . atorvastatin  80 mg Oral QHS  . clopidogrel  75 mg Oral Daily  . enoxaparin (LOVENOX) injection  40 mg Subcutaneous Q24H  . insulin aspart  20 Units Subcutaneous TID AC  . insulin glargine  60 Units Subcutaneous BH-q7a  . pantoprazole  40 mg Oral Daily  . regadenoson  0.4 mg Intravenous Once    Family History    Family History  Problem Relation Age of Onset  . Coronary artery disease Mother        Died at 67 of MI  . Diabetes Mother   . Hypertension Mother   . Heart disease Mother   . Coronary artery disease Brother        Died of CAD but had other medical problems  . Heart disease Brother   . Diabetes Sister   . Diabetes Brother   . Hypertension Brother     Social History    Social History   Social History  . Marital status: Married    Spouse name: N/A  . Number of children: 4  . Years of education: 12th grade   Occupational History  . Disabled     since 2005, for his heart disease. Previously worked in [employer].   Social History Main Topics  . Smoking status: Former Smoker    Packs/day: 1.50    Years: 10.00    Types: Cigarettes    Quit date: 06/26/1991  . Smokeless tobacco: Never Used     Comment: Quit 1993  . Alcohol use No  . Drug use: No  . Sexual activity: Yes   Other Topics Concern  . 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       Review of Systems    General:  No chills, fever, night sweats or weight changes.  Cardiovascular:  +++ Exertional chest pain associated with dyspnea, no edema, orthopnea, palpitations, paroxysmal nocturnal dyspnea. Dermatological: No rash, lesions/masses Respiratory: No cough, +++ exertional dyspnea Urologic: No hematuria, dysuria Abdominal:   No nausea, vomiting, diarrhea, bright red blood per rectum, melena, or  hematemesis Neurologic:  No visual changes, wkns, changes in mental status. All other systems reviewed and are otherwise negative except as noted above.  Physical Exam    Blood pressure 140/82, pulse (!) 58, temperature 97.7 F (36.5 C), temperature source Oral, resp. rate 20, height  (1.803 m), weight 210 lb 8 oz (95.5 kg), SpO2 99 %.  General: Pleasant, NAD Psych: Normal affect. Neuro: Alert and oriented X 3. Moves all extremities spontaneously. HEENT: Normal  Neck: Supple without bruits or JVD. Lungs:  Resp regular and unlabored, CTA. Heart: RRR no s3, s4, or murmurs. Abdomen: Soft, non-tender, non-distended, BS + x 4.  Extremities: No clubbing, cyanosis or edema. DP/PT/Radials 2+ and equal bilaterally.  Labs     Recent Labs  04/05/17 0203 04/05/17 0751  TROPONINI 0.04* 0.06*   Lab Results  Component Value Date   WBC 5.6 04/05/2017   HGB 13.1 04/05/2017   HCT 39.1 (L) 04/05/2017   MCV 83.5 04/05/2017   PLT 216 04/05/2017    Recent Labs Lab 04/05/17 0203 04/05/17 0751  NA 137 137  K 3.5 3.7  CL 103 104  CO2 25 27  BUN 17 17  CREATININE 1.06 1.07  CALCIUM 9.2 9.1  PROT 8.0  --   BILITOT 0.7  --   ALKPHOS 72  --   ALT 10*  --   AST 17  --   GLUCOSE 241* 200*   Lab Results  Component Value Date   CHOL 206 (H) 04/05/2017   HDL 33 (L) 04/05/2017   LDLCALC 163 (H) 04/05/2017   TRIG 48 04/05/2017     Radiology Studies    Dg Chest 2 View  Result Date: 04/05/2017 CLINICAL DATA:  Chest pain radiating into the left arm, onset tonight. EXAM: CHEST  2 VIEW COMPARISON:  03/26/2013 FINDINGS: The lungs are clear. Pulmonary vasculature is normal. Prior sternotomy and CABG. Hilar, mediastinal and cardiac contours are unchanged. No pleural effusions. IMPRESSION: No active cardiopulmonary disease. Electronically Signed   By: Ellery Plunk M.D.   On: 04/05/2017 01:50    ECG & Cardiac Imaging    Regular sinus rhythm, 65, left axis deviation, lateral ST  segment depression with T-wave inversion. No acute changes.  Assessment & Plan    1. Unstable angina/coronary artery disease: Patient with prior history of coronary artery disease status post coronary artery bypass grafting in 2002 was an stenting of the vein graft to the OM 3 and 2013. He has done well since then has been walking regularly but beginning about a month and a half ago, he began to experience progressive exertional substernal chest pressure associated with dyspnea. He had recurrent symptoms on October 11, prompting him to present to the emergency department. Here, ECG is similar to older ECGs while troponin is minimally elevated at 0.04  0.06. He is currently chest pain-free. We have gone over options for management with him and his wife and they would prefer an initial noninvasive evaluation. As such, we will pursue a Lexiscan Myoview this afternoon. We would have a low threshold to pursue diagnostic catheterization for any abnormalities or recurrent chest pain. Check echo. Continue aspirin, statin, Plavix. I will add long-acting nitrate. No beta blocker setting of baseline bradycardia.  2. Essential hypertension: He is not on any antihypertensives at home. Prior angioedema with ACE inhibitor use. I'm adding nitrate as outlined above.  3. Hyperlipidemia: He is reportedly taking Lipitor 80 mg daily at home. His LDL was 163 this morning. Question compliance.  4. Type 2 diabetes mellitus: Per internal medicine.  5.  Carotid arterial dzs:  S/p L CEA.  Stable anatomy on 06/2015 u/s.  Followed by Dr. Arbie Cookey in [place].  Signed, Nicolasa Ducking, NP 04/05/2017, 12:23 PM  For questions or updates, please contact   Please consult [email or web address] for contact info under Cardiology/STEMI.

## 2017-04-05 NOTE — H&P (Signed)
[facility] - [place] at [facility]   PATIENT NAME: Shawn Hood    MR#:  [id number]  DATE OF BIRTH:  [birth year]  DATE OF ADMISSION:  04/05/2017  PRIMARY CARE PHYSICIAN: Shawn Hopes, MD   REQUESTING/REFERRING PHYSICIAN:   CHIEF COMPLAINT:   Chief Complaint  Patient presents with  . Chest Pain    HISTORY OF PRESENT ILLNESS: Shawn Hood  is a 61 y.o. male with a known history of Coronary artery disease, CABG, CVA, diabetes mellitus type 2, hyperlipidemia, hypertension presented to the emergency room with chest pain. Patient noticed chest pain yesterday evening after power went out at home. Pain is located in the left side of the chest and radiating to the left arm. When patient got the pain he was watching TV and subsequently the power went out. The pain was similar to his previous heart attacks. It was sharp in nature 6 out of 10 on a scale of 1-10. No complaints of any nausea vomiting and diaphoresis. Compliant with his medication.  PAST MEDICAL HISTORY:   Past Medical History:  Diagnosis Date  . Coronary artery disease    Hera. s/p 3-vessel CABG 2002. b NSTEMI 09/2010 with patent grafts, no obvious culprit, for med rx.  c. DES to SVG-OM, severe diffuse disease per [facility].  c.  DES to SVG to OM  9/13  . CVA (cerebral infarction)    on MRI 09/2010 - chronic infarct in the pons.  . Diabetes mellitus type 2, uncontrolled, with complications (HCC) DX: Age 25  . Hyperlipidemia   . Hypertension   . Myocardial infarction (HCC) 2002  . Shortness of breath   . Sinus bradycardia   . Stroke [facility]) 2006 ?   left arm & leg    PAST SURGICAL HISTORY: Past Surgical History:  Procedure Laterality Date  . CHOLECYSTECTOMY    . CORONARY ANGIOPLASTY WITH STENT PLACEMENT  2013  . CORONARY ARTERY BYPASS GRAFT  2002  . ENDARTERECTOMY Left 03/30/2013   Procedure: ENDARTERECTOMY CAROTID;  Surgeon: Larina Earthly, MD;  Location: [facility] [place] OR;  Service: Vascular;  Laterality: Left;  .  heart bypass    . LEFT HEART CATHETERIZATION WITH CORONARY/GRAFT ANGIOGRAM N/A 02/29/2012   Procedure: LEFT HEART CATHETERIZATION WITH Isabel Caprice;  Surgeon: [name], MD;  Location: [facility] CATH LAB;  Service: Cardiovascular;  Laterality: N/A;  . PERCUTANEOUS CORONARY STENT INTERVENTION (PCI-S)  02/29/2012   Procedure: PERCUTANEOUS CORONARY STENT INTERVENTION (PCI-S);  Surgeon: [name], MD;  Location: [facility] CATH LAB;  Service: Cardiovascular;;  . SHOULDER SURGERY Left     SOCIAL HISTORY:  Social History  Substance Use Topics  . Smoking status: Former Smoker    Packs/day: 1.50    Years: 10.00    Types: Cigarettes    Quit date: 06/26/1991  . Smokeless tobacco: Never Used     Comment: Quit 1993  . Alcohol use No    FAMILY HISTORY:  Family History  Problem Relation Age of Onset  . Coronary artery disease Mother        Died at 74 of MI  . Diabetes Mother   . Hypertension Mother   . Heart disease Mother   . Coronary artery disease Brother        Died of CAD but had other medical problems  . Heart disease Brother   . Diabetes Sister   . Diabetes Brother   . Hypertension Brother     DRUG ALLERGIES:  Allergies  Allergen Reactions  .  Accupril [Quinapril Hcl]     Skin rash,  Lip swelling     REVIEW OF SYSTEMS:   CONSTITUTIONAL: No fever, fatigue or weakness.  EYES: No blurred or double vision.  EARS, NOSE, AND THROAT: No tinnitus or ear pain.  RESPIRATORY: No cough, shortness of breath, wheezing or hemoptysis.  CARDIOVASCULAR: Has chest pain,  No orthopnea, edema.  GASTROINTESTINAL: No nausea, vomiting, diarrhea or abdominal pain.  GENITOURINARY: No dysuria, hematuria.  ENDOCRINE: No polyuria, nocturia,  HEMATOLOGY: No anemia, easy bruising or bleeding SKIN: No rash or lesion. MUSCULOSKELETAL: No joint pain or arthritis.   NEUROLOGIC: No tingling, numbness, weakness.  PSYCHIATRY: No anxiety or depression.   MEDICATIONS AT HOME:  Prior to Admission  medications   Medication Sig Start Date End Date Taking? Authorizing Provider  aspirin 81 MG tablet Take 1 tablet (81 mg total) by mouth daily. 03/31/13   Tyrone Nine, MD  atorvastatin (LIPITOR) 80 MG tablet Take 80 mg by mouth at bedtime.    [provider]  clopidogrel (PLAVIX) 75 MG tablet Take 75 mg by mouth daily.    [provider]  cyclobenzaprine (FLEXERIL) 5 MG tablet Take 1 tablet (5 mg total) by mouth 3 (three) times daily as needed. 02/19/17   Little, Traci M, PA-C  diphenhydrAMINE (BENADRYL) 25 mg capsule Take 1 capsule (25 mg total) by mouth Hood 6 (six) hours as needed for itching. 04/18/13   Derwood Kaplan, MD  HUMALOG MIX 75/25 KWIKPEN (75-25) 100 UNIT/ML Stephanie Coup  02/26/17   [provider]  insulin aspart (NOVOLOG) 100 UNIT/ML injection Inject 20 Units into the skin 3 (three) times daily before meals.     [provider]  insulin glargine (LANTUS) 100 UNIT/ML injection Inject 60 Units into the skin Hood morning.  10/04/11   Laveda Norman, MD  metFORMIN (GLUCOPHAGE) 500 MG tablet Take 1,000 mg by mouth 2 (two) times daily with a meal.     [provider]  nitroGLYCERIN (NITROSTAT) 0.4 MG SL tablet Place 1 tablet (0.4 mg total) under the tongue Hood 5 (five) minutes as needed for chest pain. 03/01/12 07/02/15  Dede Query, MD  pantoprazole (PROTONIX) 40 MG tablet Take 1 tablet (40 mg total) by mouth daily. 07/04/15   Deneise Lever, MD  traMADol (ULTRAM) 50 MG tablet Take 1 tablet (50 mg total) by mouth Hood 6 (six) hours as needed. 02/19/17 02/19/18  Little, Traci M, PA-C      PHYSICAL EXAMINATION:   VITAL SIGNS: Blood pressure 136/82, pulse (!) 59, temperature 98 F (36.7 C), temperature source Oral, resp. rate 16, height  (1.803 m), weight 96.6 kg (213 lb), SpO2 96 %.  GENERAL:  60 y.o.-year-old patient lying in the bed with no acute distress.  EYES: Pupils equal, round, reactive to light and accommodation. No scleral icterus.  Extraocular muscles intact.  HEENT: Head atraumatic, normocephalic. Oropharynx and nasopharynx clear.  NECK:  Supple, no jugular venous distention. No thyroid enlargement, no tenderness.  LUNGS: Normal breath sounds bilaterally, no wheezing, rales,rhonchi or crepitation. No use of accessory muscles of respiration.  Midline scar noted CARDIOVASCULAR: S1, S2 normal. No murmurs, rubs, or gallops.  ABDOMEN: Soft, nontender, nondistended. Bowel sounds present. No organomegaly or mass.  EXTREMITIES: No pedal edema, cyanosis, or clubbing.  NEUROLOGIC: Cranial nerves II through XII are intact. Muscle strength 5/5 in all extremities. Sensation intact. Gait not checked.  PSYCHIATRIC: The patient is alert and oriented x 3.  SKIN: No obvious rash, lesion, or  ulcer.   LABORATORY PANEL:   CBC  Recent Labs Lab 04/05/17 0203  WBC 5.9  HGB 13.3  HCT 39.0*  PLT 227  MCV 82.6  MCH 28.3  MCHC 34.2  RDW 13.8   ------------------------------------------------------------------------------------------------------------------  Chemistries   Recent Labs Lab 04/05/17 0203  NA 137  K 3.5  CL 103  CO2 25  GLUCOSE 241*  BUN 17  CREATININE 1.06  CALCIUM 9.2  AST 17  ALT 10*  ALKPHOS 72  BILITOT 0.7   ------------------------------------------------------------------------------------------------------------------ estimated creatinine clearance is 88.9 mL/min (by C-G formula based on SCr of 1.06 mg/dL). ------------------------------------------------------------------------------------------------------------------ No results for input(s): TSH, T4TOTAL, T3FREE, THYROIDAB in the last 72 hours.  Invalid input(s): FREET3   Coagulation profile No results for input(s): INR, PROTIME in the last 168 hours. ------------------------------------------------------------------------------------------------------------------- No results for input(s): DDIMER in the last 72  hours. -------------------------------------------------------------------------------------------------------------------  Cardiac Enzymes  Recent Labs Lab 04/05/17 0203  TROPONINI 0.04*   ------------------------------------------------------------------------------------------------------------------ Invalid input(s): POCBNP  ---------------------------------------------------------------------------------------------------------------  Urinalysis    Component Value Date/Time   COLORURINE YELLOW 02/27/2012 1146   APPEARANCEUR CLEAR 02/27/2012 1146   LABSPEC 1.041 (H) 02/27/2012 1146   PHURINE 5.5 02/27/2012 1146   GLUCOSEU >1000 (A) 02/27/2012 1146   HGBUR NEGATIVE 02/27/2012 1146   BILIRUBINUR NEGATIVE 02/27/2012 1146   KETONESUR NEGATIVE 02/27/2012 1146   PROTEINUR NEGATIVE 02/27/2012 1146   UROBILINOGEN 1.0 02/27/2012 1146   NITRITE NEGATIVE 02/27/2012 1146   LEUKOCYTESUR NEGATIVE 02/27/2012 1146     RADIOLOGY: Dg Chest 2 View  Result Date: 04/05/2017 CLINICAL DATA:  Chest pain radiating into the left arm, onset tonight. EXAM: CHEST  2 VIEW COMPARISON:  03/26/2013 FINDINGS: The lungs are clear. Pulmonary vasculature is normal. Prior sternotomy and CABG. Hilar, mediastinal and cardiac contours are unchanged. No pleural effusions. IMPRESSION: No active cardiopulmonary disease. Electronically Signed   By: Ellery Plunk M.D.   On: 04/05/2017 01:50    EKG: Orders placed or performed during the hospital encounter of 04/05/17  . ED EKG  . ED EKG    IMPRESSION AND PLAN: 61 year old male patient with history of coronary artery disease, CABG, hypertension, hyperlipidemia, type 2 diabetes mellitus presented to the emergency room with chest pain.  Admitting diagnosis 1. Unstable angina 2. Coronary artery disease 3. Hypertension 4. Hyperlipidemia 5. Type 2 diabetes mellitus Treatment plan Admit patient to telemetry observation bed Start patient on aspirin and  Plavix DVT prophylaxis with subcutaneous Lovenox 40 MG daily Cycle troponin and cardiac stress test Check echocardiogram Cardiology consultation When necessary nitrates for chest pain  All the records are reviewed and case discussed with ED provider. Management plans discussed with the patient, family and they are in agreement.  CODE STATUS:FULL CODE Code Status History    Date Active Date Inactive Code Status Order ID Comments User Context   07/03/2015  2:44 AM 07/04/2015  4:50 PM Full Code [id number]  Aldean Baker, MD ED   03/30/2013  5:51 PM 03/31/2013  7:18 PM Full Code [id number]  Dara Lords, PA-C Inpatient   03/26/2013  7:50 PM 03/30/2013  5:51 PM Full Code [id number]  Ozella Rocks, MD ED   09/04/2012 11:35 AM 09/05/2012  6:55 PM Full Code [id number]  Priscella Mann, DO ED   09/04/2012 10:49 AM 09/04/2012 11:35 AM Full Code [id number]  Priscella Mann, DO ED       TOTAL TIME TAKING CARE OF THIS PATIENT: 51 minutes.    Ihor Austin M.D on 04/05/2017 at 5:31 AM  Between 7am to 6pm - Pager - [phone]  After 6pm go to [email or web address] - password [email or web address] [facility]  [place] [facility]  Office  [phone]  CC: Primary care physician; Shawn Hopes, MD

## 2017-04-05 NOTE — ED Provider Notes (Signed)
[facility] Emergency Department Provider Note    None    (approximate)  I have reviewed the triage vital signs and the nursing notes.   HISTORY  Chief Complaint Chest Pain    HPI Shawn Hood is a 61 y.o. male with a history of CAD status post three-vessel CABG in 2002 as well as CVA hypertension hyperlipidemia and diabetes presents with chief complaint of chest pressure radiating to his left arm. States he was just watching TV. He feels identical to his previous heart attacks. States pain is moderate in severity without any radiation through to his back. No diaphoresis. No fevers. No vomiting. No nausea. The compliant with his medications. He takes aspirin and Plavix.   Past Medical History:  Diagnosis Date  . Coronary artery disease    Hera. s/p 3-vessel CABG 2002. b NSTEMI 09/2010 with patent grafts, no obvious culprit, for med rx.  c. DES to SVG-OM, severe diffuse disease per [facility].  c.  DES to SVG to OM  9/13  . CVA (cerebral infarction)    on MRI 09/2010 - chronic infarct in the pons.  . Diabetes mellitus type 2, uncontrolled, with complications (HCC) DX: Age 78  . Hyperlipidemia   . Hypertension   . Myocardial infarction (HCC) 2002  . Shortness of breath   . Sinus bradycardia   . Stroke [facility]) 2006 ?   left arm & leg   Family History  Problem Relation Age of Onset  . Coronary artery disease Mother        Died at 49 of MI  . Diabetes Mother   . Hypertension Mother   . Heart disease Mother   . Coronary artery disease Brother        Died of CAD but had other medical problems  . Heart disease Brother   . Diabetes Sister   . Diabetes Brother   . Hypertension Brother    Past Surgical History:  Procedure Laterality Date  . CHOLECYSTECTOMY    . CORONARY ANGIOPLASTY WITH STENT PLACEMENT  2013  . CORONARY ARTERY BYPASS GRAFT  2002  . ENDARTERECTOMY Left 03/30/2013   Procedure: ENDARTERECTOMY CAROTID;  Surgeon: Larina Earthly, MD;  Location: [facility]  OR;  Service: Vascular;  Laterality: Left;  . heart bypass    . LEFT HEART CATHETERIZATION WITH CORONARY/GRAFT ANGIOGRAM N/A 02/29/2012   Procedure: LEFT HEART CATHETERIZATION WITH Isabel Caprice;  Surgeon: [name], MD;  Location: [facility] CATH LAB;  Service: Cardiovascular;  Laterality: N/A;  . PERCUTANEOUS CORONARY STENT INTERVENTION (PCI-S)  02/29/2012   Procedure: PERCUTANEOUS CORONARY STENT INTERVENTION (PCI-S);  Surgeon: [name], MD;  Location: [facility] CATH LAB;  Service: Cardiovascular;;  . SHOULDER SURGERY Left    Patient Active Problem List   Diagnosis Date Noted  . Occlusion and stenosis of carotid artery without mention of cerebral infarction 04/21/2013  . TIA (transient ischemic attack) 03/31/2013  . Bradycardia 03/29/2013  . Diabetes mellitus type 2, uncontrolled, with complications (HCC)   . Hyperlipidemia 02/27/2012  . CAD (coronary artery disease) of artery bypass graft 02/27/2012  . H/O: CVA (cerebrovascular accident) 02/27/2012  . Chest pain 10/03/2011  . HTN (hypertension) 10/03/2011      Prior to Admission medications   Medication Sig Start Date End Date Taking? Authorizing Provider  aspirin 81 MG tablet Take 1 tablet (81 mg total) by mouth daily. 03/31/13   Tyrone Nine, MD  atorvastatin (LIPITOR) 80 MG tablet Take 80 mg by mouth at bedtime.  [provider]  clopidogrel (PLAVIX) 75 MG tablet Take 75 mg by mouth daily.    [provider]  cyclobenzaprine (FLEXERIL) 5 MG tablet Take 1 tablet (5 mg total) by mouth 3 (three) times daily as needed. 02/19/17   Little, Traci M, PA-C  diphenhydrAMINE (BENADRYL) 25 mg capsule Take 1 capsule (25 mg total) by mouth every 6 (six) hours as needed for itching. 04/18/13   Derwood Kaplan, MD  HUMALOG MIX 75/25 KWIKPEN (75-25) 100 UNIT/ML Stephanie Coup  02/26/17   [provider]  insulin aspart (NOVOLOG) 100 UNIT/ML injection Inject 20 Units into the skin 3 (three) times daily before meals.      [provider]  insulin glargine (LANTUS) 100 UNIT/ML injection Inject 60 Units into the skin every morning.  10/04/11   Laveda Norman, MD  metFORMIN (GLUCOPHAGE) 500 MG tablet Take 1,000 mg by mouth 2 (two) times daily with a meal.     [provider]  nitroGLYCERIN (NITROSTAT) 0.4 MG SL tablet Place 1 tablet (0.4 mg total) under the tongue every 5 (five) minutes as needed for chest pain. 03/01/12 07/02/15  Dede Query, MD  pantoprazole (PROTONIX) 40 MG tablet Take 1 tablet (40 mg total) by mouth daily. 07/04/15   Deneise Lever, MD  traMADol (ULTRAM) 50 MG tablet Take 1 tablet (50 mg total) by mouth every 6 (six) hours as needed. 02/19/17 02/19/18  Little, Jordan Likes, PA-C    Allergies Accupril Otilio Connors hcl]    Social History Social History  Substance Use Topics  . Smoking status: Former Smoker    Packs/day: 1.50    Years: 10.00    Types: Cigarettes    Quit date: 06/26/1991  . Smokeless tobacco: Never Used     Comment: Quit 1993  . Alcohol use No    Review of Systems Patient denies headaches, rhinorrhea, blurry vision, numbness, shortness of breath, chest pain, edema, cough, abdominal pain, nausea, vomiting, diarrhea, dysuria, fevers, rashes or hallucinations unless otherwise stated above in HPI. ____________________________________________   PHYSICAL EXAM:  VITAL SIGNS: Vitals:   04/05/17 0200 04/05/17 0230  BP: (!) 177/82 130/69  Pulse: 61 (!) 56  Resp: (!) 23 (!) 21  Temp:    SpO2: 98% 97%    Constitutional: Alert and oriented. Well appearing and in no acute distress. Eyes: Conjunctivae are normal.  Head: Atraumatic. Nose: No congestion/rhinnorhea. Mouth/Throat: Mucous membranes are moist.   Neck: No stridor. Painless ROM.  Cardiovascular: Normal rate, regular rhythm. Grossly normal heart sounds.  Good peripheral circulation. Respiratory: Normal respiratory effort.  No retractions. Lungs CTAB. Gastrointestinal: Soft and nontender. No distention. No abdominal  bruits. No CVA tenderness. Genitourinary:  Musculoskeletal: No lower extremity tenderness nor edema.  No joint effusions. Neurologic:  Normal speech and language. No gross focal neurologic deficits are appreciated. No facial droop Skin:  Skin is warm, dry and intact. No rash noted. Psychiatric: Mood and affect are normal. Speech and behavior are normal.  ____________________________________________   LABS (all labs ordered are listed, but only abnormal results are displayed)  Results for orders placed or performed during the hospital encounter of 04/05/17 (from the past 24 hour(s))  CBC     Status: Abnormal   Collection Time: 04/05/17  2:03 AM  Result Value Ref Range   WBC 5.9 3.8 - 10.6 K/uL   RBC 4.72 4.40 - 5.90 MIL/uL   Hemoglobin 13.3 13.0 - 18.0 g/dL   HCT 16.1 (L) 09.6 - 04.5 %   MCV 82.6 80.0 -  100.0 fL   MCH 28.3 26.0 - 34.0 pg   MCHC 34.2 32.0 - 36.0 g/dL   RDW 16.1 09.6 - 04.5 %   Platelets 227 150 - 440 K/uL  Comprehensive metabolic panel     Status: Abnormal   Collection Time: 04/05/17  2:03 AM  Result Value Ref Range   Sodium 137 135 - 145 mmol/L   Potassium 3.5 3.5 - 5.1 mmol/L   Chloride 103 101 - 111 mmol/L   CO2 25 22 - 32 mmol/L   Glucose, Bld 241 (H) 65 - 99 mg/dL   BUN 17 6 - 20 mg/dL   Creatinine, Ser 4.09 0.61 - 1.24 mg/dL   Calcium 9.2 8.9 - 81.1 mg/dL   Total Protein 8.0 6.5 - 8.1 g/dL   Albumin 3.8 3.5 - 5.0 g/dL   AST 17 15 - 41 U/L   ALT 10 (L) 17 - 63 U/L   Alkaline Phosphatase 72 38 - 126 U/L   Total Bilirubin 0.7 0.3 - 1.2 mg/dL   GFR calc non Af Amer >60 >60 mL/min   GFR calc Af Amer >60 >60 mL/min   Anion gap 9 5 - 15  Troponin I     Status: Abnormal   Collection Time: 04/05/17  2:03 AM  Result Value Ref Range   Troponin I 0.04 (HH) <0.03 ng/mL   ____________________________________________  EKG My review and personal interpretation at Time: 1:29   Indication: chest pain  Rate: 65  Rhythm: sinus Axis: normmal Other: st  depressions in I, AVL, with elevation in aVR and V1, not significantly changed from previous EKG. ____________________________________________  RADIOLOGY  I personally reviewed all radiographic images ordered to evaluate for the above acute complaints and reviewed radiology reports and findings.  These findings were personally discussed with the patient.  Please see medical record for radiology report.  ____________________________________________   PROCEDURES  Procedure(s) performed:  Procedures    Critical Care performed: no ____________________________________________   INITIAL IMPRESSION / ASSESSMENT AND PLAN / ED COURSE  Pertinent labs & imaging results that were available during my care of the patient were reviewed by me and considered in my medical decision making (see chart for details).  DDX: ACS, pericarditis, esophagitis, boerhaaves, pe, dissection, pna, bronchitis, costochondritis    Shawn Hood is a 61 y.o. who presents to the ED with chest pain and pressure consistent with typical chest pains arrives to the ER afebrile with mild hypertension but no hypoxia. It does not seem clinical consistent with dissection. Denies any pleuritic chest pain and is hypoxic to have lower suspicion for pulmonary embolism.  Will give Nitro, asa, and The patient will be placed on continuous pulse oximetry and telemetry for monitoring.  Laboratory evaluation will be sent to evaluate for the above complaints.     ----------------------------------------- 4:02 AM on 04/05/2017 -----------------------------------------  Chest x-ray is unrevealing. 's pain now described as minimal after nitroglycerin and aspirin. Is also given IV fentanyl.   Based on his cardiac history and subtle EKG changes with description of atypical chest pains would benefit from further observation in the hospital for further chest pain workup.      ____________________________________________   FINAL  CLINICAL IMPRESSION(S) / ED DIAGNOSES  Final diagnoses:  Chest pain, unspecified type      NEW MEDICATIONS STARTED DURING THIS VISIT:  New Prescriptions   No medications on file     Note:  This document was prepared using Dragon voice recognition software and may include  unintentional dictation errors.    Willy Eddy, MD 04/05/17 [id number]

## 2017-04-05 NOTE — Progress Notes (Signed)
Stress test  Shows lateral wall ischemia, large size  Moderate to severe intensity, EF 44%,  Given anginal sx and ischemia on stress test, We will schedule a cardiac cath on Monday with Dr. Kirke Corin  Signed, Dossie Arbour, MD, Ph.D [facility]

## 2017-04-06 ENCOUNTER — Observation Stay (HOSPITAL_COMMUNITY)
Admit: 2017-04-06 | Discharge: 2017-04-06 | Disposition: A | Discharge status: Home / Self Care | Payer: Medicare Other | Attending: Internal Medicine | Admitting: Internal Medicine

## 2017-04-06 DIAGNOSIS — I25110 Atherosclerotic heart disease of native coronary artery with unstable angina pectoris: Secondary | ICD-10-CM | POA: Diagnosis present

## 2017-04-06 DIAGNOSIS — Z8249 Family history of ischemic heart disease and other diseases of the circulatory system: Secondary | ICD-10-CM | POA: Diagnosis not present

## 2017-04-06 DIAGNOSIS — R079 Chest pain, unspecified: Secondary | ICD-10-CM | POA: Diagnosis present

## 2017-04-06 DIAGNOSIS — E118 Type 2 diabetes mellitus with unspecified complications: Secondary | ICD-10-CM | POA: Diagnosis present

## 2017-04-06 DIAGNOSIS — Z7902 Long term (current) use of antithrombotics/antiplatelets: Secondary | ICD-10-CM | POA: Diagnosis not present

## 2017-04-06 DIAGNOSIS — Z9889 Other specified postprocedural states: Secondary | ICD-10-CM | POA: Diagnosis not present

## 2017-04-06 DIAGNOSIS — I200 Unstable angina: Secondary | ICD-10-CM | POA: Diagnosis not present

## 2017-04-06 DIAGNOSIS — Z7982 Long term (current) use of aspirin: Secondary | ICD-10-CM | POA: Diagnosis not present

## 2017-04-06 DIAGNOSIS — I25700 Atherosclerosis of coronary artery bypass graft(s), unspecified, with unstable angina pectoris: Secondary | ICD-10-CM | POA: Diagnosis present

## 2017-04-06 DIAGNOSIS — Z833 Family history of diabetes mellitus: Secondary | ICD-10-CM | POA: Diagnosis not present

## 2017-04-06 DIAGNOSIS — Z8673 Personal history of transient ischemic attack (TIA), and cerebral infarction without residual deficits: Secondary | ICD-10-CM | POA: Diagnosis not present

## 2017-04-06 DIAGNOSIS — Z79899 Other long term (current) drug therapy: Secondary | ICD-10-CM | POA: Diagnosis not present

## 2017-04-06 DIAGNOSIS — Z87891 Personal history of nicotine dependence: Secondary | ICD-10-CM | POA: Diagnosis not present

## 2017-04-06 DIAGNOSIS — I6529 Occlusion and stenosis of unspecified carotid artery: Secondary | ICD-10-CM | POA: Diagnosis present

## 2017-04-06 DIAGNOSIS — Z7984 Long term (current) use of oral hypoglycemic drugs: Secondary | ICD-10-CM | POA: Diagnosis not present

## 2017-04-06 DIAGNOSIS — Z9049 Acquired absence of other specified parts of digestive tract: Secondary | ICD-10-CM | POA: Diagnosis not present

## 2017-04-06 DIAGNOSIS — Z888 Allergy status to other drugs, medicaments and biological substances status: Secondary | ICD-10-CM | POA: Diagnosis not present

## 2017-04-06 DIAGNOSIS — I998 Other disorder of circulatory system: Secondary | ICD-10-CM | POA: Diagnosis present

## 2017-04-06 DIAGNOSIS — E785 Hyperlipidemia, unspecified: Secondary | ICD-10-CM | POA: Diagnosis not present

## 2017-04-06 DIAGNOSIS — Z794 Long term (current) use of insulin: Secondary | ICD-10-CM | POA: Diagnosis not present

## 2017-04-06 DIAGNOSIS — I340 Nonrheumatic mitral (valve) insufficiency: Secondary | ICD-10-CM

## 2017-04-06 DIAGNOSIS — I252 Old myocardial infarction: Secondary | ICD-10-CM | POA: Diagnosis not present

## 2017-04-06 DIAGNOSIS — Z955 Presence of coronary angioplasty implant and graft: Secondary | ICD-10-CM | POA: Diagnosis not present

## 2017-04-06 DIAGNOSIS — I10 Essential (primary) hypertension: Secondary | ICD-10-CM | POA: Diagnosis present

## 2017-04-06 LAB — CBC
HEMATOCRIT: 40.4 % (ref 40.0–52.0)
HEMOGLOBIN: 13.5 g/dL (ref 13.0–18.0)
MCH: 28 pg (ref 26.0–34.0)
MCHC: 33.4 g/dL (ref 32.0–36.0)
MCV: 83.7 fL (ref 80.0–100.0)
Platelets: 240 10*3/uL (ref 150–440)
RBC: 4.82 MIL/uL (ref 4.40–5.90)
RDW: 13.7 % (ref 11.5–14.5)
WBC: 4.5 10*3/uL (ref 3.8–10.6)

## 2017-04-06 LAB — HEPARIN LEVEL (UNFRACTIONATED): Heparin Unfractionated: 1.32 IU/mL — ABNORMAL HIGH (ref 0.30–0.70)

## 2017-04-06 LAB — APTT: aPTT: 30 seconds (ref 24–36)

## 2017-04-06 LAB — GLUCOSE, CAPILLARY
GLUCOSE-CAPILLARY: 76 mg/dL (ref 65–99)
Glucose-Capillary: 125 mg/dL — ABNORMAL HIGH (ref 65–99)
Glucose-Capillary: 265 mg/dL — ABNORMAL HIGH (ref 65–99)
Glucose-Capillary: 229 mg/dL — ABNORMAL HIGH (ref 65–99)

## 2017-04-06 LAB — PROTIME-INR
INR: 1.03
Prothrombin Time: 13.4 seconds (ref 11.4–15.2)

## 2017-04-06 LAB — HIV ANTIBODY (ROUTINE TESTING W REFLEX): HIV SCREEN 4TH GENERATION: NONREACTIVE

## 2017-04-06 MED ORDER — HEPARIN (PORCINE) IN NACL 100-0.45 UNIT/ML-% IJ SOLN
1100.0000 [IU]/h | INTRAMUSCULAR | Status: DC
  Administered 2017-04-06: 1100 [IU]/h via INTRAVENOUS
  Filled 2017-04-06: qty 250

## 2017-04-06 MED ORDER — HEPARIN BOLUS VIA INFUSION
4000.0000 [IU] | Freq: Once | INTRAVENOUS | Status: AC
  Administered 2017-04-06: 4000 [IU] via INTRAVENOUS
  Filled 2017-04-06: qty 4000

## 2017-04-06 MED ORDER — NITROGLYCERIN 0.4 MG SL SUBL: 0.4000 mg | SUBLINGUAL_TABLET | SUBLINGUAL | Status: DC | PRN

## 2017-04-06 NOTE — Progress Notes (Signed)
Patient off unit for cardiac testing. Will resume care upon return. Shawn Hood Shawn Hood Shawn Hood  

## 2017-04-06 NOTE — Progress Notes (Signed)
Progress Note  Patient Name: Shawn Hood Date of Encounter: 04/06/2017  Primary Cardiologist: Hochrein  Subjective   No chest pain at rest. No dypsnea. No complaints at present. Notes that when he exerts he typically has chest discomfort.  Inpatient Medications    Scheduled Meds: . aspirin EC  81 mg Oral Daily  . atorvastatin  80 mg Oral QHS  . clopidogrel  75 mg Oral Daily  . enoxaparin (LOVENOX) injection  40 mg Subcutaneous Q24H  . insulin aspart  0-5 Units Subcutaneous QHS  . insulin aspart  0-9 Units Subcutaneous TID WC  . insulin aspart  10 Units Subcutaneous TID AC  . insulin glargine  60 Units Subcutaneous BH-[id number]  . isosorbide mononitrate  30 mg Oral Daily  . pantoprazole  40 mg Oral Daily   Continuous Infusions:  PRN Meds: acetaminophen, cyclobenzaprine, diphenhydrAMINE, fentaNYL (SUBLIMAZE) injection, morphine injection, nitroGLYCERIN, nitroGLYCERIN, ondansetron (ZOFRAN) IV   Vital Signs    Vitals:   04/05/17 1630 04/05/17 2029 04/06/17 0435 04/06/17 0938  BP: (!) 152/77 (!) 144/60 136/80 131/70  Pulse: 61 64 63 (!) 57  Resp: 20   19  Temp: 98.1 F (36.7 C) 98.1 F (36.7 C) 97.9 F (36.6 C)   TempSrc: Oral Oral Oral   SpO2: 100% 100% 98% 100%  Weight:      Height:        Intake/Output Summary (Last 24 hours) at 04/06/17 1228 Last data filed at 04/06/17 0435  Gross per 24 hour  Intake                0 ml  Output                0 ml  Net                0 ml   Filed Weights   04/05/17 0132 04/05/17 0535  Weight: 213 lb (96.6 kg) 210 lb 8 oz (95.5 kg)    Telemetry    Normal sinus rhythm - Personally Reviewed  ECG    NSR with ST-T changes consider lateral ischemia - Personally Reviewed  Physical Exam  Alert, oriented, in NAD GEN: No acute distress.   Neck: No JVD Cardiac: RRR, no murmurs, rubs, or gallops.  Respiratory: Clear to auscultation bilaterally. GI: Soft, nontender, non-distended  MS: No edema; No deformity. Neuro:   Nonfocal  Psych: Normal affect   Labs    Chemistry Recent Labs Lab 04/05/17 0203 04/05/17 0751  NA 137 137  K 3.5 3.7  CL 103 104  CO2 25 27  GLUCOSE 241* 200*  BUN 17 17  CREATININE 1.06 1.07  CALCIUM 9.2 9.1  PROT 8.0  --   ALBUMIN 3.8  --   AST 17  --   ALT 10*  --   ALKPHOS 72  --   BILITOT 0.7  --   GFRNONAA >60 >60  GFRAA >60 >60  ANIONGAP 9 6     Hematology Recent Labs Lab 04/05/17 0203 04/05/17 0751  WBC 5.9 5.6  RBC 4.72 4.69  HGB 13.3 13.1  HCT 39.0* 39.1*  MCV 82.6 83.5  MCH 28.3 28.0  MCHC 34.2 33.6  RDW 13.8 13.8  PLT 227 216    Cardiac Enzymes Recent Labs Lab 04/05/17 0203 04/05/17 0751 04/05/17 1257  TROPONINI 0.04* 0.06* 0.04*   No results for input(s): TROPIPOC in the last 168 hours.   BNPNo results for input(s): BNP, PROBNP in the last 168 hours.  DDimer No results for input(s): DDIMER in the last 168 hours.   Radiology    Dg Chest 2 View  Result Date: 04/05/2017 CLINICAL DATA:  Chest pain radiating into the left arm, onset tonight. EXAM: CHEST  2 VIEW COMPARISON:  03/26/2013 FINDINGS: The lungs are clear. Pulmonary vasculature is normal. Prior sternotomy and CABG. Hilar, mediastinal and cardiac contours are unchanged. No pleural effusions. IMPRESSION: No active cardiopulmonary disease. Electronically Signed   By: Ellery Plunk M.D.   On: 04/05/2017 01:50   Nm Myocar Multi W/spect W/wall Motion / Ef  Result Date: 04/05/2017 Pharmacological myocardial perfusion imaging study with ischemia in the lateral wall Unable to exclude mild ischemia in the mid to apical inferior and inferolateral wall Septal wall motion/hypokinesis, EF estimated at 44% No EKG changes concerning for ischemia at peak stress or in recovery. High risk scan Signed, Dossie Arbour, MD, Ph.D [facility]    Cardiac Studies   Myoview stress scan 04/05/2017: Study Result   Pharmacological myocardial perfusion imaging study with ischemia in the lateral  wall Unable to exclude mild ischemia in the mid to apical inferior and inferolateral wall Septal wall motion/hypokinesis, EF estimated at 44% No EKG changes concerning for ischemia at peak stress or in recovery. High risk scan     Patient Profile     61 y.o. male with known CAD status post CABG and history of saphenous vein graft PCI in 2013 presenting with symptoms of unstable angina pectoris, borderline troponin  Assessment & Plan    1. Unstable angina pectoris: The patient is now chest pain-free. With his borderline troponin, known CAD with previous CABG and PCI, I think it is best to start him on IV heparin over the weekend as we await cardiac catheterization Monday morning. He is advised to let us know if any chest pain at rest occurs. His medical program is reviewed and includes dual antiplatelet therapy with aspirin and clopidogrel, high intensity statin drug,and isosorbide.  2. Hypertension:medications reviewed. Blood pressures are reviewed and controlled.  3. Hyperlipidemia, uncontrolled. LDL cholesterol is 162 mg/dL.i'm not sure that he was taking a statin drug as an outpatient. Atorvastatin 80 mg has been started.  4. Type 2 diabetes:currently covered with Lantus insulin and sliding scale.  For questions or updates, please contact [facility] Please consult [email or web address] for contact info under Cardiology/STEMI.      Enzo Bi, MD  04/06/2017, 12:28 PM

## 2017-04-06 NOTE — Progress Notes (Signed)
ANTICOAGULATION CONSULT NOTE - Initial Consult  Pharmacy Consult for Heparin Drip Indication: chest pain/ACS  Allergies  Allergen Reactions  . Accupril [Quinapril Hcl]     Skin rash,  Lip swelling     Patient Measurements: Height:  (180.3 cm) Weight: 210 lb 8 oz (95.5 kg) IBW/kg (Calculated) : 75.3 Heparin Dosing Weight: 94.5  Vital Signs: Temp: 97.9 F (36.6 C) (10/13 0435) Temp Source: Oral (10/13 0435) BP: 131/70 (10/13 0938) Pulse Rate: 57 (10/13 0938)  Labs:  Recent Labs  04/05/17 0203 04/05/17 0751 04/05/17 1257  HGB 13.3 13.1  --   HCT 39.0* 39.1*  --   PLT 227 216  --   CREATININE 1.06 1.07  --   TROPONINI 0.04* 0.06* 0.04*    Estimated Creatinine Clearance: 87.7 mL/min (by C-G formula based on SCr of 1.07 mg/dL).   Medical History: Past Medical History:  Diagnosis Date  . Carotid arterial disease (HCC)    a. 03/2013 s/p L CEA;  b. 06/2015 Carotid U/S: RICA 1-39%, LICA patent.  . Coronary artery disease    a. 2002 s/p CABG x 3 (LIMA->LAD, VG->OM3, VG->AM;  b. 09/2010 NSTEMI/Cath: no culprits->Med Rx.  c. 02/2012 Cath/PCI: LM min irregs, LAD 156m, LCX 100, RCA 40p, 5m, AM 99, LIMA->LAD ok w/ sev diff apical LAD dzs (stable), VG->OM3 99 (3.0x12 Promus DES), VG->AM 25p, 60p.  . CVA (cerebral infarction)    a. on MRI 09/2010 - chronic infarct in the pons.  . Diabetes mellitus type 2, uncontrolled, with complications (HCC) DX: Age 34  . Hyperlipidemia   . Hypertension   . Myocardial infarction (HCC) 2002  . Sinus bradycardia   . Stroke [facility])    a. ? 2006;  b. on MRI 09/2010 - chronic infarct in the pons.    Assessment: Patient received enoxaparin  at 6:00 this AM New consult for heparin for chest pain/ACS Baseline labs ordered, patient not on anticoagulation prior to admission.   Goal of Therapy:  Heparin level 0.3-0.7 units/ml Monitor platelets by anticoagulation protocol: Yes   Plan:  Give 4000 units bolus x 1 Start heparin infusion at  1100 units/hr Check anti-Xa level in 6 hours and daily while on heparin Continue to monitor H&H and platelets  Novie Maggio C 04/06/2017,1:00 PM

## 2017-04-06 NOTE — Plan of Care (Signed)
Problem: Physical Regulation: Goal: Ability to maintain clinical measurements within normal limits will improve Outcome: Not Progressing Hemoglobin A1C this admission elevated at 8.7. Jari Favre [facility]

## 2017-04-06 NOTE — Progress Notes (Signed)
[facility] - [place] at [facility]   PATIENT NAME: Shawn Hood    MR#:  [id number]  DATE OF BIRTH:  [birth year]  SUBJECTIVE:   Patient without chest pain or shortness of breath this morning.  REVIEW OF SYSTEMS:    Review of Systems  Constitutional: Negative for fever, chills weight loss HENT: Negative for ear pain, nosebleeds, congestion, facial swelling, rhinorrhea, neck pain, neck stiffness and ear discharge.   Respiratory: Negative for cough, shortness of breath, wheezing  Cardiovascular: Negative for chest pain, palpitations and leg swelling.  Gastrointestinal: Negative for heartburn, abdominal pain, vomiting, diarrhea or consitpation Genitourinary: Negative for dysuria, urgency, frequency, hematuria Musculoskeletal: Negative for back pain or joint pain Neurological: Negative for dizziness, seizures, syncope, focal weakness,  numbness and headaches.  Hematological: Does not bruise/bleed easily.  Psychiatric/Behavioral: Negative for hallucinations, confusion, dysphoric mood    Tolerating Diet: yes      DRUG ALLERGIES:   Allergies  Allergen Reactions  . Accupril [Quinapril Hcl]     Skin rash,  Lip swelling     VITALS:  Blood pressure 131/70, pulse (!) 57, temperature 97.9 F (36.6 C), temperature source Oral, resp. rate 19, height  (1.803 m), weight 95.5 kg (210 lb 8 oz), SpO2 100 %.  PHYSICAL EXAMINATION:  Constitutional: Appears well-developed and well-nourished. No distress. HENT: Normocephalic. Marland Kitchen Oropharynx is clear and moist.  Eyes: Conjunctivae and EOM are normal. PERRLA, no scleral icterus.  Neck: Normal ROM. Neck supple. No JVD. No tracheal deviation. CVS: RRR, S1/S2 +, no murmurs, no gallops, no carotid bruit.  Pulmonary: Effort and breath sounds normal, no stridor, rhonchi, wheezes, rales.  Abdominal: Soft. BS +,  no distension, tenderness, rebound or guarding.  Musculoskeletal: Normal range of motion. No edema and no tenderness.   Neuro: Alert. CN 2-12 grossly intact. No focal deficits. Skin: Skin is warm and dry. No rash noted. Psychiatric: Normal mood and affect.      LABORATORY PANEL:   CBC  Recent Labs Lab 04/05/17 0751  WBC 5.6  HGB 13.1  HCT 39.1*  PLT 216   ------------------------------------------------------------------------------------------------------------------  Chemistries   Recent Labs Lab 04/05/17 0203 04/05/17 0751  NA 137 137  K 3.5 3.7  CL 103 104  CO2 25 27  GLUCOSE 241* 200*  BUN 17 17  CREATININE 1.06 1.07  CALCIUM 9.2 9.1  AST 17  --   ALT 10*  --   ALKPHOS 72  --   BILITOT 0.7  --    ------------------------------------------------------------------------------------------------------------------  Cardiac Enzymes  Recent Labs Lab 04/05/17 0203 04/05/17 0751 04/05/17 1257  TROPONINI 0.04* 0.06* 0.04*   ------------------------------------------------------------------------------------------------------------------  RADIOLOGY:  Dg Chest 2 View  Result Date: 04/05/2017 CLINICAL DATA:  Chest pain radiating into the left arm, onset tonight. EXAM: CHEST  2 VIEW COMPARISON:  03/26/2013 FINDINGS: The lungs are clear. Pulmonary vasculature is normal. Prior sternotomy and CABG. Hilar, mediastinal and cardiac contours are unchanged. No pleural effusions. IMPRESSION: No active cardiopulmonary disease. Electronically Signed   By: Ellery Plunk M.D.   On: 04/05/2017 01:50   Nm Myocar Multi W/spect W/wall Motion / Ef  Result Date: 04/05/2017 Pharmacological myocardial perfusion imaging study with ischemia in the lateral wall Unable to exclude mild ischemia in the mid to apical inferior and inferolateral wall Septal wall motion/hypokinesis, EF estimated at 44% No EKG changes concerning for ischemia at peak stress or in recovery. High risk scan Signed, Dossie Arbour, MD, Ph.D [facility]     ASSESSMENT AND PLAN:  61 year old male with history of CAD/CABG  and diabetes who presented with chest pain.   1. Unstable angina: Stress test shows lateral wall ischemia and EF of 44%. Given anginal symptoms and ischemia stress test cardiology is recommending cardiac catheterization for Monday. Start heparin gtt   2. CAD: Continue aspirin, atorvastatin, Plavix Consider adding metoprolol if heart rate is acceptable.  3. Essential hypertension: Continue isosorbide  4. Diabetes: Continue Lantus, ADA diet and sliding scale   Management plans discussed with the patient and he is in agreement.  CODE STATUS: full  TOTAL TIME TAKING CARE OF THIS PATIENT: 27 minutes.     POSSIBLE D/C Monday/tuesday, DEPENDING ON CLINICAL CONDITION.   Nicoya Friel M.D on 04/06/2017 at 12:30 PM  Between 7am to 6pm - Pager - [phone] After 6pm go to [email or web address] - password [email or web address]  [facility] [place] [facility]  Office  [phone]  CC: Primary care physician; Hyman Hopes, MD  Note: This dictation was prepared with Dragon dictation along with smaller phrase technology. Any transcriptional errors that result from this process are unintentional.

## 2017-04-06 NOTE — Progress Notes (Signed)
[facility] - [place] at [facility]   PATIENT NAME: Shawn Hood    MR#:  [id number]  DATE OF BIRTH:  1956/02/16  SUBJECTIVE:  CHIEF COMPLAINT:  Pt denies chest pain during my exam, had stress test   REVIEW OF SYSTEMS:  CONSTITUTIONAL: No fever, fatigue or weakness.  EYES: No blurred or double vision.  EARS, NOSE, AND THROAT: No tinnitus or ear pain.  RESPIRATORY: No cough, shortness of breath, wheezing or hemoptysis.  CARDIOVASCULAR: No chest pain, orthopnea, edema.  GASTROINTESTINAL: No nausea, vomiting, diarrhea or abdominal pain.  GENITOURINARY: No dysuria, hematuria.  ENDOCRINE: No polyuria, nocturia,  HEMATOLOGY: No anemia, easy bruising or bleeding SKIN: No rash or lesion. MUSCULOSKELETAL: No joint pain or arthritis.   NEUROLOGIC: No tingling, numbness, weakness.  PSYCHIATRY: No anxiety or depression.   DRUG ALLERGIES:   Allergies  Allergen Reactions  . Accupril [Quinapril Hcl]     Skin rash,  Lip swelling     VITALS:  Blood pressure (!) 144/60, pulse 64, temperature 98.1 F (36.7 C), temperature source Oral, resp. rate 20, height  (1.803 m), weight 95.5 kg (210 lb 8 oz), SpO2 100 %.  PHYSICAL EXAMINATION:  GENERAL:  61 y.o.-year-old patient lying in the bed with no acute distress.  EYES: Pupils equal, round, reactive to light and accommodation. No scleral icterus. Extraocular muscles intact.  HEENT: Head atraumatic, normocephalic. Oropharynx and nasopharynx clear.  NECK:  Supple, no jugular venous distention. No thyroid enlargement, no tenderness.  LUNGS: Normal breath sounds bilaterally, no wheezing, rales,rhonchi or crepitation. No use of accessory muscles of respiration.  CARDIOVASCULAR: S1, S2 normal. No murmurs, rubs, or gallops.  ABDOMEN: Soft, nontender, nondistended. Bowel sounds present. No organomegaly or mass.  EXTREMITIES: No pedal edema, cyanosis, or clubbing.  NEUROLOGIC: Cranial nerves II through XII are intact.  Muscle strength 5/5 in all extremities. Sensation intact. Gait not checked.  PSYCHIATRIC: The patient is alert and oriented x 3.  SKIN: No obvious rash, lesion, or ulcer.    LABORATORY PANEL:   CBC  Recent Labs Lab 04/05/17 0751  WBC 5.6  HGB 13.1  HCT 39.1*  PLT 216   ------------------------------------------------------------------------------------------------------------------  Chemistries   Recent Labs Lab 04/05/17 0203 04/05/17 0751  NA 137 137  K 3.5 3.7  CL 103 104  CO2 25 27  GLUCOSE 241* 200*  BUN 17 17  CREATININE 1.06 1.07  CALCIUM 9.2 9.1  AST 17  --   ALT 10*  --   ALKPHOS 72  --   BILITOT 0.7  --    ------------------------------------------------------------------------------------------------------------------  Cardiac Enzymes  Recent Labs Lab 04/05/17 1257  TROPONINI 0.04*   ------------------------------------------------------------------------------------------------------------------  RADIOLOGY:  Dg Chest 2 View  Result Date: 04/05/2017 CLINICAL DATA:  Chest pain radiating into the left arm, onset tonight. EXAM: CHEST  2 VIEW COMPARISON:  03/26/2013 FINDINGS: The lungs are clear. Pulmonary vasculature is normal. Prior sternotomy and CABG. Hilar, mediastinal and cardiac contours are unchanged. No pleural effusions. IMPRESSION: No active cardiopulmonary disease. Electronically Signed   By: Ellery Plunk M.D.   On: 04/05/2017 01:50   Nm Myocar Multi W/spect W/wall Motion / Ef  Result Date: 04/05/2017 Pharmacological myocardial perfusion imaging study with ischemia in the lateral wall Unable to exclude mild ischemia in the mid to apical inferior and inferolateral wall Septal wall motion/hypokinesis, EF estimated at 44% No EKG changes concerning for ischemia at peak stress or in recovery. High risk scan Signed, Dossie Arbour, MD, Ph.D [facility]  EKG:   Orders placed or performed during the hospital encounter of 04/05/17  . ED  EKG  . ED EKG    ASSESSMENT AND PLAN:    61 year old male patient with history of coronary artery disease, CABG, hypertension, hyperlipidemia, type 2 diabetes mellitus presented to the emergency room with chest pain.   1. Unstable angina patient on aspirin and Plavix Stress test is abnml, scheduled for cardiac cath on Monday F/u with cardio - [facility]  2. Coronary artery disease Asa, plavix, statin, imdur  3. Hypertension  4. Hyperlipidemia statin   5. Type 2 diabetes mellitus lantus , iss     All the records are reviewed and case discussed with Care Management/Social Workerr. Management plans discussed with the patient, family and they are in agreement.  CODE STATUS:fc  TOTAL TIME TAKING CARE OF THIS PATIENT: 35 minutes.   POSSIBLE D/C IN 2 DAYS, DEPENDING ON CLINICAL CONDITION.  Note: This dictation was prepared with Dragon dictation along with smaller phrase technology. Any transcriptional errors that result from this process are unintentional.   Ramonita Lab M.D on 04/05/17  Between 7am to 6pm - Pager - [phone] After 6pm go to [email or web address] - password [email or web address] [facility]  [place] [facility]  Office  [phone]  CC: Primary care physician; Hyman Hopes, MD

## 2017-04-07 DIAGNOSIS — I200 Unstable angina: Secondary | ICD-10-CM

## 2017-04-07 LAB — HEPARIN LEVEL (UNFRACTIONATED)
HEPARIN UNFRACTIONATED: 0.96 [IU]/mL — AB (ref 0.30–0.70)
HEPARIN UNFRACTIONATED: 0.98 [IU]/mL — AB (ref 0.30–0.70)

## 2017-04-07 LAB — ECHOCARDIOGRAM COMPLETE
CHL CUP PV REG GRAD DIAS: 7 mmHg
CHL CUP REG VEL DIAS: 130 cm/s
E decel time: 229 msec
E/e' ratio: 10.78
FS: 27 % — AB (ref 28–44)
HEIGHTINCHES: 71 in
IVS/LV PW RATIO, ED: 0.8
LA ID, A-P, ES: 42 mm
LA diam end sys: 42 mm
LA diam index: 1.9 cm/m2
LA vol A4C: 40.5 ml
LA vol index: 21.2 mL/m2
LAVOL: 46.8 mL
LV E/e'average: 10.78
LV PW d: 12.5 mm — AB (ref 0.6–1.1)
LV TDI E'MEDIAL: 5.22
LV e' LATERAL: 7.94 cm/s
LVEEMED: 10.78
Lateral S' vel: 5.02 cm/s
MV Dec: 229
MV Peak grad: 3 mmHg
MVAP: 3.28 cm2
MVPKAVEL: 85.6 m/s
MVPKEVEL: 85.6 m/s
P 1/2 time: 67 ms
RV TAPSE: 15.4 mm
TDI e' lateral: 7.94
WEIGHTICAEL: 3368 [oz_av]

## 2017-04-07 LAB — CBC
HEMATOCRIT: 39.5 % — AB (ref 40.0–52.0)
Hemoglobin: 13.3 g/dL (ref 13.0–18.0)
MCH: 28.2 pg (ref 26.0–34.0)
MCHC: 33.7 g/dL (ref 32.0–36.0)
MCV: 83.6 fL (ref 80.0–100.0)
PLATELETS: 231 10*3/uL (ref 150–440)
RBC: 4.73 MIL/uL (ref 4.40–5.90)
RDW: 13.7 % (ref 11.5–14.5)
WBC: 6.5 10*3/uL (ref 3.8–10.6)

## 2017-04-07 LAB — BASIC METABOLIC PANEL
Anion gap: 6 (ref 5–15)
BUN: 15 mg/dL (ref 6–20)
CHLORIDE: 105 mmol/L (ref 101–111)
CO2: 27 mmol/L (ref 22–32)
CREATININE: 0.93 mg/dL (ref 0.61–1.24)
Calcium: 9 mg/dL (ref 8.9–10.3)
GFR calc Af Amer: >60 mL/min (ref >60)
GFR calc non Af Amer: >60 mL/min (ref >60)
Glucose, Bld: 84 mg/dL (ref 65–99)
POTASSIUM: 3.5 mmol/L (ref 3.5–5.1)
SODIUM: 138 mmol/L (ref 135–145)

## 2017-04-07 LAB — GLUCOSE, CAPILLARY
GLUCOSE-CAPILLARY: 69 mg/dL (ref 65–99)
GLUCOSE-CAPILLARY: 160 mg/dL — AB (ref 65–99)
Glucose-Capillary: 169 mg/dL — ABNORMAL HIGH (ref 65–99)
Glucose-Capillary: 111 mg/dL — ABNORMAL HIGH (ref 65–99)
Glucose-Capillary: 118 mg/dL — ABNORMAL HIGH (ref 65–99)

## 2017-04-07 MED ORDER — SODIUM CHLORIDE 0.9% FLUSH
3.0000 mL | Freq: Two times a day (BID) | INTRAVENOUS | Status: DC
  Administered 2017-04-07 – 2017-04-09 (×4): 3 mL via INTRAVENOUS

## 2017-04-07 MED ORDER — SODIUM CHLORIDE 0.9% FLUSH
3.0000 mL | Freq: Two times a day (BID) | INTRAVENOUS | Status: DC
  Administered 2017-04-07 – 2017-04-09 (×3): 3 mL via INTRAVENOUS

## 2017-04-07 MED ORDER — HEPARIN (PORCINE) IN NACL 100-0.45 UNIT/ML-% IJ SOLN
500.0000 [IU]/h | INTRAMUSCULAR | Status: DC
  Administered 2017-04-07 (×2): 900 [IU]/h via INTRAVENOUS
  Filled 2017-04-07: qty 250

## 2017-04-07 NOTE — Progress Notes (Signed)
[facility] - [place] at [facility]   PATIENT NAME: Shawn Hood    MR#:  [id number]  DATE OF BIRTH:  Apr 25, 1956  SUBJECTIVE:   No issues overnight  No CP or SOB  REVIEW OF SYSTEMS:    Review of Systems  Constitutional: Negative for fever, chills weight loss HENT: Negative for ear pain, nosebleeds, congestion, facial swelling, rhinorrhea, neck pain, neck stiffness and ear discharge.   Respiratory: Negative for cough, shortness of breath, wheezing  Cardiovascular: Negative for chest pain, palpitations and leg swelling.  Gastrointestinal: Negative for heartburn, abdominal pain, vomiting, diarrhea or consitpation Genitourinary: Negative for dysuria, urgency, frequency, hematuria Musculoskeletal: Negative for back pain or joint pain Neurological: Negative for dizziness, seizures, syncope, focal weakness,  numbness and headaches.  Hematological: Does not bruise/bleed easily.  Psychiatric/Behavioral: Negative for hallucinations, confusion, dysphoric mood    Tolerating Diet: yes      DRUG ALLERGIES:   Allergies  Allergen Reactions  . Accupril [Quinapril Hcl]     Skin rash,  Lip swelling     VITALS:  Blood pressure 138/68, pulse 61, temperature 98.3 F (36.8 C), temperature source Oral, resp. rate 17, height  (1.803 m), weight 95.5 kg (210 lb 8 oz), SpO2 97 %.  PHYSICAL EXAMINATION:  Constitutional: Appears well-developed and well-nourished. No distress. HENT: Normocephalic. Marland Kitchen Oropharynx is clear and moist.  Eyes: Conjunctivae and EOM are normal. PERRLA, no scleral icterus.  Neck: Normal ROM. Neck supple. No JVD. No tracheal deviation. CVS: RRR, S1/S2 +, no murmurs, no gallops, no carotid bruit.  Pulmonary: Effort and breath sounds normal, no stridor, rhonchi, wheezes, rales.  Abdominal: Soft. BS +,  no distension, tenderness, rebound or guarding.  Musculoskeletal: Normal range of motion. No edema and no tenderness.  Neuro: Alert. CN 2-12 grossly  intact. No focal deficits. Skin: Skin is warm and dry. No rash noted. Psychiatric: Normal mood and affect.      LABORATORY PANEL:   CBC  Recent Labs Lab 04/07/17 0525  WBC 6.5  HGB 13.3  HCT 39.5*  PLT 231   ------------------------------------------------------------------------------------------------------------------  Chemistries   Recent Labs Lab 04/05/17 0203  04/07/17 0525  NA 137  < > 138  K 3.5  < > 3.5  CL 103  < > 105  CO2 25  < > 27  GLUCOSE 241*  < > 84  BUN 17  < > 15  CREATININE 1.06  < > 0.93  CALCIUM 9.2  < > 9.0  AST 17  --   --   ALT 10*  --   --   ALKPHOS 72  --   --   BILITOT 0.7  --   --   < > = values in this interval not displayed. ------------------------------------------------------------------------------------------------------------------  Cardiac Enzymes  Recent Labs Lab 04/05/17 0203 04/05/17 0751 04/05/17 1257  TROPONINI 0.04* 0.06* 0.04*   ------------------------------------------------------------------------------------------------------------------  RADIOLOGY:  Nm Myocar Multi W/spect W/wall Motion / Ef  Result Date: 04/05/2017 Pharmacological myocardial perfusion imaging study with ischemia in the lateral wall Unable to exclude mild ischemia in the mid to apical inferior and inferolateral wall Septal wall motion/hypokinesis, EF estimated at 44% No EKG changes concerning for ischemia at peak stress or in recovery. High risk scan Signed, Dossie Arbour, MD, Ph.D [facility]     ASSESSMENT AND PLAN:    61 year old male with history of CAD/CABG and diabetes who presented with chest pain.   1. Unstable angina: Stress test shows lateral wall ischemia and EF of  44%. Given anginal symptoms and ischemia stress test cardiology is recommending cardiac catheterization for Monday. Continue heparin gtt NPO after MN   2. CAD: Continue aspirin, atorvastatin, Plavix Consider adding metoprolol if heart rate is  acceptable.  3. Essential hypertension: Continue isosorbide  4. Diabetes: Continue Lantus, ADA diet and sliding scale   Management plans discussed with the patient and he is in agreement.  CODE STATUS: full  TOTAL TIME TAKING CARE OF THIS PATIENT: 22 minutes.     POSSIBLE D/C Monday/tuesday, DEPENDING ON CATH   Anjel Pardo M.D on 04/07/2017 at 11:07 AM  Between 7am to 6pm - Pager - [phone] After 6pm go to [email or web address] - password [email or web address]  [facility] [place] [facility]  Office  [phone]  CC: Primary care physician; Hyman Hopes, MD  Note: This dictation was prepared with Dragon dictation along with smaller phrase technology. Any transcriptional errors that result from this process are unintentional.

## 2017-04-07 NOTE — Progress Notes (Signed)
ANTICOAGULATION CONSULT NOTE - Initial Consult  Pharmacy Consult for Heparin Drip Indication: chest pain/ACS  Allergies  Allergen Reactions  . Accupril [Quinapril Hcl]     Skin rash,  Lip swelling     Patient Measurements: Height:  (180.3 cm) Weight: 210 lb 8 oz (95.5 kg) IBW/kg (Calculated) : 75.3 Heparin Dosing Weight: 94.5  Vital Signs: Temp: 97.8 F (36.6 C) (10/14 0418) Temp Source: Oral (10/14 0418) BP: 129/67 (10/14 0418) Pulse Rate: 58 (10/14 0418)  Labs:  Recent Labs  04/05/17 0203 04/05/17 0751 04/05/17 1257 04/06/17 1233 04/06/17 1337 04/06/17 2059 04/07/17 0525  HGB 13.3 13.1  --  13.5  --   --  13.3  HCT 39.0* 39.1*  --  40.4  --   --  39.5*  PLT 227 216  --  240  --   --  231  APTT  --   --   --   --  30  --   --   LABPROT  --   --   --   --  13.4  --   --   INR  --   --   --   --  1.03  --   --   HEPARINUNFRC  --   --   --   --   --  1.32*  --   CREATININE 1.06 1.07  --   --   --   --  0.93  TROPONINI 0.04* 0.06* 0.04*  --   --   --   --     Estimated Creatinine Clearance: 100.9 mL/min (by C-G formula based on SCr of 0.93 mg/dL).   Medical History: Past Medical History:  Diagnosis Date  . Carotid arterial disease (HCC)    a. 03/2013 s/p L CEA;  b. 06/2015 Carotid U/S: RICA 1-39%, LICA patent.  . Coronary artery disease    a. 2002 s/p CABG x 3 (LIMA->LAD, VG->OM3, VG->AM;  b. 09/2010 NSTEMI/Cath: no culprits->Med Rx.  c. 02/2012 Cath/PCI: LM min irregs, LAD 111m, LCX 100, RCA 40p, 23m, AM 99, LIMA->LAD ok w/ sev diff apical LAD dzs (stable), VG->OM3 99 (3.0x12 Promus DES), VG->AM 25p, 60p.  . CVA (cerebral infarction)    a. on MRI 09/2010 - chronic infarct in the pons.  . Diabetes mellitus type 2, uncontrolled, with complications (HCC) DX: Age 34  . Hyperlipidemia   . Hypertension   . Myocardial infarction (HCC) 2002  . Sinus bradycardia   . Stroke [facility])    a. ? 2006;  b. on MRI 09/2010 - chronic infarct in the pons.     Assessment: Patient received enoxaparin  at 6:00 this AM New consult for heparin for chest pain/ACS Baseline labs ordered, patient not on anticoagulation prior to admission.   Goal of Therapy:  Heparin level 0.3-0.7 units/ml Monitor platelets by anticoagulation protocol: Yes   Plan:  Current orders for heparin 1100 units/hr. Heparin level supratherapeutic. Will hold heparin x 1 hour. Restart at 900 units/hr and recheck heparin level in 6 hours.  Donja Tipping C 04/07/2017,7:35 AM

## 2017-04-07 NOTE — Progress Notes (Signed)
Called by pharmacy and made aware heparin level was high, ordered for heparin drip to stopped will place orders to restart

## 2017-04-07 NOTE — Progress Notes (Signed)
Text paged dr mody to make aware sugar was 69 this am. Hold or give scheduled dose of novolog 10 units. md placed order to discontinue scheduled novolog will continue to monitor

## 2017-04-07 NOTE — Progress Notes (Signed)
Progress Note  Patient Name: Shawn Hood Date of Encounter: 04/07/2017  Primary Cardiologist: Hochrein  Subjective   Patient alone in room today. He denies chest pain or shortness of breath.  Inpatient Medications    Scheduled Meds: . aspirin EC  81 mg Oral Daily  . atorvastatin  80 mg Oral QHS  . clopidogrel  75 mg Oral Daily  . insulin aspart  0-5 Units Subcutaneous QHS  . insulin aspart  0-9 Units Subcutaneous TID WC  . insulin glargine  60 Units Subcutaneous BH-q7a  . isosorbide mononitrate  30 mg Oral Daily  . pantoprazole  40 mg Oral Daily   Continuous Infusions: . heparin 900 Units/hr (04/07/17 1000)   PRN Meds: acetaminophen, cyclobenzaprine, diphenhydrAMINE, fentaNYL (SUBLIMAZE) injection, morphine injection, nitroGLYCERIN, ondansetron (ZOFRAN) IV   Vital Signs    Vitals:   04/06/17 2137 04/07/17 0418 04/07/17 0743 04/07/17 1154  BP: 122/69 129/67 138/68 130/72  Pulse: 64 (!) 58 61 63  Resp: Temp: 98.3 F (36.8 C) 97.8 F (36.6 C) 98.3 F (36.8 C) 98 F (36.7 C)  TempSrc: Oral Oral Oral Oral  SpO2: 100% 95% 97% 96%  Weight:      Height:        Intake/Output Summary (Last 24 hours) at 04/07/17 1306 Last data filed at 04/07/17 1244  Gross per 24 hour  Intake           669.75 ml  Output              600 ml  Net            69.75 ml   Filed Weights   04/05/17 0132 04/05/17 0535  Weight: 213 lb (96.6 kg) 210 lb 8 oz (95.5 kg)    Telemetry    Normal sinus rhythm - Personally Reviewed   Physical Exam  Alert, oriented male in no distress GEN: No acute distress.   Neck: No JVD Cardiac: RRR, no murmurs, rubs, or gallops.  Respiratory: Clear to auscultation bilaterally. GI: Soft, nontender, non-distended  MS: No edema; No deformity. Neuro:  Nonfocal  Psych: Normal affect   Labs    Chemistry Recent Labs Lab 04/05/17 0203 04/05/17 0751 04/07/17 0525  NA 137 137 138  K 3.5 3.7 3.5  CL 103 104 105  CO2 GLUCOSE 241* 200* 84  BUN CREATININE 1.06 1.07 0.93  CALCIUM 9.2 9.1 9.0  PROT 8.0  --   --   ALBUMIN 3.8  --   --   AST 17  --   --   ALT 10*  --   --   ALKPHOS 72  --   --   BILITOT 0.7  --   --   GFRNONAA >60 >60 >60  GFRAA >60 >60 >60  ANIONGAP Hematology Recent Labs Lab 04/05/17 0751 04/06/17 1233 04/07/17 0525  WBC 5.6 4.5 6.5  RBC 4.69 4.82 4.73  HGB 13.1 13.5 13.3  HCT 39.1* 40.4 39.5*  MCV 83.5 83.7 83.6  MCH 28.0 28.0 28.2  MCHC 33.6 33.4 33.7  RDW 13.8 13.7 13.7  PLT 216 240 231    Cardiac Enzymes Recent Labs Lab 04/05/17 0203 04/05/17 0751 04/05/17 1257  TROPONINI 0.04* 0.06* 0.04*   No results for input(s): TROPIPOC in the last 168 hours.   BNPNo results for input(s): BNP, PROBNP in the last 168 hours.  DDimer No results for input(s): DDIMER in the last 168 hours.   Radiology    Nm Myocar Multi W/spect W/wall Motion / Ef  Result Date: 04/05/2017 Pharmacological myocardial perfusion imaging study with ischemia in the lateral wall Unable to exclude mild ischemia in the mid to apical inferior and inferolateral wall Septal wall motion/hypokinesis, EF estimated at 44% No EKG changes concerning for ischemia at peak stress or in recovery. High risk scan Signed, Dossie Arbour, MD, Ph.D [facility]    Cardiac Studies   Myoview stress scan 04/05/2017: Study Result   Pharmacological myocardial perfusion imaging study with ischemia in the lateral wall Unable to exclude mild ischemia in the mid to apical inferior and inferolateral wall Septal wall motion/hypokinesis, EF estimated at 44% No EKG changes concerning for ischemia at peak stress or in recovery. High risk scan     Patient Profile     61 y.o. male with known CAD status post CABG and history of saphenous vein graft PCI in 2013 presenting with symptoms of unstable angina pectoris, borderline troponin  Assessment & Plan    Unstable angina: No recurrence of chest  pain. Continue IV heparin, aspirin, and clopidogrel. The patient is tolerating a high intensity statin drug. Plans for cardiac catheterization/possible PCI tomorrow. I have reviewed the risks, indications, and alternatives to cardiac catheterization, possible angioplasty, and stenting with the patient. Risks include but are not limited to bleeding, infection, vascular injury, stroke, myocardial infection, arrhythmia, kidney injury, radiation-related injury in the case of prolonged fluoroscopy use, emergency cardiac surgery, and death. The patient understands the risks of serious complication is 1-2 in 1000 with diagnostic cardiac cath and 1-2% or less with angioplasty/stenting.  Orders placed. Will add on to Schedule.  Other problems stable, managed per hospitalist team.  For questions or updates, please contact [facility] Please consult [email or web address] for contact info under Cardiology/STEMI.      Enzo Bi, MD  04/07/2017, 1:06 PM

## 2017-04-07 NOTE — Progress Notes (Signed)
ANTICOAGULATION CONSULT NOTE - Initial Consult  Pharmacy Consult for Heparin Drip Indication: chest pain/ACS  Allergies  Allergen Reactions  . Accupril [Quinapril Hcl]     Skin rash,  Lip swelling     Patient Measurements: Height:  (180.3 cm) Weight: 210 lb 8 oz (95.5 kg) IBW/kg (Calculated) : 75.3 Heparin Dosing Weight: 94.5  Vital Signs: Temp: 98 F (36.7 C) (10/14 1154) Temp Source: Oral (10/14 1154) BP: 130/72 (10/14 1154) Pulse Rate: 63 (10/14 1154)  Labs:  Recent Labs  04/05/17 0203 04/05/17 0751 04/05/17 1257 04/06/17 1233 04/06/17 1337 04/06/17 2059 04/07/17 0525 04/07/17 1414  HGB 13.3 13.1  --  13.5  --   --  13.3  --   HCT 39.0* 39.1*  --  40.4  --   --  39.5*  --   PLT 227 216  --  240  --   --  231  --   APTT  --   --   --   --  30  --   --   --   LABPROT  --   --   --   --  13.4  --   --   --   INR  --   --   --   --  1.03  --   --   --   HEPARINUNFRC  --   --   --   --   --  1.32*  --  0.96*  CREATININE 1.06 1.07  --   --   --   --  0.93  --   TROPONINI 0.04* 0.06* 0.04*  --   --   --   --   --     Estimated Creatinine Clearance: 100.9 mL/min (by C-G formula based on SCr of 0.93 mg/dL).   Medical History: Past Medical History:  Diagnosis Date  . Carotid arterial disease (HCC)    a. 03/2013 s/p L CEA;  b. 06/2015 Carotid U/S: RICA 1-39%, LICA patent.  . Coronary artery disease    a. 2002 s/p CABG x 3 (LIMA->LAD, VG->OM3, VG->AM;  b. 09/2010 NSTEMI/Cath: no culprits->Med Rx.  c. 02/2012 Cath/PCI: LM min irregs, LAD 19m, LCX 100, RCA 40p, 36m, AM 99, LIMA->LAD ok w/ sev diff apical LAD dzs (stable), VG->OM3 99 (3.0x12 Promus DES), VG->AM 25p, 60p.  . CVA (cerebral infarction)    a. on MRI 09/2010 - chronic infarct in the pons.  . Diabetes mellitus type 2, uncontrolled, with complications (HCC) DX: Age 61  . Hyperlipidemia   . Hypertension   . Myocardial infarction (HCC) 2002  . Sinus bradycardia   . Stroke [facility])    a. ? 2006;  b. on MRI  09/2010 - chronic infarct in the pons.    Assessment: Patient received enoxaparin  at 6:00 this AM New consult for heparin for chest pain/ACS Baseline labs ordered, patient not on anticoagulation prior to admission.   Goal of Therapy:  Heparin level 0.3-0.7 units/ml Monitor platelets by anticoagulation protocol: Yes   Plan:  Current orders for heparin 900 units/hr. Heparin level supratherapeutic. Will reduce to 700 units/hr and recheck heparin level in 6 hours.  Joshva Labreck C 04/07/2017,3:01 PM

## 2017-04-08 ENCOUNTER — Encounter
Admission: EM | Disposition: A | Discharge status: Home / Self Care | Payer: Self-pay | Source: Home / Self Care | Attending: Internal Medicine

## 2017-04-08 ENCOUNTER — Other Ambulatory Visit: Payer: Self-pay

## 2017-04-08 HISTORY — PX: INTRAVASCULAR PRESSURE WIRE/FFR STUDY: CATH118243

## 2017-04-08 HISTORY — PX: CORONARY STENT INTERVENTION: CATH118234

## 2017-04-08 HISTORY — PX: LEFT HEART CATH AND CORONARY ANGIOGRAPHY: CATH118249

## 2017-04-08 LAB — GLUCOSE, CAPILLARY
GLUCOSE-CAPILLARY: 80 mg/dL (ref 65–99)
GLUCOSE-CAPILLARY: 79 mg/dL (ref 65–99)
GLUCOSE-CAPILLARY: 66 mg/dL (ref 65–99)
Glucose-Capillary: 102 mg/dL — ABNORMAL HIGH (ref 65–99)
Glucose-Capillary: 208 mg/dL — ABNORMAL HIGH (ref 65–99)
Glucose-Capillary: 171 mg/dL — ABNORMAL HIGH (ref 65–99)

## 2017-04-08 LAB — CBC
HEMATOCRIT: 39.4 % — AB (ref 40.0–52.0)
Hemoglobin: 13.3 g/dL (ref 13.0–18.0)
MCH: 28.3 pg (ref 26.0–34.0)
MCHC: 33.9 g/dL (ref 32.0–36.0)
MCV: 83.6 fL (ref 80.0–100.0)
Platelets: 235 10*3/uL (ref 150–440)
RBC: 4.71 MIL/uL (ref 4.40–5.90)
RDW: 13.7 % (ref 11.5–14.5)
WBC: 6.4 10*3/uL (ref 3.8–10.6)

## 2017-04-08 LAB — BASIC METABOLIC PANEL
Anion gap: 6 (ref 5–15)
BUN: 17 mg/dL (ref 6–20)
CHLORIDE: 107 mmol/L (ref 101–111)
CO2: 27 mmol/L (ref 22–32)
Calcium: 9.1 mg/dL (ref 8.9–10.3)
Creatinine, Ser: 1.11 mg/dL (ref 0.61–1.24)
GFR calc non Af Amer: >60 mL/min (ref >60)
Glucose, Bld: 118 mg/dL — ABNORMAL HIGH (ref 65–99)
POTASSIUM: 3.7 mmol/L (ref 3.5–5.1)
SODIUM: 140 mmol/L (ref 135–145)

## 2017-04-08 LAB — HEPARIN LEVEL (UNFRACTIONATED): HEPARIN UNFRACTIONATED: 0.73 [IU]/mL — AB (ref 0.30–0.70)

## 2017-04-08 SURGERY — LEFT HEART CATH AND CORONARY ANGIOGRAPHY
Anesthesia: Moderate Sedation

## 2017-04-08 MED ORDER — CLOPIDOGREL BISULFATE 75 MG PO TABS
ORAL_TABLET | ORAL | Status: DC | PRN
  Administered 2017-04-08: 300 mg via ORAL

## 2017-04-08 MED ORDER — MIDAZOLAM HCL 2 MG/2ML IJ SOLN
INTRAMUSCULAR | Status: AC
Start: 2017-04-08 — End: ?
  Filled 2017-04-08: qty 2

## 2017-04-08 MED ORDER — ASPIRIN 81 MG PO CHEW
81.0000 mg | CHEWABLE_TABLET | ORAL | Status: AC
  Administered 2017-04-08: 81 mg via ORAL

## 2017-04-08 MED ORDER — SODIUM CHLORIDE 0.9% FLUSH: 3.0000 mL | INTRAVENOUS | Status: DC | PRN

## 2017-04-08 MED ORDER — NITROGLYCERIN 5 MG/ML IV SOLN
INTRAVENOUS | Status: AC
Start: 2017-04-08 — End: ?
  Filled 2017-04-08: qty 10

## 2017-04-08 MED ORDER — ADENOSINE (DIAGNOSTIC) 3 MG/ML IV SOLN
INTRAVENOUS | Status: AC
  Filled 2017-04-08: qty 30

## 2017-04-08 MED ORDER — SODIUM CHLORIDE 0.9 % WEIGHT BASED INFUSION
1.0000 mL/kg/h | INTRAVENOUS | Status: AC
  Administered 2017-04-08: 1 mL/kg/h via INTRAVENOUS

## 2017-04-08 MED ORDER — HEPARIN SODIUM (PORCINE) 1000 UNIT/ML IJ SOLN
INTRAMUSCULAR | Status: DC | PRN
  Administered 2017-04-08 (×2): 4000 [IU] via INTRAVENOUS

## 2017-04-08 MED ORDER — SODIUM CHLORIDE 0.9% FLUSH: 3.0000 mL | Freq: Two times a day (BID) | INTRAVENOUS | Status: DC

## 2017-04-08 MED ORDER — ENOXAPARIN SODIUM 40 MG/0.4ML ~~LOC~~ SOLN
40.0000 mg | SUBCUTANEOUS | Status: DC
  Administered 2017-04-09: 40 mg via SUBCUTANEOUS
  Filled 2017-04-08 (×2): qty 0.4

## 2017-04-08 MED ORDER — CLOPIDOGREL BISULFATE 300 MG PO TABS
ORAL_TABLET | ORAL | Status: AC
Start: 2017-04-08 — End: ?
  Filled 2017-04-08: qty 1

## 2017-04-08 MED ORDER — SODIUM CHLORIDE 0.9 % IV SOLN: 250.0000 mL | INTRAVENOUS | Status: DC | PRN

## 2017-04-08 MED ORDER — FENTANYL CITRATE (PF) 100 MCG/2ML IJ SOLN
INTRAMUSCULAR | Status: DC | PRN
  Administered 2017-04-08: 50 ug via INTRAVENOUS

## 2017-04-08 MED ORDER — SODIUM CHLORIDE 0.9 % IV SOLN
INTRAVENOUS | Status: AC | PRN
  Administered 2017-04-08: 250 mL via INTRAVENOUS

## 2017-04-08 MED ORDER — FENTANYL CITRATE (PF) 100 MCG/2ML IJ SOLN
INTRAMUSCULAR | Status: AC
  Filled 2017-04-08: qty 2

## 2017-04-08 MED ORDER — SODIUM CHLORIDE 0.9% FLUSH
3.0000 mL | Freq: Two times a day (BID) | INTRAVENOUS | Status: DC
  Administered 2017-04-09: 3 mL via INTRAVENOUS

## 2017-04-08 MED ORDER — SODIUM CHLORIDE 0.9 % WEIGHT BASED INFUSION: 3.0000 mL/kg/h | INTRAVENOUS | Status: DC

## 2017-04-08 MED ORDER — ASPIRIN 81 MG PO CHEW
CHEWABLE_TABLET | ORAL | Status: AC
Start: 2017-04-08 — End: 2017-04-08
  Filled 2017-04-08: qty 1

## 2017-04-08 MED ORDER — HEPARIN (PORCINE) IN NACL 2-0.9 UNIT/ML-% IJ SOLN
INTRAMUSCULAR | Status: AC
  Filled 2017-04-08: qty 500

## 2017-04-08 MED ORDER — ASPIRIN 81 MG PO CHEW: 81.0000 mg | CHEWABLE_TABLET | ORAL | Status: DC

## 2017-04-08 MED ORDER — NITROGLYCERIN 1 MG/10 ML FOR IR/CATH LAB
INTRA_ARTERIAL | Status: DC | PRN
  Administered 2017-04-08: 200 ug via INTRACORONARY

## 2017-04-08 MED ORDER — MIDAZOLAM HCL 2 MG/2ML IJ SOLN
INTRAMUSCULAR | Status: DC | PRN
  Administered 2017-04-08: 1 mg via INTRAVENOUS

## 2017-04-08 MED ORDER — HEPARIN SODIUM (PORCINE) 1000 UNIT/ML IJ SOLN
INTRAMUSCULAR | Status: AC
Start: 2017-04-08 — End: ?
  Filled 2017-04-08: qty 1

## 2017-04-08 MED ORDER — VERAPAMIL HCL 2.5 MG/ML IV SOLN
INTRAVENOUS | Status: DC | PRN
  Administered 2017-04-08: 2.5 mg via INTRA_ARTERIAL

## 2017-04-08 MED ORDER — IOPAMIDOL (ISOVUE-300) INJECTION 61%
INTRAVENOUS | Status: DC | PRN
  Administered 2017-04-08: 195 mL via INTRA_ARTERIAL

## 2017-04-08 MED ORDER — SODIUM CHLORIDE 0.9 % WEIGHT BASED INFUSION: 1.0000 mL/kg/h | INTRAVENOUS | Status: DC

## 2017-04-08 MED ORDER — VERAPAMIL HCL 2.5 MG/ML IV SOLN
INTRAVENOUS | Status: AC
Start: 2017-04-08 — End: ?
  Filled 2017-04-08: qty 2

## 2017-04-08 SURGICAL SUPPLY — 20 items
BALLN MINITREK RX 2.0X20 (BALLOONS) ×3
BALLN ~~LOC~~ TREK RX 2.5X15 (BALLOONS) ×3
BALLOON MINITREK RX 2.0X20 (BALLOONS) ×1 IMPLANT
BALLOON ~~LOC~~ TREK RX 2.5X15 (BALLOONS) ×1 IMPLANT
CATH 5F 110X4 TIG (CATHETERS) ×3 IMPLANT
CATH AMP RT 5F (CATHETERS) ×3 IMPLANT
CATH INFINITI 5FR AL1 (CATHETERS) ×3 IMPLANT
CATH INFINITI JR4 5F (CATHETERS) ×3 IMPLANT
CATH VISTA GUIDE 6FR JR4 (CATHETERS) ×3 IMPLANT
DEVICE INFLAT 30 PLUS (MISCELLANEOUS) ×3 IMPLANT
DEVICE RAD TR BAND REGULAR (VASCULAR PRODUCTS) ×3 IMPLANT
GLIDESHEATH SLEND A-KIT 6F 22G (SHEATH) ×3 IMPLANT
GLIDESHEATH SLEND SS 6F .021 (SHEATH) ×3 IMPLANT
KIT MANI 3VAL PERCEP (MISCELLANEOUS) ×3 IMPLANT
PACK CARDIAC CATH (CUSTOM PROCEDURE TRAY) ×3 IMPLANT
SHEATH PINNACLE 5F 10CM (SHEATH) IMPLANT
STENT SIERRA 2.25 X 23 MM (Permanent Stent) ×3 IMPLANT
WIRE PRESSURE VERRATA (WIRE) ×3 IMPLANT
WIRE ROSEN-J .035X260CM (WIRE) ×3 IMPLANT
WIRE RUNTHROUGH .014X180CM (WIRE) ×3 IMPLANT

## 2017-04-08 NOTE — Progress Notes (Signed)
Inpatient Diabetes Program Recommendations  AACE/ADA: New Consensus Statement on Inpatient Glycemic Control (2015)  Target Ranges:  Prepandial:   less than 140 mg/dL      Peak postprandial:   less than 180 mg/dL (1-2 hours)      Critically ill patients:  140 - 180 mg/dL   Results for Shawn Hood, Shawn Hood (MRN [id number]) as of 04/08/2017 10:29  Ref. Range 04/07/2017 08:12 04/07/2017 08:46 04/07/2017 11:51 04/07/2017 16:52 04/07/2017 22:01  Glucose-Capillary Latest Ref Range: 65 - 99 mg/dL 69 409 (H) 811 (H) 914 (H) 118 (H)   Results for Shawn Hood, Shawn Hood (MRN [id number]) as of 04/08/2017 10:29  Ref. Range 04/08/2017 07:35  Glucose-Capillary Latest Ref Range: 65 - 99 mg/dL 80    Home DM Meds: Humalog 75/25 Mix- 50 units BID       Metformin 1000 mg BID  Current Insulin Orders: Novolog Sensitive Correction Scale/ SSI (0-9 units) TID AC + HS      Lantus 60 units QAM            MD- Please consider slight reduction of Lantus to 55 units QAM      --Will follow patient during hospitalization--  Ambrose Finland RN, MSN, CDE Diabetes Coordinator Inpatient Glycemic Control Team Team Pager: [phone] (8a-5p)

## 2017-04-08 NOTE — Progress Notes (Signed)
Progress Note  Patient Name: Shawn Hood Date of Encounter: 04/08/2017  Primary Cardiologist: Hochrein  Subjective   No chest pain since he was hospitalized. He is scheduled for cardiac catheterization today.  Inpatient Medications    Scheduled Meds: . aspirin      . aspirin EC  81 mg Oral Daily  . atorvastatin  80 mg Oral QHS  . clopidogrel  75 mg Oral Daily  . insulin aspart  0-5 Units Subcutaneous QHS  . insulin aspart  0-9 Units Subcutaneous TID WC  . insulin glargine  60 Units Subcutaneous BH-q7a  . isosorbide mononitrate  30 mg Oral Daily  . pantoprazole  40 mg Oral Daily  . sodium chloride flush  3 mL Intravenous Q12H  . sodium chloride flush  3 mL Intravenous Q12H   Continuous Infusions: . heparin 500 Units/hr (04/08/17 0503)   PRN Meds: acetaminophen, cyclobenzaprine, diphenhydrAMINE, fentaNYL (SUBLIMAZE) injection, morphine injection, nitroGLYCERIN, ondansetron (ZOFRAN) IV   Vital Signs    Vitals:   04/07/17 2119 04/08/17 0204 04/08/17 0620 04/08/17 0745  BP: 140/75  (!) 144/82 (!) 133/55  Pulse: 70  62 61  Resp: Temp: 98.1 F (36.7 C)  97.9 F (36.6 C)   TempSrc: Oral  Oral   SpO2: 100%  95% 96%  Weight:  208 lb 3.2 oz (94.4 kg)    Height:        Intake/Output Summary (Last 24 hours) at 04/08/17 0915 Last data filed at 04/08/17 0149  Gross per 24 hour  Intake           782.22 ml  Output              800 ml  Net           -17.78 ml   Filed Weights   04/05/17 0132 04/05/17 0535 04/08/17 0204  Weight: 213 lb (96.6 kg) 210 lb 8 oz (95.5 kg) 208 lb 3.2 oz (94.4 kg)    Telemetry    Normal sinus rhythm - Personally Reviewed  ECG    Not done today - Personally Reviewed  Physical Exam  Alert and oriented 3. GEN: No acute distress.   Neck: No JVD Cardiac: RRR, no murmurs, rubs, or gallops.  Respiratory: Clear to auscultation bilaterally. GI: Soft, nontender, non-distended  MS: No edema; No deformity. Neuro:  Nonfocal    Psych: Normal affect   Labs    Chemistry Recent Labs Lab 04/05/17 0203 04/05/17 0751 04/07/17 0525 04/08/17 0250  NA 137 137 138 140  K 3.5 3.7 3.5 3.7  CL 103 104 105 107  CO2 GLUCOSE 241* 200* 84 118*  BUN CREATININE 1.06 1.07 0.93 1.11  CALCIUM 9.2 9.1 9.0 9.1  PROT 8.0  --   --   --   ALBUMIN 3.8  --   --   --   AST 17  --   --   --   ALT 10*  --   --   --   ALKPHOS 72  --   --   --   BILITOT 0.7  --   --   --   GFRNONAA >60 >60 >60 >60  GFRAA >60 >60 >60 >60  ANIONGAP Hematology Recent Labs Lab 04/06/17 1233 04/07/17 0525 04/08/17 0250  WBC 4.5 6.5 6.4  RBC 4.82 4.73 4.71  HGB 13.5 13.3 13.3  HCT 40.4 39.5* 39.4*  MCV 83.7 83.6 83.6  MCH 28.0 28.2 28.3  MCHC 33.4 33.7 33.9  RDW 13.7 13.7 13.7  PLT 240 231 235    Cardiac Enzymes Recent Labs Lab 04/05/17 0203 04/05/17 0751 04/05/17 1257  TROPONINI 0.04* 0.06* 0.04*   No results for input(s): TROPIPOC in the last 168 hours.   BNPNo results for input(s): BNP, PROBNP in the last 168 hours.   DDimer No results for input(s): DDIMER in the last 168 hours.   Radiology    No results found.  Cardiac Studies     Nm Myocar Multi W/spect W/wall Motion / Ef  Result Date: 04/05/2017 Pharmacological myocardial perfusion imaging study with ischemia in the lateral wall Unable to exclude mild ischemia in the mid to apical inferior and inferolateral wall Septal wall motion/hypokinesis, EF estimated at 44% No EKG changes concerning for ischemia at peak stress or in recovery. High risk scan Signed, Dossie Arbour, MD, Ph.D [facility]   Patient Profile     61 y.o. male with known CAD status post CABG and history of saphenous vein graft PCI in 2013 presenting with symptoms of unstable angina pectoris, borderline troponin   Assessment & Plan    1. Unstable angina: Nuclear stress test showed evidence of lateral wall ischemia. The patient is scheduled for left heart  catheterization and possible PCI today. I answered all his questions this morning. He is already on dual antiplatelet therapy.  2. Hyperlipidemia: Continue high dose atorvastatin.  For questions or updates, please contact [facility] Please consult [email or web address] for contact info under Cardiology/STEMI.      Signed, Lorine Bears, MD  04/08/2017, 9:15 AM

## 2017-04-08 NOTE — Care Management Important Message (Signed)
Important Message  Patient Details  Name: Shawn Hood MRN: [id number] Date of Birth: August 26, 1955   Medicare Important Message Given:  Yes    Gwenette Greet, RN 04/08/2017, 9:41 AM

## 2017-04-08 NOTE — Progress Notes (Signed)
[employer] [facility] [place] [facility]   PATIENT NAME: Shawn Hood    MR#:  [id number]  DATE OF BIRTH:  10/08/1955  SUBJECTIVE:   Patient is going for cardiac catheterization in the morning. No chest pain and shortness of breath.  REVIEW OF SYSTEMS:    Review of Systems  Constitutional: Negative for fever, chills weight loss HENT: Negative for ear pain, nosebleeds, congestion, facial swelling, rhinorrhea, neck pain, neck stiffness and ear discharge.   Respiratory: Negative for cough, shortness of breath, wheezing  Cardiovascular: Negative for chest pain, palpitations and leg swelling.  Gastrointestinal: Negative for heartburn, abdominal pain, vomiting, diarrhea or consitpation Genitourinary: Negative for dysuria, urgency, frequency, hematuria Musculoskeletal: Negative for back pain or joint pain Neurological: Negative for dizziness, seizures, syncope, focal weakness,  numbness and headaches.  Hematological: Does not bruise/bleed easily.  Psychiatric/Behavioral: Negative for hallucinations, confusion, dysphoric mood    Tolerating Diet:  npo     DRUG ALLERGIES:   Allergies  Allergen Reactions  . Accupril [Quinapril Hcl]     Skin rash,  Lip swelling     VITALS:  Blood pressure (!) 144/82, pulse 62, temperature 97.9 F (36.6 C), temperature source Oral, resp. rate 18, height  (1.803 m), weight 94.4 kg (208 lb 3.2 oz), SpO2 95 %.  PHYSICAL EXAMINATION:  Constitutional: Appears well-developed and well-nourished. No distress. HENT: Normocephalic. Marland Kitchen Oropharynx is clear and moist.  Eyes: Conjunctivae and EOM are normal. PERRLA, no scleral icterus.  Neck: Normal ROM. Neck supple. No JVD. No tracheal deviation. CVS: RRR, S1/S2 +, no murmurs, no gallops, no carotid bruit.  Pulmonary: Effort and breath sounds normal, no stridor, rhonchi, wheezes, rales.  Abdominal: Soft. BS +,  no distension, tenderness, rebound or guarding.  Musculoskeletal: Normal range of  motion. No edema and no tenderness.  Neuro: Alert. CN 2-12 grossly intact. No focal deficits. Skin: Skin is warm and dry. No rash noted. Psychiatric: Normal mood and affect.      LABORATORY PANEL:   CBC  Recent Labs Lab 04/08/17 0250  WBC 6.4  HGB 13.3  HCT 39.4*  PLT 235   ------------------------------------------------------------------------------------------------------------------  Chemistries   Recent Labs Lab 04/05/17 0203  04/08/17 0250  NA 137  < > 140  K 3.5  < > 3.7  CL 103  < > 107  CO2 25  < > 27  GLUCOSE 241*  < > 118*  BUN 17  < > 17  CREATININE 1.06  < > 1.11  CALCIUM 9.2  < > 9.1  AST 17  --   --   ALT 10*  --   --   ALKPHOS 72  --   --   BILITOT 0.7  --   --   < > = values in this interval not displayed. ------------------------------------------------------------------------------------------------------------------  Cardiac Enzymes  Recent Labs Lab 04/05/17 0203 04/05/17 0751 04/05/17 1257  TROPONINI 0.04* 0.06* 0.04*   ------------------------------------------------------------------------------------------------------------------  RADIOLOGY:  No results found.   ASSESSMENT AND PLAN:    61 year old male with history of CAD/CABG and diabetes who presented with chest pain.   1. Unstable angina: Stress test shows lateral wall ischemia and EF of 44%. Given anginal symptoms and ischemia stress test cardiology has recommended cardiac catheterization. Follow-up on results today. Continue heparin gtt    2. CAD: Continue aspirin, atorvastatin, Plavix Heart rate toprolol.  3. Essential hypertension: Continue isosorbide  4. Diabetes: Continue Lantus, ADA diet and sliding scale   Management plans discussed with the patient  and he is in agreement.  CODE STATUS: full  TOTAL TIME TAKING CARE OF THIS PATIENT: 22 minutes.     POSSIBLE D/C Monday/tuesday, DEPENDING ON CATH   Ansen Sayegh M.D on 04/08/2017 at 8:09  AM  Between 7am to 6pm - Pager - [phone] After 6pm go to [email or web address] - password [employer]  [facility]  Office  [phone]  CC: Primary care physician; Hyman Hopes, MD  Note: This dictation was prepared with Dragon dictation along with smaller phrase technology. Any transcriptional errors that result from this process are unintentional.

## 2017-04-08 NOTE — Progress Notes (Signed)
TR Band totally DC'd. Bandaid, 2x2, Sterile Tegaderm to site. Pt. Tolerated well. No complications noted to left radial site at present.

## 2017-04-08 NOTE — Progress Notes (Signed)
Pt. BG at 13:30 : 67 upon arrival to recovery. Pt. Totally asyptomatic. Gave pt. 240 ml juice and sandwich. Repeat BG  At 13:50: 101. Pt. States "I feel a lot better." Wife at bedside. Dr. Kirke Corin came to bedside and spoke with wife and pt. Re: PCI results. Both verbalized complete understanding of conversation with MD.

## 2017-04-08 NOTE — Progress Notes (Signed)
ANTICOAGULATION CONSULT NOTE - Initial Consult  Pharmacy Consult for Heparin Drip Indication: chest pain/ACS  Allergies  Allergen Reactions  . Accupril [Quinapril Hcl]     Skin rash,  Lip swelling     Patient Measurements: Height:  (180.3 cm) Weight: 210 lb 8 oz (95.5 kg) IBW/kg (Calculated) : 75.3 Heparin Dosing Weight: 94.5  Vital Signs: Temp: 98.1 F (36.7 C) (10/14 2119) Temp Source: Oral (10/14 2119) BP: 140/75 (10/14 2119) Pulse Rate: 70 (10/14 2119)  Labs:  Recent Labs  04/05/17 0751 04/05/17 1257 04/06/17 1233 04/06/17 1337  04/07/17 0525 04/07/17 1414 04/07/17 1511 04/08/17 0250  HGB 13.1  --  13.5  --   --  13.3  --   --  13.3  HCT 39.1*  --  40.4  --   --  39.5*  --   --  39.4*  PLT 216  --  240  --   --  231  --   --  235  APTT  --   --   --  30  --   --   --   --   --   LABPROT  --   --   --  13.4  --   --   --   --   --   INR  --   --   --  1.03  --   --   --   --   --   HEPARINUNFRC  --   --   --   --   < >  --  0.96* 0.98* 0.73*  CREATININE 1.07  --   --   --   --  0.93  --   --  1.11  TROPONINI 0.06* 0.04*  --   --   --   --   --   --   --   < > = values in this interval not displayed.  Estimated Creatinine Clearance: 84.5 mL/min (by C-G formula based on SCr of 1.11 mg/dL).   Medical History: Past Medical History:  Diagnosis Date  . Carotid arterial disease (HCC)    a. 03/2013 s/p L CEA;  b. 06/2015 Carotid U/S: RICA 1-39%, LICA patent.  . Coronary artery disease    a. 2002 s/p CABG x 3 (LIMA->LAD, VG->OM3, VG->AM;  b. 09/2010 NSTEMI/Cath: no culprits->Med Rx.  c. 02/2012 Cath/PCI: LM min irregs, LAD 13m, LCX 100, RCA 40p, 39m, AM 99, LIMA->LAD ok w/ sev diff apical LAD dzs (stable), VG->OM3 99 (3.0x12 Promus DES), VG->AM 25p, 60p.  . CVA (cerebral infarction)    a. on MRI 09/2010 - chronic infarct in the pons.  . Diabetes mellitus type 2, uncontrolled, with complications (HCC) DX: Age 62  . Hyperlipidemia   . Hypertension   .  Myocardial infarction (HCC) 2002  . Sinus bradycardia   . Stroke [facility])    a. ? 2006;  b. on MRI 09/2010 - chronic infarct in the pons.    Assessment: Patient received enoxaparin  at 6:00 this AM New consult for heparin for chest pain/ACS Baseline labs ordered, patient not on anticoagulation prior to admission.   Goal of Therapy:  Heparin level 0.3-0.7 units/ml Monitor platelets by anticoagulation protocol: Yes   Plan:  Current orders for heparin 900 units/hr. Heparin level supratherapeutic. Will reduce to 700 units/hr and recheck heparin level in 6 hours.  10/15 @ 0300 HL 0.73 supratherapeutic. Will decrease rate to 500 units/hr and will recheck HL @ 1100. CBC stable.  Thomasene Ripple, PharmD, BCPS Clinical Pharmacist 04/08/2017

## 2017-04-09 ENCOUNTER — Encounter: Payer: Self-pay | Admitting: Cardiovascular Disease

## 2017-04-09 DIAGNOSIS — E785 Hyperlipidemia, unspecified: Secondary | ICD-10-CM

## 2017-04-09 LAB — BASIC METABOLIC PANEL
Anion gap: 6 (ref 5–15)
BUN: 19 mg/dL (ref 6–20)
CALCIUM: 8.9 mg/dL (ref 8.9–10.3)
CO2: 25 mmol/L (ref 22–32)
Chloride: 105 mmol/L (ref 101–111)
Creatinine, Ser: 1.03 mg/dL (ref 0.61–1.24)
Glucose, Bld: 100 mg/dL — ABNORMAL HIGH (ref 65–99)
Potassium: 3.7 mmol/L (ref 3.5–5.1)
SODIUM: 136 mmol/L (ref 135–145)

## 2017-04-09 LAB — GLUCOSE, CAPILLARY
Glucose-Capillary: 89 mg/dL (ref 65–99)
Glucose-Capillary: 196 mg/dL — ABNORMAL HIGH (ref 65–99)

## 2017-04-09 LAB — CBC
HCT: 37.6 % — ABNORMAL LOW (ref 40.0–52.0)
HEMOGLOBIN: 12.6 g/dL — AB (ref 13.0–18.0)
MCH: 28.1 pg (ref 26.0–34.0)
MCHC: 33.6 g/dL (ref 32.0–36.0)
MCV: 83.8 fL (ref 80.0–100.0)
PLATELETS: 218 10*3/uL (ref 150–440)
RBC: 4.48 MIL/uL (ref 4.40–5.90)
RDW: 13.9 % (ref 11.5–14.5)
WBC: 5.7 10*3/uL (ref 3.8–10.6)

## 2017-04-09 LAB — POCT ACTIVATED CLOTTING TIME: ACTIVATED CLOTTING TIME: 252 s

## 2017-04-09 MED ORDER — INSULIN GLARGINE 100 UNIT/ML ~~LOC~~ SOLN
50.0000 [IU] | Freq: Every day | SUBCUTANEOUS | Status: DC
  Filled 2017-04-09: qty 0.5

## 2017-04-09 MED ORDER — INSULIN GLARGINE 100 UNIT/ML ~~LOC~~ SOLN: 50.0000 [IU] | SUBCUTANEOUS | Status: DC

## 2017-04-09 MED ORDER — ISOSORBIDE MONONITRATE ER 30 MG PO TB24: 30.0000 mg | ORAL_TABLET | Freq: Every day | ORAL | 0 refills | Status: AC

## 2017-04-09 MED ORDER — NITROGLYCERIN 0.4 MG SL SUBL: 0.4000 mg | SUBLINGUAL_TABLET | SUBLINGUAL | 0 refills | Status: AC | PRN

## 2017-04-09 NOTE — Progress Notes (Signed)
Progress Note  Patient Name: Shawn Hood Date of Encounter: 04/09/2017  Primary Cardiologist: Hochrein  Subjective   Feels much better today. No further chest pain or SOB. S/p LHC on 10/15 with successful PCI/DES to the native RCA as detailed below. Post LHC labs stable. Has ambulated without issues.   Inpatient Medications    Scheduled Meds: . aspirin EC  81 mg Oral Daily  . atorvastatin  80 mg Oral QHS  . clopidogrel  75 mg Oral Daily  . enoxaparin (LOVENOX) injection  40 mg Subcutaneous Q24H  . insulin aspart  0-5 Units Subcutaneous QHS  . insulin aspart  0-9 Units Subcutaneous TID WC  . insulin glargine  60 Units Subcutaneous [id number]  . isosorbide mononitrate  30 mg Oral Daily  . pantoprazole  40 mg Oral Daily  . sodium chloride flush  3 mL Intravenous Q12H  . sodium chloride flush  3 mL Intravenous Q12H  . sodium chloride flush  3 mL Intravenous Q12H   Continuous Infusions: . sodium chloride     PRN Meds: sodium chloride, acetaminophen, cyclobenzaprine, diphenhydrAMINE, fentaNYL (SUBLIMAZE) injection, morphine injection, nitroGLYCERIN, ondansetron (ZOFRAN) IV, sodium chloride flush   Vital Signs    Vitals:   04/08/17 1649 04/08/17 2007 04/09/17 0458 04/09/17 0758  BP: 139/69 (!) 149/70 131/60 (!) 143/73  Pulse: (!) 57 (!) 53 (!) 57 61  Resp: Temp: 98.2 F (36.8 C) 97.9 F (36.6 C) 98 F (36.7 C) 97.6 F (36.4 C)  TempSrc:  Oral Oral Oral  SpO2: 99% 100% 98% 100%  Weight:      Height:        Intake/Output Summary (Last 24 hours) at 04/09/17 0906 Last data filed at 04/09/17 0810  Gross per 24 hour  Intake              480 ml  Output             1150 ml  Net             -670 ml   Filed Weights   04/05/17 0535 04/08/17 0204 04/08/17 1121  Weight: 210 lb 8 oz (95.5 kg) 208 lb 3.2 oz (94.4 kg) 208 lb (94.3 kg)    Telemetry    Normal sinus rhythm - Personally Reviewed  ECG    Not done today - Personally Reviewed  Physical Exam    Alert and oriented 3. GEN: No acute distress.   Neck: No JVD Cardiac: RRR, no murmurs, rubs, or gallops. Cardiac cath site without swelling, active bleeding, bruising, or erythema. Radial pulse 2+. Respiratory: Clear to auscultation bilaterally. GI: Soft, nontender, non-distended  MS: No edema; No deformity. Neuro:  Nonfocal  Psych: Normal affect   Labs    Chemistry Recent Labs Lab 04/05/17 0203  04/07/17 0525 04/08/17 0250 04/09/17 0528  NA 137  < > 138 140 136  K 3.5  < > 3.5 3.7 3.7  CL 103  < > 105 107 105  CO2 25  < > GLUCOSE 241*  < > 84 118* 100*  BUN 17  < > CREATININE 1.06  < > 0.93 1.11 1.03  CALCIUM 9.2  < > 9.0 9.1 8.9  PROT 8.0  --   --   --   --   ALBUMIN 3.8  --   --   --   --   AST 17  --   --   --   --  ALT 10*  --   --   --   --   ALKPHOS 72  --   --   --   --   BILITOT 0.7  --   --   --   --   GFRNONAA >60  < > >60 >60 >60  GFRAA >60  < > >60 >60 >60  ANIONGAP 9  < > < > = values in this interval not displayed.   Hematology  Recent Labs Lab 04/07/17 0525 04/08/17 0250 04/09/17 0528  WBC 6.5 6.4 5.7  RBC 4.73 4.71 4.48  HGB 13.3 13.3 12.6*  HCT 39.5* 39.4* 37.6*  MCV 83.6 83.6 83.8  MCH 28.2 28.3 28.1  MCHC 33.7 33.9 33.6  RDW 13.7 13.7 13.9  PLT 231 235 218    Cardiac Enzymes  Recent Labs Lab 04/05/17 0203 04/05/17 0751 04/05/17 1257  TROPONINI 0.04* 0.06* 0.04*   No results for input(s): TROPIPOC in the last 168 hours.   BNPNo results for input(s): BNP, PROBNP in the last 168 hours.   DDimer No results for input(s): DDIMER in the last 168 hours.   Radiology    No results found.  Cardiac Studies   Nm Myocar Multi W/spect W/wall Motion / Ef  Result Date: 04/05/2017 Pharmacological myocardial perfusion imaging study with ischemia in the lateral wall Unable to exclude mild ischemia in the mid to apical inferior and inferolateral wall Septal wall motion/hypokinesis, EF estimated at 44% No EKG  changes concerning for ischemia at peak stress or in recovery. High risk scan Signed, Dossie Arbour, MD, Ph.D [facility]    TTE 04/06/2017: Study Conclusions  - Left ventricle: The cavity size was normal. Systolic function was   normal. The estimated ejection fraction was in the range of 55%   to 60%. Regional wall motion abnormalities cannot be excluded.   Features are consistent with a pseudonormal left ventricular   filling pattern, with concomitant abnormal relaxation and   increased filling pressure (grade 2 diastolic dysfunction). - Mitral valve: There was mild regurgitation. - Left atrium: The atrium was mildly dilated. - Right ventricle: Systolic function was normal. - Pulmonary arteries: Systolic pressure was within the normal   range.   LHC 04/08/2017: Conclusion     Prox Cx lesion, 100 %stenosed.  Prox LAD to Mid LAD lesion, 100 %stenosed.  Prox RCA lesion, 60 %stenosed.  Mid RCA lesion, 40 %stenosed.  1st RPLB lesion, 70 %stenosed.  LIMA and is normal in caliber and anatomically normal.  Dist LAD lesion, 40 %stenosed.  SVG graft was visualized by non-selective angiography.  Origin to Prox Graft lesion, 100 %stenosed.  SVG.  Origin to Prox Graft lesion, 95 %stenosed.  Ost RPDA to RPDA lesion, 99 %stenosed.  Post intervention, there is a 10% residual stenosis.  A stent was successfully placed.   1. Significant underlying three-vessel coronary artery disease with patent LIMA to LAD. Occluded SVG to OM at the previously placed stents in the ostium and severe proximal disease in the SVG to right PDA which is new. Extensive collaterals from the right coronary artery to the left circumflex. 2. Normal left ventricular end-diastolic pressure. 3. Successful angioplasty and drug-eluting stent placement to the right PDA. Moderate disease in the proximal and midright coronary artery was not significant by fractional flow reserve.  Recommendations: I elected  to intervene in the native RCA given the better long-term patency as his SVGs appeared to be failing. Recommend lifelong dual antiplatelet therapy  and aggressive treatment of risk factors.     Patient Profile     61 y.o. male with known CAD status post CABG and history of saphenous vein graft PCI in 2013 presenting with symptoms of unstable angina pectoris and borderline troponin.    Assessment & Plan    1. Unstable angina: Nuclear stress test showed evidence of lateral wall ischemia. He underwent LHC on 10/15 as detailed above with successful PCI/DES to the native RCA/RPDA. Continue lifelong DAPT with ASA 81 mg daily and Plavix 75 mg daily. Continue Imdur. Recommend cardiac rehab as an outpatient.   2. Hyperlipidemia: Continue high dose atorvastatin.  For questions or updates, please contact [facility] Please consult [email or web address] for contact info under Cardiology/STEMI.      Signed, Eula Listen, PA-C  04/09/2017, 9:06 AM

## 2017-04-09 NOTE — Discharge Summary (Signed)
[employer] - [place] at [facility]   PATIENT NAME: Shawn Hood    MR#:  [id number]  DATE OF BIRTH:  12-Apr-1956  DATE OF ADMISSION:  04/05/2017 ADMITTING PHYSICIAN: Ihor Austin, MD  DATE OF DISCHARGE: 04/09/2017  PRIMARY CARE PHYSICIAN: Hyman Hopes, MD    ADMISSION DIAGNOSIS:  Chest pain, unspecified type [R07.9]  DISCHARGE DIAGNOSIS:  Active Problems:   Chest pain   Unstable angina (HCC)   SECONDARY DIAGNOSIS:   Past Medical History:  Diagnosis Date  . Carotid arterial disease (HCC)    a. 03/2013 s/p L CEA;  b. 06/2015 Carotid U/S: RICA 1-39%, LICA patent.  . Coronary artery disease    a. 2002 s/p CABG x 3 (LIMA->LAD, VG->OM3, VG->AM;  b. 09/2010 NSTEMI/Cath: no culprits->Med Rx.  c. 02/2012 Cath/PCI: LM min irregs, LAD 177m, LCX 100, RCA 40p, 23m, AM 99, LIMA->LAD ok w/ sev diff apical LAD dzs (stable), VG->OM3 99 (3.0x12 Promus DES), VG->AM 25p, 60p.  . CVA (cerebral infarction)    a. on MRI 09/2010 - chronic infarct in the pons.  . Diabetes mellitus type 2, uncontrolled, with complications (HCC) DX: Age 50  . Hyperlipidemia   . Hypertension   . Myocardial infarction (HCC) 2002  . Sinus bradycardia   . Stroke [facility])    a. ? 2006;  b. on MRI 09/2010 - chronic infarct in the pons.    HOSPITAL COURSE:  61 year old male with history of CAD/CABG and diabetes who presented with chest pain.   1. Unstable angina: Patient was admitted to the hospital service.He underwent stress test. Stress test shows lateral wall ischemia and EF of 44%. Given anginal symptoms and ischemia stress test cardiology had recommended cardiac catheterization. Catheterization reveals severe native coronary artery disease as well as LIMA to LAD, occluded SVG to OM and severely stenosed proximal SVG to RPDA. Patient underwent successful PCI to proximal right coronary artery. Patient will continue with aggressive medical management and anti-platelet therapy with aspirin and  Plavix.  2. CAD: Continue aspirin, atorvastatin, Plavix, isosorbide Due to low heart rate unable to tolerate BB  3. Essential hypertension: Continue isosorbide  4. Diabetes: Continue outpatient regimen.  DISCHARGE CONDITIONS AND DIET:   Stable for discharge and diabetic heart healthy diet  CONSULTS OBTAINED:  Treatment Team:  Antonieta Iba, MD  DRUG ALLERGIES:   Allergies  Allergen Reactions  . Accupril [Quinapril Hcl]     Skin rash,  Lip swelling     DISCHARGE MEDICATIONS:   Current Discharge Medication List    START taking these medications   Details  isosorbide mononitrate (IMDUR) 30 MG 24 hr tablet Take 1 tablet (30 mg total) by mouth daily. Qty: 30 tablet, Refills: 0      CONTINUE these medications which have CHANGED   Details  nitroGLYCERIN (NITROSTAT) 0.4 MG SL tablet Place 1 tablet (0.4 mg total) under the tongue every 5 (five) minutes as needed for chest pain. Qty: 30 tablet, Refills: 0      CONTINUE these medications which have NOT CHANGED   Details  aspirin 81 MG tablet Take 1 tablet (81 mg total) by mouth daily. Qty: 30 tablet, Refills: 0    atorvastatin (LIPITOR) 80 MG tablet Take 80 mg by mouth at bedtime.    clopidogrel (PLAVIX) 75 MG tablet Take 75 mg by mouth daily.    HUMALOG MIX 75/25 KWIKPEN (75-25) 100 UNIT/ML Kwikpen 50 Units 2 (two) times daily.     losartan (COZAAR) 25 MG tablet Take  1 tablet by mouth daily.    metFORMIN (GLUCOPHAGE) 500 MG tablet Take 1,000 mg by mouth 2 (two) times daily with a meal.     cyclobenzaprine (FLEXERIL) 5 MG tablet Take 1 tablet (5 mg total) by mouth 3 (three) times daily as needed. Qty: 20 tablet, Refills: 0    diphenhydrAMINE (BENADRYL) 25 mg capsule Take 1 capsule (25 mg total) by mouth every 6 (six) hours as needed for itching. Qty: 30 capsule, Refills: 0    pantoprazole (PROTONIX) 40 MG tablet Take 1 tablet (40 mg total) by mouth daily. Qty: 60 tablet, Refills: 0    traMADol (ULTRAM) 50  MG tablet Take 1 tablet (50 mg total) by mouth every 6 (six) hours as needed. Qty: 20 tablet, Refills: 0      STOP taking these medications     insulin glargine (LANTUS) 100 UNIT/ML injection           Today   CHIEF COMPLAINT:   Feeling fine this morning. Ambulated without chest pain or shortness of breath.   VITAL SIGNS:  Blood pressure (!) 150/64, pulse 65, temperature 98 F (36.7 C), temperature source Oral, resp. rate 17, height  (1.803 m), weight 94.3 kg (208 lb), SpO2 96 %.   REVIEW OF SYSTEMS:  Review of Systems  Constitutional: Negative.  Negative for chills, fever and malaise/fatigue.  HENT: Negative.  Negative for ear discharge, ear pain, hearing loss, nosebleeds and sore throat.   Eyes: Negative.  Negative for blurred vision and pain.  Respiratory: Negative.  Negative for cough, hemoptysis, shortness of breath and wheezing.   Cardiovascular: Negative.  Negative for chest pain, palpitations and leg swelling.  Gastrointestinal: Negative.  Negative for abdominal pain, blood in stool, diarrhea, nausea and vomiting.  Genitourinary: Negative.  Negative for dysuria.  Musculoskeletal: Negative.  Negative for back pain.  Skin: Negative.   Neurological: Negative for dizziness, tremors, speech change, focal weakness, seizures and headaches.  Endo/Heme/Allergies: Negative.  Does not bruise/bleed easily.  Psychiatric/Behavioral: Negative.  Negative for depression, hallucinations and suicidal ideas.     PHYSICAL EXAMINATION:  GENERAL:  61 y.o.-year-old patient lying in the bed with no acute distress.  NECK:  Supple, no jugular venous distention. No thyroid enlargement, no tenderness.  LUNGS: Normal breath sounds bilaterally, no wheezing, rales,rhonchi  No use of accessory muscles of respiration.  CARDIOVASCULAR: S1, S2 normal. No murmurs, rubs, or gallops.  ABDOMEN: Soft, non-tender, non-distended. Bowel sounds present. No organomegaly or mass.  EXTREMITIES: No  pedal edema, cyanosis, or clubbing.  PSYCHIATRIC: The patient is alert and oriented x 3.  SKIN: No obvious rash, lesion, or ulcer.   DATA REVIEW:   CBC  Recent Labs Lab 04/09/17 0528  WBC 5.7  HGB 12.6*  HCT 37.6*  PLT 218    Chemistries   Recent Labs Lab 04/05/17 0203  04/09/17 0528  NA 137  < > 136  K 3.5  < > 3.7  CL 103  < > 105  CO2 25  < > 25  GLUCOSE 241*  < > 100*  BUN 17  < > 19  CREATININE 1.06  < > 1.03  CALCIUM 9.2  < > 8.9  AST 17  --   --   ALT 10*  --   --   ALKPHOS 72  --   --   BILITOT 0.7  --   --   < > = values in this interval not displayed.  Cardiac Enzymes  Recent Labs Lab  04/05/17 0203 04/05/17 0751 04/05/17 1257  TROPONINI 0.04* 0.06* 0.04*    Microbiology Results  @  RADIOLOGY:  No results found.    Current Discharge Medication List    START taking these medications   Details  isosorbide mononitrate (IMDUR) 30 MG 24 hr tablet Take 1 tablet (30 mg total) by mouth daily. Qty: 30 tablet, Refills: 0      CONTINUE these medications which have CHANGED   Details  nitroGLYCERIN (NITROSTAT) 0.4 MG SL tablet Place 1 tablet (0.4 mg total) under the tongue every 5 (five) minutes as needed for chest pain. Qty: 30 tablet, Refills: 0      CONTINUE these medications which have NOT CHANGED   Details  aspirin 81 MG tablet Take 1 tablet (81 mg total) by mouth daily. Qty: 30 tablet, Refills: 0    atorvastatin (LIPITOR) 80 MG tablet Take 80 mg by mouth at bedtime.    clopidogrel (PLAVIX) 75 MG tablet Take 75 mg by mouth daily.    HUMALOG MIX 75/25 KWIKPEN (75-25) 100 UNIT/ML Kwikpen 50 Units 2 (two) times daily.     losartan (COZAAR) 25 MG tablet Take 1 tablet by mouth daily.    metFORMIN (GLUCOPHAGE) 500 MG tablet Take 1,000 mg by mouth 2 (two) times daily with a meal.     cyclobenzaprine (FLEXERIL) 5 MG tablet Take 1 tablet (5 mg total) by mouth 3 (three) times daily as needed. Qty: 20 tablet, Refills: 0     diphenhydrAMINE (BENADRYL) 25 mg capsule Take 1 capsule (25 mg total) by mouth every 6 (six) hours as needed for itching. Qty: 30 capsule, Refills: 0    pantoprazole (PROTONIX) 40 MG tablet Take 1 tablet (40 mg total) by mouth daily. Qty: 60 tablet, Refills: 0    traMADol (ULTRAM) 50 MG tablet Take 1 tablet (50 mg total) by mouth every 6 (six) hours as needed. Qty: 20 tablet, Refills: 0      STOP taking these medications     insulin glargine (LANTUS) 100 UNIT/ML injection            Management plans discussed with the patient and he is in agreement. Stable for discharge home  Patient should follow up with cardiology  CODE STATUS:     Code Status Orders        Start     Ordered   04/05/17 0541  Full code  Continuous     04/05/17 0540    Code Status History    Date Active Date Inactive Code Status Order ID Comments User Context   07/03/2015  2:44 AM 07/04/2015  4:50 PM Full Code [id number]  Aldean Baker, MD ED   03/30/2013  5:51 PM 03/31/2013  7:18 PM Full Code [id number]  Dara Lords, PA-C Inpatient   03/26/2013  7:50 PM 03/30/2013  5:51 PM Full Code [id number]  Ozella Rocks, MD ED   09/04/2012 11:35 AM 09/05/2012  6:55 PM Full Code [id number]  Priscella Mann, DO ED   09/04/2012 10:49 AM 09/04/2012 11:35 AM Full Code [id number]  Priscella Mann, DO ED      TOTAL TIME TAKING CARE OF THIS PATIENT: 38 minutes.    Note: This dictation was prepared with Dragon dictation along with smaller phrase technology. Any transcriptional errors that result from this process are unintentional.  Islah Eve M.D on 04/09/2017 at 12:04 PM  Between 7am to 6pm - Pager - [phone] After 6pm go to [email or web address] - password [email or web address]  [facility]  Office  [phone]  CC: Primary care physician; Hyman Hopes, MD

## 2017-04-09 NOTE — Progress Notes (Addendum)
Inpatient Diabetes Program Recommendations  AACE/ADA: New Consensus Statement on Inpatient Glycemic Control (2015)  Target Ranges:  Prepandial:   less than 140 mg/dL      Peak postprandial:   less than 180 mg/dL (1-2 hours)      Critically ill patients:  140 - 180 mg/dL   Results for Shawn Hood, Shawn Hood (MRN [id number]) as of 04/09/2017 10:01  Ref. Range 04/08/2017 07:35 04/08/2017 11:30 04/08/2017 13:30 04/08/2017 13:55 04/08/2017 16:50 04/08/2017 20:49 04/09/2017 07:21  Glucose-Capillary Latest Ref Range: 65 - 99 mg/dL 80 79 66 782 (H) 956 (H) 171 (H) 89   Review of Glycemic Control  Diabetes history: DM2 Outpatient Diabetes medications: 75/25 50 units BID, Metformin 1000 mg BID Current orders for Inpatient glycemic control: Lantus 60 units QAM, Novolog 0-9 units TID with meals, Novolog 0-5 units QHS  Inpatient Diabetes Program Recommendations: Insulin - Basal: In reviewing chart, noted patient was NPO yesterday morning for heart cath. NO Lantus was given on 04/08/17 and fasting glucose 89 mg/dl this morning. Also noted patient refused Lantus today. Please consider decreasing Lantus to 50 units QHS to start today.  NOTE: Talked with patient over the phone regarding DM control and outpatient DM regimen. Patient confirms that he takes Humalog 75/25 50 units BID and Metformin 1000 mg BID as an outpatient. Inquired about whether patient every adjusted insulin dosages and patient states that if his glucose is trending on the low side he decreases the 75/25 dose to 30 units. Patient reports that he checks his glucose at home and it is usually in the 100's mg/dl. Inquired about prior A1C and patient reports that he is not sure but thinks it was in the the 8% range. Discussed A1C 8.7% on 04/05/17 and explained A1C indicates average glucose of 203 mg/dl. Discussed glucose and A1C goals and encouraged patient to work with PCP to further improve DM control. Noted Lantus was charted as NOT GIVEN (refused) this  morning with reason (patient reports he no longer takes Lantus). Explained while inpatient it is preferred not to use mixed insulins and discussed current insulin regimen as an inpatient. Informed patient a request to decrease Lantus dose would be requested to prevent any further hypoglycemia.  Thanks, Orlando Penner, RN, MSN, CDE Diabetes Coordinator Inpatient Diabetes Program [phone] (Team Pager from 8am to 5pm)

## 2017-04-09 NOTE — Progress Notes (Signed)
Shawn Hood to be D/C'd Home per MD order.  Discussed prescriptions and follow up appointments with the patient and wife. Medication list explained in detail. Pt verbalized understanding.  Allergies as of 04/09/2017      Reactions   Accupril [quinapril Hcl]    Skin rash,  Lip swelling       Medication List    TAKE these medications   aspirin 81 MG tablet Take 1 tablet (81 mg total) by mouth daily.   atorvastatin 80 MG tablet Commonly known as:  LIPITOR Take 80 mg by mouth at bedtime.   clopidogrel 75 MG tablet Commonly known as:  PLAVIX Take 75 mg by mouth daily.   cyclobenzaprine 5 MG tablet Commonly known as:  FLEXERIL Take 1 tablet (5 mg total) by mouth 3 (three) times daily as needed.   diphenhydrAMINE 25 mg capsule Commonly known as:  BENADRYL Take 1 capsule (25 mg total) by mouth every 6 (six) hours as needed for itching.   HUMALOG MIX 75/25 KWIKPEN (75-25) 100 UNIT/ML Kwikpen Generic drug:  Insulin Lispro Prot & Lispro 50 Units 2 (two) times daily.   isosorbide mononitrate 30 MG 24 hr tablet Commonly known as:  IMDUR Take 1 tablet (30 mg total) by mouth daily.   losartan 25 MG tablet Commonly known as:  COZAAR Take 1 tablet by mouth daily.   metFORMIN 500 MG tablet Commonly known as:  GLUCOPHAGE Take 1,000 mg by mouth 2 (two) times daily with a meal.   nitroGLYCERIN 0.4 MG SL tablet Commonly known as:  NITROSTAT Place 1 tablet (0.4 mg total) under the tongue every 5 (five) minutes as needed for chest pain.   pantoprazole 40 MG tablet Commonly known as:  PROTONIX Take 1 tablet (40 mg total) by mouth daily.   traMADol 50 MG tablet Commonly known as:  ULTRAM Take 1 tablet (50 mg total) by mouth every 6 (six) hours as needed.       Vitals:   04/09/17 0758 04/09/17 1109  BP: (!) 143/73 (!) 150/64  Pulse: 61 65  Resp: 18 17  Temp: 97.6 F (36.4 C) 98 F (36.7 C)  SpO2: 100% 96%    Skin clean, dry and intact without evidence of skin break  down, no evidence of skin tears noted. IV catheter discontinued intact. Site without signs and symptoms of complications. Dressing and pressure applied. Pt denies pain at this time. No complaints noted.  An After Visit Summary was printed and given to the patient. Patient escorted via WC, and D/C home via private auto.  Shawn Hood

## 2017-04-09 NOTE — Progress Notes (Signed)
Patient admitted to hospital with dx of chest pain/unstable angina.  Patient has known hx of CAD, CABG, CVA, DM Type 2, HLD, HTN.  Patient quit smoking in 1993.  Patient underwent nuclear stress test this admission which showed evidence of lateral wall ischemia.  Patient underwent LHC on 04/08/2017 with successful PCI/DES to native RCA/RPDA.  Patient on ASA, Plavix, Imdur, Atorvastatin.    Location of Stent placement discussed with patient.  Patient's left wrist vascular access site without hematoma or bleeding.  Dressing dry and intact.  Reviewed risk factors of heart disease with patient.  Stressed the importance of controlling blood pressure, cholesterol, and blood sugar.  Patient quit smoking in 1993.  Heart healthy diet also discussed with patient. Exercise discussed with patient.  Patient stated he was walking prior to having this problem with chest pain.  Patient also informed this RN that he participated in Cardiac Rehab when he had his bypass surgery.  Patient stated he enjoyed participating in the Cardiac Rehab program and would like to do so again.  An overview of the program was provided.  Patient scheduled for Cardiac Rehab orientation on Monday, April 15, 2017 at 10:30. Orientation packet given to patient. The various forms / questionnaires were reviewed with patient.    Patient does not have his insurance card with him, but informed this nurse that he is on disability and thinks he has AARP coverage?  Patient instructed to contact his insurance company to verify co-pay/coverage for Cardiac Rehab prior to attending orientation on Monday.  Patient agreed to do so.    Army Melia, RN, BSN, [facility] Cardiovascular and Pulmonary Nurse Navigator

## 2017-04-15 ENCOUNTER — Encounter: Payer: Medicare Other | Attending: Cardiovascular Disease | Admitting: *Deleted

## 2017-04-15 ENCOUNTER — Encounter: Payer: Self-pay | Admitting: *Deleted

## 2017-04-15 VITALS — Ht 71.2 in | Wt 212.2 lb

## 2017-04-15 DIAGNOSIS — Z48812 Encounter for surgical aftercare following surgery on the circulatory system: Secondary | ICD-10-CM | POA: Insufficient documentation

## 2017-04-15 DIAGNOSIS — Z955 Presence of coronary angioplasty implant and graft: Secondary | ICD-10-CM | POA: Diagnosis present

## 2017-04-15 NOTE — Patient Instructions (Signed)
Patient Instructions  Patient Details  Name: Shawn Hood MRN: [id number] Date of Birth: Jun 20, 1956 Referring Provider:  Iran OuchArida, Muhammad A, MD  Below are the personal goals you chose as well as exercise and nutrition goals. Our goal is to help you keep on track towards obtaining and maintaining your goals. We will be discussing your progress on these goals with you throughout the program.  Initial Exercise Prescription:     Initial Exercise Prescription - 04/15/17 1200      Date of Initial Exercise RX and Referring Provider   Date 04/15/17   Referring Provider Lorine BearsArida, Muhammad MD     Treadmill   MPH 2.2   Grade 1.5   Minutes 15   METs 3.14     NuStep   Level 3   SPM 80   Minutes 15   METs 3     Recumbant Elliptical   Level 2   RPM 50   Minutes 15   METs 3     Prescription Details   Frequency (times per week) 3   Duration Progress to 45 minutes of aerobic exercise without signs/symptoms of physical distress     Intensity   THRR 40-80% of Max Heartrate 101-140   Ratings of Perceived Exertion 11-13   Perceived Dyspnea 0-4     Progression   Progression Continue to progress workloads to maintain intensity without signs/symptoms of physical distress.     Resistance Training   Training Prescription Yes   Weight 3 lbs   Reps 10-15      Exercise Goals: Frequency: Be able to perform aerobic exercise three times per week working toward 3-5 days per week.  Intensity: Work with a perceived exertion of 11 (fairly light) - 15 (hard) as tolerated. Follow your new exercise prescription and watch for changes in prescription as you progress with the program. Changes will be reviewed with you when they are made.  Duration: You should be able to do 30 minutes of continuous aerobic exercise in addition to a 5 minute warm-up and a 5 minute cool-down routine.  Nutrition Goals: Your personal nutrition goals will be established when you do your nutrition analysis with the  dietician.  The following are nutrition guidelines to follow: Cholesterol < 200mg /day Sodium < 1500mg /day Fiber: Men over 50 yrs - 30 grams per day  Personal Goals:     Personal Goals and Risk Factors at Admission - 04/15/17 1216      Core Components/Risk Factors/Patient Goals on Admission    Weight Management Weight Loss;Yes  Shawn Hood stated today that he would like to lose a few lbs.   Intervention Weight Management/Obesity: Establish reasonable short term and long term weight goals.   Admit Weight 212 lb 3.2 oz (96.3 kg)   Goal Weight: Short Term 207 lb (93.9 kg)   Goal Weight: Long Term 200 lb (90.7 kg)   Expected Outcomes Short Term: Continue to assess and modify interventions until short term weight is achieved;Weight Loss: Understanding of general recommendations for a balanced deficit meal plan, which promotes 1-2 lb weight loss per week and includes a negative energy balance of [phone] kcal/d;Long Term: Adherence to nutrition and physical activity/exercise program aimed toward attainment of established weight goal;Understanding recommendations for meals to include 15-35% energy as protein, 25-35% energy from fat, 35-60% energy from carbohydrates, less than 200mg  of dietary cholesterol, 20-35 gm of total fiber daily;Understanding of distribution of calorie intake throughout the day with the consumption of 4-5 meals/snacks   Diabetes Yes  Intervention Provide education about signs/symptoms and action to take for hypo/hyperglycemia.;Provide education about proper nutrition, including hydration, and aerobic/resistive exercise prescription along with prescribed medications to achieve blood glucose in normal ranges: Fasting glucose 65-99 mg/dL   Expected Outcomes Short Term: Participant verbalizes understanding of the signs/symptoms and immediate care of hyper/hypoglycemia, proper foot care and importance of medication, aerobic/resistive exercise and nutrition plan for blood glucose  control.;Long Term: Attainment of HbA1C < 7%.   Hypertension Yes   Intervention Provide education on lifestyle modifcations including regular physical activity/exercise, weight management, moderate sodium restriction and increased consumption of fresh fruit, vegetables, and low fat dairy, alcohol moderation, and smoking cessation.;Monitor prescription use compliance.   Expected Outcomes Short Term: Continued assessment and intervention until BP is < 140/67mm HG in hypertensive participants. < 130/24mm HG in hypertensive participants with diabetes, heart failure or chronic kidney disease.;Long Term: Maintenance of blood pressure at goal levels.   Lipids Yes   Intervention Provide education and support for participant on nutrition & aerobic/resistive exercise along with prescribed medications to achieve LDL 70mg , HDL >40mg .   Expected Outcomes Short Term: Participant states understanding of desired cholesterol values and is compliant with medications prescribed. Participant is following exercise prescription and nutrition guidelines.;Long Term: Cholesterol controlled with medications as prescribed, with individualized exercise RX and with personalized nutrition plan. Value goals: LDL < 70mg , HDL > 40 mg.   Stress Yes   Intervention Offer individual and/or small group education and counseling on adjustment to heart disease, stress management and health-related lifestyle change. Teach and support self-help strategies.;Refer participants experiencing significant psychosocial distress to appropriate mental health specialists for further evaluation and treatment. When possible, include family members and significant others in education/counseling sessions.   Expected Outcomes Short Term: Participant demonstrates changes in health-related behavior, relaxation and other stress management skills, ability to obtain effective social support, and compliance with psychotropic medications if prescribed.;Long Term:  Emotional wellbeing is indicated by absence of clinically significant psychosocial distress or social isolation.      Tobacco Use Initial Evaluation: History  Smoking Status  . Former Smoker  . Packs/day: 1.50  . Years: 10.00  . Types: Cigarettes  . Quit date: 06/26/1991  Smokeless Tobacco  . Never Used    Comment: Quit 1993    Exercise Goals and Review:     Exercise Goals    Row Name 04/15/17 1248             Exercise Goals   Increase Physical Activity Yes       Intervention Provide advice, education, support and counseling about physical activity/exercise needs.;Develop an individualized exercise prescription for aerobic and resistive training based on initial evaluation findings, risk stratification, comorbidities and participant's personal goals.       Expected Outcomes Achievement of increased cardiorespiratory fitness and enhanced flexibility, muscular endurance and strength shown through measurements of functional capacity and personal statement of participant.       Increase Strength and Stamina Yes       Intervention Provide advice, education, support and counseling about physical activity/exercise needs.;Develop an individualized exercise prescription for aerobic and resistive training based on initial evaluation findings, risk stratification, comorbidities and participant's personal goals.       Expected Outcomes Achievement of increased cardiorespiratory fitness and enhanced flexibility, muscular endurance and strength shown through measurements of functional capacity and personal statement of participant.       Able to understand and use rate of perceived exertion (RPE) scale Yes  Intervention Provide education and explanation on how to use RPE scale       Expected Outcomes Short Term: Able to use RPE daily in rehab to express subjective intensity level;Long Term:  Able to use RPE to guide intensity level when exercising independently       Knowledge and  understanding of Target Heart Rate Range (THRR) Yes       Intervention Provide education and explanation of THRR including how the numbers were predicted and where they are located for reference       Expected Outcomes Short Term: Able to state/look up THRR;Long Term: Able to use THRR to govern intensity when exercising independently;Short Term: Able to use daily as guideline for intensity in rehab       Able to check pulse independently Yes       Intervention Provide education and demonstration on how to check pulse in carotid and radial arteries.;Review the importance of being able to check your own pulse for safety during independent exercise       Expected Outcomes Short Term: Able to explain why pulse checking is important during independent exercise;Long Term: Able to check pulse independently and accurately       Understanding of Exercise Prescription Yes       Intervention Provide education, explanation, and written materials on patient's individual exercise prescription       Expected Outcomes Short Term: Able to explain program exercise prescription;Long Term: Able to explain home exercise prescription to exercise independently          Copy of goals given to participant.
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
## 2017-04-15 NOTE — Progress Notes (Signed)
Daily Session Note  Patient Details  Name: Shawn Hood MRN: [id number] Date of Birth: Sep 28, 1955 Referring Provider:    Encounter Date: 04/15/2017  Check In:     Session Check In - 04/15/17 1202      Check-In   Location [facility]-Cardiac & Pulmonary Rehab   Staff Present Alberteen Sam, MA, ACSM RCEP, Exercise Physiologist;Susanne Bice, RN, BSN, CCRP;Isyss Espinal, RN, BSN   Supervising physician immediately available to respond to emergencies See telemetry face sheet for immediately available ER MD   Medication changes reported     No   Fall or balance concerns reported    No   Tobacco Cessation No Change   Warm-up and Cool-down Performed as group-led instruction   Resistance Training Performed Yes   VAD Patient? No     Pain Assessment   Currently in Pain? No/denies  Does have neck and shoulder pain but stated none right now         History  Smoking Status  . Former Smoker  . Packs/day: 1.50  . Years: 10.00  . Types: Cigarettes  . Quit date: 06/26/1991  Smokeless Tobacco  . Never Used    Comment: Quit 1993    Goals Met:  Proper associated with RPD/PD & O2 Sat Exercise tolerated well Personal goals reviewed No report of cardiac concerns or symptoms Strength training completed today  Goals Unmet:  Not Applicable  Comments:     Dr. Emily Filbert is Medical Director for [facility] and [facility].
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
## 2017-04-16 ENCOUNTER — Encounter: Payer: Self-pay | Admitting: Cardiovascular Disease

## 2017-04-16 ENCOUNTER — Ambulatory Visit (INDEPENDENT_AMBULATORY_CARE_PROVIDER_SITE_OTHER): Payer: Medicare Other | Admitting: Cardiovascular Disease

## 2017-04-16 VITALS — BP 122/68 | HR 62 | Ht 71.0 in | Wt 216.8 lb

## 2017-04-16 DIAGNOSIS — I25708 Atherosclerosis of coronary artery bypass graft(s), unspecified, with other forms of angina pectoris: Secondary | ICD-10-CM

## 2017-04-16 DIAGNOSIS — E7800 Pure hypercholesterolemia, unspecified: Secondary | ICD-10-CM

## 2017-04-16 DIAGNOSIS — I779 Disorder of arteries and arterioles, unspecified: Secondary | ICD-10-CM | POA: Diagnosis not present

## 2017-04-16 DIAGNOSIS — I739 Peripheral vascular disease, unspecified: Secondary | ICD-10-CM

## 2017-04-16 DIAGNOSIS — I10 Essential (primary) hypertension: Secondary | ICD-10-CM | POA: Diagnosis not present

## 2017-04-16 NOTE — Progress Notes (Signed)
Cardiology Office Note   Date:  04/16/2017   ID:  Shawn Hood, DOB 1956-04-19, MRN [id number]  PCP:  Hyman Hopes, MD  Cardiologist:   Lorine Bears, MD   Chief Complaint  Patient presents with  . other    Follow up from [facility] cardiac cath s/p stent placement. Reviewed meds with pt verbally.      History of Present Illness: Shawn Hood is a 61 y.o. male who presents for for follow-up visit after recent hospitalization for non-ST elevation myocardial infarction. He has known history of coronary artery disease status post CABG, carotid disease status post left CEA, stroke, hyperlipidemia, hypertension and diabetes mellitus. He presented recently to [facility] with chest pain and was found to have a small non-ST elevation myocardial infarction.  He initially elected for a stress test which showed evidence of lateral wall ischemia.  Echocardiogram showed normal LV systolic function with mild mitral regurgitation. I proceeded with cardiac catheterization via the left radial artery which showed significant underlying three-vessel coronary artery disease with patent LIMA to LAD, occluded SVG to OM at the previously placed stents in the ostium and severe proximal disease in SVG to right PDA which was new.  Extensive collaterals from the right coronary artery to the left circumflex were noted.  Ventricular end-diastolic pressure was normal.  There was also moderate disease in the proximal native right coronary artery which was not significant by fractional flow reserve.  I performed successful angioplasty and drug-eluting stent placement to the right PDA.  He has been doing well since then with no recurrent chest pain.  He is now taking his medications regularly.  He is going to start cardiac rehab tomorrow.  No side effects with medications.  Past Medical History:  Diagnosis Date  . CAD (coronary artery disease)    Non-ST elevation myocardial infarction in October 2018.  Cardiac  catheterization showed patent LIMA to LAD, occluded SVG to OM and severe disease and SVG to right PDA.  He underwent successful angioplasty and drug-eluting stent placement to the proximal right PDA.  . Carotid arterial disease (HCC)    a. 03/2013 s/p L CEA;  b. 06/2015 Carotid U/S: RICA 1-39%, LICA patent.  . Coronary artery disease    a. 2002 s/p CABG x 3 (LIMA->LAD, VG->OM3, VG->AM;  b. 09/2010 NSTEMI/Cath: no culprits->Med Rx.  c. 02/2012 Cath/PCI: LM min irregs, LAD 170m, LCX 100, RCA 40p, 50m, AM 99, LIMA->LAD ok w/ sev diff apical LAD dzs (stable), VG->OM3 99 (3.0x12 Promus DES), VG->AM 25p, 60p.  . CVA (cerebral infarction)    a. on MRI 09/2010 - chronic infarct in the pons.  . Diabetes mellitus type 2, uncontrolled, with complications (HCC) DX: Age 78  . Hyperlipidemia   . Hypertension   . Myocardial infarction (HCC) 2002  . Sinus bradycardia   . Stroke [facility])    a. ? 2006;  b. on MRI 09/2010 - chronic infarct in the pons.    Past Surgical History:  Procedure Laterality Date  . CHOLECYSTECTOMY    . CORONARY ANGIOPLASTY WITH STENT PLACEMENT  2013  . CORONARY ARTERY BYPASS GRAFT  2002  . CORONARY STENT INTERVENTION N/A 04/08/2017   Procedure: CORONARY STENT INTERVENTION;  Surgeon: Iran Ouch, MD;  Location: [facility] INVASIVE CV LAB;  Service: Cardiovascular;  Laterality: N/A;  . ENDARTERECTOMY Left 03/30/2013   Procedure: ENDARTERECTOMY CAROTID;  Surgeon: Larina Earthly, MD;  Location: [facility] OR;  Service: Vascular;  Laterality: Left;  . heart  bypass    . INTRAVASCULAR PRESSURE WIRE/FFR STUDY N/A 04/08/2017   Procedure: INTRAVASCULAR PRESSURE WIRE/FFR STUDY;  Surgeon: Iran OuchArida, Aarin Sparkman A, MD;  Location: [facility] INVASIVE CV LAB;  Service: Cardiovascular;  Laterality: N/A;  . LEFT HEART CATH AND CORONARY ANGIOGRAPHY N/A 04/08/2017   Procedure: LEFT HEART CATH AND CORONARY ANGIOGRAPHY;  Surgeon: Iran OuchArida, Merian Wroe A, MD;  Location: [facility] INVASIVE CV LAB;  Service: Cardiovascular;  Laterality: N/A;  .  LEFT HEART CATHETERIZATION WITH CORONARY/GRAFT ANGIOGRAM N/A 02/29/2012   Procedure: LEFT HEART CATHETERIZATION WITH Isabel CapriceORONARY/GRAFT ANGIOGRAM;  Surgeon: Rollene RotundaJames Hochrein, MD;  [facility];  Service: Cardiovascular;  Laterality: N/A;  . PERCUTANEOUS CORONARY STENT INTERVENTION (PCI-S)  02/29/2012   Procedure: PERCUTANEOUS CORONARY STENT INTERVENTION (PCI-S);  Surgeon: Rollene RotundaJames Hochrein, MD;  [facility];  Service: Cardiovascular;;  . SHOULDER SURGERY Left      Current Outpatient Prescriptions  Medication Sig Dispense Refill  . aspirin 81 MG tablet Take 1 tablet (81 mg total) by mouth daily. 30 tablet 0  . atorvastatin (LIPITOR) 80 MG tablet Take 80 mg by mouth at bedtime.    . clopidogrel (PLAVIX) 75 MG tablet Take 75 mg by mouth daily.    . cyclobenzaprine (FLEXERIL) 5 MG tablet Take 1 tablet (5 mg total) by mouth 3 (three) times daily as needed. 20 tablet 0  . diphenhydrAMINE (BENADRYL) 25 mg capsule Take 1 capsule (25 mg total) by mouth every 6 (six) hours as needed for itching. 30 capsule 0  . HUMALOG MIX 75/25 KWIKPEN (75-25) 100 UNIT/ML Kwikpen 50 Units 2 (two) times daily.     . isosorbide mononitrate (IMDUR) 30 MG 24 hr tablet Take 1 tablet (30 mg total) by mouth daily. 30 tablet 0  . losartan (COZAAR) 25 MG tablet Take 1 tablet by mouth daily.    . metFORMIN (GLUCOPHAGE) 500 MG tablet Take 1,000 mg by mouth 2 (two) times daily with a meal.     . nitroGLYCERIN (NITROSTAT) 0.4 MG SL tablet Place 1 tablet (0.4 mg total) under the tongue every 5 (five) minutes as needed for chest pain. 30 tablet 0  . pantoprazole (PROTONIX) 40 MG tablet Take 1 tablet (40 mg total) by mouth daily. 60 tablet 0  . traMADol (ULTRAM) 50 MG tablet Take 1 tablet (50 mg total) by mouth every 6 (six) hours as needed. 20 tablet 0   No current facility-administered medications for this visit.     Allergies:   Accupril [quinapril hcl]    Social History:  The patient  reports that he quit smoking about  25 years ago. His smoking use included Cigarettes. He has a 15.00 pack-year smoking history. He has never used smokeless tobacco. He reports that he does not drink alcohol or use drugs.   Family History:  The patient's family history includes Coronary artery disease in his brother and mother; Diabetes in his brother, mother, and sister; Heart disease in his brother and mother; Hypertension in his brother and mother.    ROS:  Please see the history of present illness.   Otherwise, review of systems are positive for none.   All other systems are reviewed and negative.    PHYSICAL EXAM: VS:  BP 122/68 (BP Location: Right Arm, Patient Position: Sitting, Cuff Size: Normal)   Pulse 62   Ht 5\' 11"  (1.803 m)   Wt 216 lb 12 oz (98.3 kg)   BMI 30.23 kg/m  , BMI Body mass index is 30.23 kg/m. GEN: Well nourished, well developed,  in no acute distress  HEENT: normal  Neck: no JVD, carotid bruits, or masses Cardiac: RRR; no murmurs, rubs, or gallops,no edema  Respiratory:  clear to auscultation bilaterally, normal work of breathing GI: soft, nontender, nondistended, + BS MS: no deformity or atrophy  Skin: warm and dry, no rash Neuro:  Strength and sensation are intact Psych: euthymic mood, full affect Left radial pulse is normal with no hematoma   EKG:  EKG is ordered today. The ekg ordered today demonstrates normal sinus rhythm, LVH, lateral T wave changes suggestive of ischemia.   Recent Labs: 04/05/2017: ALT 10 04/09/2017: BUN 19; Creatinine, Ser 1.03; Hemoglobin 12.6; Platelets 218; Potassium 3.7; Sodium 136    Lipid Panel    Component Value Date/Time   CHOL 206 (H) 04/05/2017 0751   TRIG 48 04/05/2017 0751   HDL 33 (L) 04/05/2017 0751   CHOLHDL 6.2 04/05/2017 0751   VLDL 10 04/05/2017 0751   LDLCALC 163 (H) 04/05/2017 0751      Wt Readings from Last 3 Encounters:  04/16/17 216 lb 12 oz (98.3 kg)  04/15/17 212 lb 3.2 oz (96.3 kg)  04/08/17 208 lb (94.3 kg)       No  flowsheet data found.    ASSESSMENT AND PLAN:  1.  Coronary artery disease involving graft bypass with recent non-ST elevation myocardial infarction: Status post successful angioplasty and drug-eluting stent placement to the right PDA.  Both vein grafts are now occluded/sub-occluded.  LIMA to LAD continues to be normal and RCA was recently treated with PCI.  He might potentially be ischemic in the left circumflex territory but he has very good collaterals from the right coronary artery.  I recommend continuing medical therapy.  I discussed with him the importance of taking his medications regularly and controlling his risk factors. He is going to start cardiac rehab tomorrow.  Continue dual antiplatelet therapy for at least one year.  2.  Hyperlipidemia: He was not taking atorvastatin recently and his LDL was significantly elevated.  Repeat lipid and liver profile in 1 month.  3.  Essential hypertension: Blood pressure is controlled on current medications.  4.  Carotid artery disease: Followed by Dr. Arbie Cookey.    Disposition:   FU with me in 3 months  Signed,  Lorine Bears, MD  04/16/2017 5:07 PM    [facility]

## 2017-04-16 NOTE — Patient Instructions (Addendum)
Medication Instructions:  Your physician recommends that you continue on your current medications as directed. Please refer to the Current Medication list given to you today.   Labwork: [facility] No appt necessary. Nothing to eat or drink after midnight the evening before your labs.   Testing/Procedures: none  Follow-Up: Your physician recommends that you schedule a follow-up appointment in: 3 months with Dr. Kirke Hood.    Any Other Special Instructions Will Be Listed Below (If Applicable).     If you need a refill on your cardiac medications before your next appointment, please call your pharmacy.  Lipid Profile Test Why am I having this test? The lipid profile test gives results that can help predict the likelihood of developing heart disease. The test is also used to monitor treatment for high cholesterol to see if you are reaching your goals. A lipid profile measures the following:  Total cholesterol. Cholesterol is a waxy fat in your blood. If your total cholesterol is elevated, this can increase your risk of coronary heart disease.  High-density lipoprotein (HDL). This is known as the good cholesterol. Having a high level of HDL is good. Your HDL level may be low if you smoke or do not get enough exercise.  Low-density lipoprotein (LDL). This is known as the bad cholesterol and is responsible for the formation of plaque in the arteries. Having a low level of LDL is best.  Cholesterol to HDL ratio. This is calculated by dividing the total cholesterol by the HDL cholesterol. The ratio is used by health care providers for determining your risk of heart disease. A low ratio is best.  Triglycerides. These are a type of fat in the blood responsible for providing energy to your cells. Low levels are best.  What kind of sample is taken? A blood sample is required for this test. It is usually collected by inserting a needle into  a vein. How do I prepare for this test? Do not eat or drink anything after midnight on the night before the test or as directed by your health care provider. What are the reference ranges? Reference ranges are considered healthy ranges established after testing a large group of healthy people. Reference ranges may vary among different people, labs, and hospitals. It is your responsibility to obtain your test results. Ask the lab or department performing the test when and how you will get your results. Reference ranges for the lipid profile test are as follows: Total Cholesterol  Adult or elderly: less than 200 mg/dL or less than 1.615.20 mmol/L (SI units).  Child: 120-200 mg/dL.  Infant: 70-175 mg/dL.  Newborns: 53-135 mg/dL. HDL  Male: greater than 45 mg/dL or greater than 0.960.75 mmol/L (SI units).  Male: greater than 55 mg/dL or greater than 0.450.91 mmol/L (SI units). HDL reference values based on risk of heart disease:  For low risk of heart disease: ? Male: 60 mg/dL or 4.091.55 mmol/L. ? Male: 70 mg/dL or 8.111.81 mmol/L.  For moderate risk of heart disease: ? Male: 45 mg/dL or 9.141.17 mmol/L. ? Male: 55 mg/dL or 7.821.42 mmol/L.  For high risk of heart disease: ? Male: 25 mg/dL or 9.560.65 mmol/L. ? Male: 35 mg/dL or 2.130.90 mmol/L.  LDL  Adult: less than 130 mg/dL.  Children: less than 110 mg/dL. Cholesterol to HDL Ratio Reference values based on risk for coronary heart disease:  Risk that is one half average: ? Male: 3.4. ? Male:  3.3.  Average risk: ? Male: 5.0. ? Male: 4.4.  Risk that is two times average (moderate risk): ? Male: 10.0. ? Male: 7.0.  Risk that is three times average (high risk): ? Male: 24.0. ? Male: 11.0.  Triglycerides  Adult or elderly: ? Male: 40-160 mg/dL or 1.61-0.96 mmol/L (SI units). ? Male: 35-135 mg/dL or 0.45-4.09 mmol/L (SI units).  Children 55-89 years old: ? Male: 30-86 mg/dL. ? Male: 32-99 mg/dL.  Children 6-11 years  old: ? Male: 31-108 mg/dL. ? Male: 35-114 mg/dL.  Children 42-53 years old: ? Male: 36-138 mg/dL. ? Male: 41-138 mg/dL.  Children 54-66 years old: ? Male: 40-163 mg/dL. ? Male: 40-128 mg/dL. Triglycerides should be less than 400 mg/dL even when you are not fasting. What do the results mean? Talk with your health care provider to discuss your results, treatment options, and if necessary, the need for more tests. Talk with your health care provider if you have any questions about your results. Talk with your health care provider to discuss your results, treatment options, and if necessary, the need for more tests. Talk with your health care provider if you have any questions about your results. This information is not intended to replace advice given to you by your health care provider. Make sure you discuss any questions you have with your health care provider. Document Released: 07/05/2004 Document Revised: 02/15/2016 Document Reviewed: 10/01/2013 Elsevier Interactive Patient Education  [employer].

## 2017-04-17 ENCOUNTER — Encounter: Payer: Medicare Other | Admitting: *Deleted

## 2017-04-17 DIAGNOSIS — Z955 Presence of coronary angioplasty implant and graft: Secondary | ICD-10-CM

## 2017-04-17 DIAGNOSIS — Z48812 Encounter for surgical aftercare following surgery on the circulatory system: Secondary | ICD-10-CM | POA: Diagnosis not present

## 2017-04-17 LAB — GLUCOSE, CAPILLARY: Glucose-Capillary: 160 mg/dL — ABNORMAL HIGH (ref 65–99)

## 2017-04-17 NOTE — Progress Notes (Signed)
Daily Session Note  Patient Details  Name: Shawn Hood MRN: [id number] Date of Birth: 1955-11-30 Referring Provider:     Cardiac Rehab from 04/15/2017 in [facility] Cardiac and Pulmonary Rehab  Referring Provider  Kathlyn Sacramento MD      Encounter Date: 04/17/2017  Check In:     Session Check In - 04/17/17 1630      Check-In   Location [facility]-Cardiac & Pulmonary Rehab   Staff Present Renita Papa, RN Vickki Hearing, BA, ACSM CEP, Exercise Physiologist;Carroll Enterkin, RN, BSN   Supervising physician immediately available to respond to emergencies See telemetry face sheet for immediately available ER MD   Medication changes reported     No   Fall or balance concerns reported    No   Warm-up and Cool-down Performed on first and last piece of equipment   Resistance Training Performed Yes   VAD Patient? No     Pain Assessment   Currently in Pain? No/denies         History  Smoking Status  . Former Smoker  . Packs/day: 1.50  . Years: 10.00  . Types: Cigarettes  . Quit date: 06/26/1991  Smokeless Tobacco  . Never Used    Comment: Quit 1993    Goals Met:  Independence with exercise equipment Exercise tolerated well No report of cardiac concerns or symptoms Strength training completed today  Goals Unmet:  Not Applicable  Comments: First full day of exercise!  Patient was oriented to gym and equipment including functions, settings, policies, and procedures.  Patient's individual exercise prescription and treatment plan were reviewed.  All starting workloads were established based on the results of the 6 minute walk test done at initial orientation visit.  The plan for exercise progression was also introduced and progression will be customized based on patient's performance and goals.    Dr. Emily Filbert is Medical Director for [place] and [facility].

## 2017-04-18 ENCOUNTER — Encounter: Payer: Medicare Other | Admitting: *Deleted

## 2017-04-18 DIAGNOSIS — Z48812 Encounter for surgical aftercare following surgery on the circulatory system: Secondary | ICD-10-CM | POA: Diagnosis not present

## 2017-04-18 DIAGNOSIS — Z955 Presence of coronary angioplasty implant and graft: Secondary | ICD-10-CM

## 2017-04-18 LAB — GLUCOSE, CAPILLARY
GLUCOSE-CAPILLARY: 114 mg/dL — AB (ref 65–99)
Glucose-Capillary: 113 mg/dL — ABNORMAL HIGH (ref 65–99)

## 2017-04-18 NOTE — Progress Notes (Signed)
Daily Session Note  Patient Details  Name: RANELL SKIBINSKI MRN: [id number] Date of Birth: Mar 18, 1956 Referring Provider:     Cardiac Rehab from 04/15/2017 in [facility] Cardiac and Pulmonary Rehab  Referring Provider  Kathlyn Sacramento MD      Encounter Date: 04/18/2017  Check In:     Session Check In - 04/18/17 1646      Check-In   Location [facility]-Cardiac & Pulmonary Rehab   Staff Present Earlean Shawl, BS, ACSM CEP, Exercise Physiologist;Joseph Tessie Fass RCP,RRT,BSRT;Carroll Enterkin, RN, BSN   Supervising physician immediately available to respond to emergencies See telemetry face sheet for immediately available ER MD   Medication changes reported     No   Fall or balance concerns reported    No   Warm-up and Cool-down Performed on first and last piece of equipment   Resistance Training Performed Yes   VAD Patient? No     Pain Assessment   Currently in Pain? No/denies   Multiple Pain Sites No         History  Smoking Status  . Former Smoker  . Packs/day: 1.50  . Years: 10.00  . Types: Cigarettes  . Quit date: 06/26/1991  Smokeless Tobacco  . Never Used    Comment: Quit 1993    Goals Met:  Independence with exercise equipment Exercise tolerated well No report of cardiac concerns or symptoms Strength training completed today  Goals Unmet:  Not Applicable  Comments: Pt able to follow exercise prescription today without complaint.  Will continue to monitor for progression.    Dr. Emily Filbert is Medical Director for [place] and [facility].

## 2017-04-22 DIAGNOSIS — Z955 Presence of coronary angioplasty implant and graft: Secondary | ICD-10-CM

## 2017-04-22 DIAGNOSIS — Z48812 Encounter for surgical aftercare following surgery on the circulatory system: Secondary | ICD-10-CM | POA: Diagnosis not present

## 2017-04-22 LAB — GLUCOSE, CAPILLARY
Glucose-Capillary: 188 mg/dL — ABNORMAL HIGH (ref 65–99)
Glucose-Capillary: 90 mg/dL (ref 65–99)

## 2017-04-22 NOTE — Progress Notes (Signed)
Daily Session Note  Patient Details  Name: TONIO SEIDER MRN: [id number] Date of Birth: [birth year] Referring Provider:     Cardiac Rehab from 04/15/2017 in [facility] Cardiac and Pulmonary Rehab  Referring Provider  Kathlyn Sacramento MD      Encounter Date: 04/22/2017  Check In:     Session Check In - 04/22/17 1732      Check-In   Location [facility]-Cardiac & Pulmonary Rehab   Staff Present Nada Maclachlan, BA, ACSM CEP, Exercise Physiologist;Kelly Amedeo Plenty, BS, ACSM CEP, Exercise Physiologist;Meredith Sherryll Burger, RN BSN   Supervising physician immediately available to respond to emergencies See telemetry face sheet for immediately available ER MD   Medication changes reported     No   Fall or balance concerns reported    No   Warm-up and Cool-down Performed on first and last piece of equipment   Resistance Training Performed Yes   VAD Patient? No     Pain Assessment   Currently in Pain? No/denies   Multiple Pain Sites No         History  Smoking Status  . Former Smoker  . Packs/day: 1.50  . Years: 10.00  . Types: Cigarettes  . Quit date: 06/26/1991  Smokeless Tobacco  . Never Used    Comment: Quit 1993    Goals Met:  Independence with exercise equipment Exercise tolerated well No report of cardiac concerns or symptoms Strength training completed today  Goals Unmet:  Not Applicable  Comments: Pt able to follow exercise prescription today without complaint.  Will continue to monitor for progression.    Dr. Emily Filbert is Medical Director for [place] and [facility].

## 2017-04-24 ENCOUNTER — Encounter: Payer: Medicare Other | Admitting: *Deleted

## 2017-04-24 DIAGNOSIS — Z955 Presence of coronary angioplasty implant and graft: Secondary | ICD-10-CM

## 2017-04-24 DIAGNOSIS — Z48812 Encounter for surgical aftercare following surgery on the circulatory system: Secondary | ICD-10-CM | POA: Diagnosis not present

## 2017-04-24 NOTE — Progress Notes (Signed)
Daily Session Note  Patient Details  Name: ATLAS CROSSLAND MRN: [id number] Date of Birth: 01/06/1956 Referring Provider:     Cardiac Rehab from 04/15/2017 in [facility] Cardiac and Pulmonary Rehab  Referring Provider  Kathlyn Sacramento MD      Encounter Date: 04/24/2017  Check In:     Session Check In - 04/24/17 1715      Check-In   Staff Present Heath Lark, RN, BSN, CCRP;Meredith Sherryll Burger, RN Vickki Hearing, [place], ACSM CEP, Exercise Physiologist   Supervising physician immediately available to respond to emergencies See telemetry face sheet for immediately available ER MD   Medication changes reported     No   Fall or balance concerns reported    No   Warm-up and Cool-down Performed on first and last piece of equipment   Resistance Training Performed Yes   VAD Patient? No     Pain Assessment   Currently in Pain? No/denies         History  Smoking Status  . Former Smoker  . Packs/day: 1.50  . Years: 10.00  . Types: Cigarettes  . Quit date: 06/26/1991  Smokeless Tobacco  . Never Used    Comment: Quit 1993    Goals Met:  Exercise tolerated well No report of cardiac concerns or symptoms Strength training completed today  Goals Unmet:  Not Applicable  Comments: Doing well with exercise prescription progression.    Dr. Emily Filbert is Medical Director for [place] and [facility].

## 2017-04-25 ENCOUNTER — Encounter: Payer: Medicare Other | Attending: Cardiovascular Disease

## 2017-04-25 DIAGNOSIS — Z48812 Encounter for surgical aftercare following surgery on the circulatory system: Secondary | ICD-10-CM | POA: Insufficient documentation

## 2017-04-25 DIAGNOSIS — Z955 Presence of coronary angioplasty implant and graft: Secondary | ICD-10-CM | POA: Insufficient documentation

## 2017-04-25 NOTE — Progress Notes (Signed)
Daily Session Note  Patient Details  Name: Shawn Hood MRN: [id number] Date of Birth: Aug 09, 1955 Referring Provider:     Cardiac Rehab from 04/15/2017 in [facility] Cardiac and Pulmonary Rehab  Referring Provider  Kathlyn Sacramento MD      Encounter Date: 04/25/2017  Check In:     Session Check In - 04/25/17 1611      Check-In   Location [facility]-Cardiac & Pulmonary Rehab   Staff Present Gerlene Burdock, RN, Moises Blood, BS, ACSM CEP, Exercise Physiologist;Joseph Flavia Shipper   Supervising physician immediately available to respond to emergencies See telemetry face sheet for immediately available ER MD   Medication changes reported     No   Fall or balance concerns reported    No   Warm-up and Cool-down Performed on first and last piece of equipment   Resistance Training Performed Yes   VAD Patient? No     Pain Assessment   Currently in Pain? No/denies         History  Smoking Status  . Former Smoker  . Packs/day: 1.50  . Years: 10.00  . Types: Cigarettes  . Quit date: 06/26/1991  Smokeless Tobacco  . Never Used    Comment: Quit 1993    Goals Met:  Independence with exercise equipment Exercise tolerated well No report of cardiac concerns or symptoms Strength training completed today  Goals Unmet:  Not Applicable  Comments: Pt able to follow exercise prescription today without complaint.  Will continue to monitor for progression.   Dr. Emily Filbert is Medical Director for [place] and [facility].

## 2017-04-29 ENCOUNTER — Encounter: Payer: Medicare Other | Admitting: *Deleted

## 2017-04-29 DIAGNOSIS — Z48812 Encounter for surgical aftercare following surgery on the circulatory system: Secondary | ICD-10-CM | POA: Diagnosis not present

## 2017-04-29 DIAGNOSIS — Z955 Presence of coronary angioplasty implant and graft: Secondary | ICD-10-CM

## 2017-04-29 NOTE — Progress Notes (Signed)
Daily Session Note  Patient Details  Name: Shawn Hood MRN: [id number] Date of Birth: 10/19/1955 Referring Provider:     Cardiac Rehab from 04/15/2017 in [facility] Cardiac and Pulmonary Rehab  Referring Provider  Kathlyn Sacramento MD      Encounter Date: 04/29/2017  Check In: Session Check In - 04/29/17 1726      Check-In   Location  [facility]-Cardiac & Pulmonary Rehab    Staff Present  Renita Papa, RN Moises Blood, BS, ACSM CEP, Exercise Physiologist;Milik Gilreath Oletta Darter, [place], ACSM CEP, Exercise Physiologist    Supervising physician immediately available to respond to emergencies  See telemetry face sheet for immediately available ER MD    Medication changes reported      No    Fall or balance concerns reported     No    Warm-up and Cool-down  Performed on first and last piece of equipment    Resistance Training Performed  Yes    VAD Patient?  No      Pain Assessment   Currently in Pain?  No/denies        Exercise Prescription Changes - 04/29/17 1700      Response to Exercise   Blood Pressure (Admit)  118/66    Blood Pressure (Exercise)  132/64    Blood Pressure (Exit)  124/70    Heart Rate (Admit)  62 bpm    Heart Rate (Exercise)  91 bpm    Heart Rate (Exit)  68 bpm    Oxygen Saturation (Admit)  99 %    Oxygen Saturation (Exercise)  99 %    Rating of Perceived Exertion (Exercise)  11    Symptoms  none    Comments  walk test results      Resistance Training   Training Prescription  Yes    Weight  3 lbs    Reps  10-15      Treadmill   MPH  2.2    Grade  1.5    Minutes  15    METs  3.14      NuStep   Level  3    SPM  80    Minutes  15    METs  3      Recumbant Elliptical   Level  2    RPM  50    Minutes  15    METs  3      Home Exercise Plan   Plans to continue exercise at  Home (comment) walk   walk   Frequency  Add 2 additional days to program exercise sessions.    Initial Home Exercises Provided  04/29/17       Social History   Tobacco Use   Smoking Status Former Smoker  . Packs/day: 1.50  . Years: 10.00  . Pack years: 15.00  . Types: Cigarettes  . Last attempt to quit: 06/26/1991  . Years since quitting: 25.8  Smokeless Tobacco Never Used  Tobacco Comment   Quit 1993    Goals Met:  Independence with exercise equipment Exercise tolerated well No report of cardiac concerns or symptoms Strength training completed today  Goals Unmet:  Not Applicable  Comments: Reviewed home exercise with pt today.  Pt plans to walk for exercise.  Reviewed THR, pulse, RPE, sign and symptoms, NTG use, and when to call 911 or MD.  Also discussed weather considerations and indoor options.  Pt voiced understanding.    Dr. Emily Filbert is Medical Director for [place]  and [facility].

## 2017-05-01 ENCOUNTER — Encounter: Payer: Self-pay | Admitting: *Deleted

## 2017-05-01 ENCOUNTER — Encounter: Payer: Medicare Other | Admitting: *Deleted

## 2017-05-01 DIAGNOSIS — Z955 Presence of coronary angioplasty implant and graft: Secondary | ICD-10-CM

## 2017-05-01 DIAGNOSIS — Z48812 Encounter for surgical aftercare following surgery on the circulatory system: Secondary | ICD-10-CM | POA: Diagnosis not present

## 2017-05-01 NOTE — Progress Notes (Signed)
Daily Session Note  Patient Details  Name: Shawn Hood MRN: [id number] Date of Birth: July 24, 1955 Referring Provider:     Cardiac Rehab from 04/15/2017 in [facility] Cardiac and Pulmonary Rehab  Referring Provider  Kathlyn Sacramento MD      Encounter Date: 05/01/2017  Check In: Session Check In - 05/01/17 1650      Check-In   Location  [facility]-Cardiac & Pulmonary Rehab    Staff Present  Renita Papa, RN Vickki Hearing, BA, ACSM CEP, Exercise Physiologist;Carroll Enterkin, RN, BSN    Supervising physician immediately available to respond to emergencies  See telemetry face sheet for immediately available ER MD    Medication changes reported      No    Fall or balance concerns reported     No    Warm-up and Cool-down  Performed on first and last piece of equipment    Resistance Training Performed  Yes    VAD Patient?  No      Pain Assessment   Currently in Pain?  No/denies          Social History   Tobacco Use  Smoking Status Former Smoker  . Packs/day: 1.50  . Years: 10.00  . Pack years: 15.00  . Types: Cigarettes  . Last attempt to quit: 06/26/1991  . Years since quitting: 25.8  Smokeless Tobacco Never Used  Tobacco Comment   Quit 1993    Goals Met:  Independence with exercise equipment Exercise tolerated well No report of cardiac concerns or symptoms Strength training completed today  Goals Unmet:  Not Applicable  Comments: Pt able to follow exercise prescription today without complaint.  Will continue to monitor for progression.    Dr. Emily Filbert is Medical Director for [place] and [facility].
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
## 2017-05-02 DIAGNOSIS — Z955 Presence of coronary angioplasty implant and graft: Secondary | ICD-10-CM

## 2017-05-02 DIAGNOSIS — Z48812 Encounter for surgical aftercare following surgery on the circulatory system: Secondary | ICD-10-CM | POA: Diagnosis not present

## 2017-05-02 NOTE — Progress Notes (Signed)
Daily Session Note  Patient Details  Name: Shawn Hood MRN: [id number] Date of Birth: 09-20-1955 Referring Provider:     Cardiac Rehab from 04/15/2017 in [facility] Cardiac and Pulmonary Rehab  Referring Provider  Kathlyn Sacramento MD      Encounter Date: 05/02/2017  Check In: Session Check In - 05/02/17 1624      Check-In   Location  [facility]-Cardiac & Pulmonary Rehab    Staff Present  Justin Mend RCP,RRT,BSRT;Carroll Enterkin, RN, BSN    Supervising physician immediately available to respond to emergencies  See telemetry face sheet for immediately available ER MD    Medication changes reported      No    Fall or balance concerns reported     No    Warm-up and Cool-down  Performed on first and last piece of equipment    Resistance Training Performed  Yes    VAD Patient?  No      Pain Assessment   Currently in Pain?  No/denies          Social History   Tobacco Use  Smoking Status Former Smoker  . Packs/day: 1.50  . Years: 10.00  . Pack years: 15.00  . Types: Cigarettes  . Last attempt to quit: 06/26/1991  . Years since quitting: 25.8  Smokeless Tobacco Never Used  Tobacco Comment   Quit 1993    Goals Met:  Independence with exercise equipment Exercise tolerated well No report of cardiac concerns or symptoms Strength training completed today  Goals Unmet:  Not Applicable  Comments: Pt able to follow exercise prescription today without complaint.  Will continue to monitor for progression.   Dr. Emily Filbert is Medical Director for [place] and [facility].

## 2017-05-06 ENCOUNTER — Encounter: Payer: Medicare Other | Admitting: *Deleted

## 2017-05-06 DIAGNOSIS — Z48812 Encounter for surgical aftercare following surgery on the circulatory system: Secondary | ICD-10-CM | POA: Diagnosis not present

## 2017-05-06 DIAGNOSIS — Z955 Presence of coronary angioplasty implant and graft: Secondary | ICD-10-CM

## 2017-05-06 NOTE — Progress Notes (Signed)
Daily Session Note  Patient Details  Name: Shawn Hood MRN: [id number] Date of Birth: 02/24/1956 Referring Provider:     Cardiac Rehab from 04/15/2017 in [facility] Cardiac and Pulmonary Rehab  Referring Provider  Kathlyn Sacramento MD      Encounter Date: 05/06/2017  Check In: Session Check In - 05/06/17 1656      Check-In   Location  [facility]-Cardiac & Pulmonary Rehab    Staff Present  Nada Maclachlan, BA, ACSM CEP, Exercise Physiologist;Trelon Plush Amedeo Plenty, BS, ACSM CEP, Exercise Physiologist;Meredith Sherryll Burger, RN BSN    Supervising physician immediately available to respond to emergencies  See telemetry face sheet for immediately available ER MD    Medication changes reported      No    Fall or balance concerns reported     No    Warm-up and Cool-down  Performed on first and last piece of equipment    Resistance Training Performed  Yes    VAD Patient?  No      Pain Assessment   Currently in Pain?  No/denies    Multiple Pain Sites  No          Social History   Tobacco Use  Smoking Status Former Smoker  . Packs/day: 1.50  . Years: 10.00  . Pack years: 15.00  . Types: Cigarettes  . Last attempt to quit: 06/26/1991  . Years since quitting: 25.8  Smokeless Tobacco Never Used  Tobacco Comment   Quit 1993    Goals Met:  Independence with exercise equipment Exercise tolerated well No report of cardiac concerns or symptoms Strength training completed today  Goals Unmet:  Not Applicable  Comments: Pt able to follow exercise prescription today without complaint.  Will continue to monitor for progression.    Dr. Emily Filbert is Medical Director for [place] and [facility].

## 2017-05-08 ENCOUNTER — Encounter: Payer: Medicare Other | Admitting: *Deleted

## 2017-05-08 DIAGNOSIS — Z48812 Encounter for surgical aftercare following surgery on the circulatory system: Secondary | ICD-10-CM | POA: Diagnosis not present

## 2017-05-08 DIAGNOSIS — Z955 Presence of coronary angioplasty implant and graft: Secondary | ICD-10-CM

## 2017-05-08 NOTE — Progress Notes (Signed)
Daily Session Note  Patient Details  Name: PASTOR SGRO MRN: [id number] Date of Birth: Feb 20, 1956 Referring Provider:     Cardiac Rehab from 04/15/2017 in [facility] Cardiac and Pulmonary Rehab  Referring Provider  Kathlyn Sacramento MD      Encounter Date: 05/08/2017  Check In: Session Check In - 05/08/17 1623      Check-In   Location  [facility]-Cardiac & Pulmonary Rehab    Staff Present  Renita Papa, RN Vickki Hearing, BA, ACSM CEP, Exercise Physiologist;Carroll Enterkin, RN, BSN    Supervising physician immediately available to respond to emergencies  See telemetry face sheet for immediately available ER MD    Medication changes reported      No    Fall or balance concerns reported     No    Warm-up and Cool-down  Performed on first and last piece of equipment    Resistance Training Performed  Yes    VAD Patient?  No      Pain Assessment   Currently in Pain?  No/denies        Exercise Prescription Changes - 05/08/17 1100      Response to Exercise   Blood Pressure (Admit)  162/80    Blood Pressure (Exercise)  164/82    Blood Pressure (Exit)  130/80    Heart Rate (Admit)  83 bpm    Heart Rate (Exercise)  115 bpm    Heart Rate (Exit)  83 bpm    Rating of Perceived Exertion (Exercise)  11    Symptoms  none      Progression   Progression  Continue to progress workloads to maintain intensity without signs/symptoms of physical distress.    Average METs  3.71 arrived late - only did TM today      Horticulturist, commercial Prescription  Yes    Weight  7 lbs    Reps  10-15      Interval Training   Interval Training  No      Treadmill   MPH  2.6    Grade  2    Minutes  15    METs  3.71      Home Exercise Plan   Plans to continue exercise at  Home (comment) walk    Frequency  Add 2 additional days to program exercise sessions.    Initial Home Exercises Provided  04/29/17       Social History   Tobacco Use  Smoking Status Former Smoker  . Packs/day: 1.50   . Years: 10.00  . Pack years: 15.00  . Types: Cigarettes  . Last attempt to quit: 06/26/1991  . Years since quitting: 25.8  Smokeless Tobacco Never Used  Tobacco Comment   Quit 1993    Goals Met:  Independence with exercise equipment Exercise tolerated well No report of cardiac concerns or symptoms Strength training completed today  Goals Unmet:  Not Applicable  Comments: Pt able to follow exercise prescription today without complaint.  Will continue to monitor for progression.    Dr. Emily Filbert is Medical Director for [place] and [facility].

## 2017-05-09 ENCOUNTER — Encounter: Payer: Medicare Other | Admitting: *Deleted

## 2017-05-09 DIAGNOSIS — Z48812 Encounter for surgical aftercare following surgery on the circulatory system: Secondary | ICD-10-CM | POA: Diagnosis not present

## 2017-05-09 DIAGNOSIS — Z955 Presence of coronary angioplasty implant and graft: Secondary | ICD-10-CM

## 2017-05-09 NOTE — Progress Notes (Signed)
Daily Session Note  Patient Details  Name: Shawn Hood MRN: [id number] Date of Birth: [birth year] Referring Provider:     Cardiac Rehab from 04/15/2017 in [facility] Cardiac and Pulmonary Rehab  Referring Provider  Kathlyn Sacramento MD      Encounter Date: 05/09/2017  Check In: Session Check In - 05/09/17 1716      Check-In   Location  [facility]-Cardiac & Pulmonary Rehab    Staff Present  Earlean Shawl, BS, ACSM CEP, Exercise Physiologist;Carroll Enterkin, RN, BSN;Other    Supervising physician immediately available to respond to emergencies  See telemetry face sheet for immediately available ER MD    Medication changes reported      No    Fall or balance concerns reported     No    Warm-up and Cool-down  Performed on first and last piece of equipment    Resistance Training Performed  Yes    VAD Patient?  No      Pain Assessment   Currently in Pain?  No/denies    Multiple Pain Sites  No          Social History   Tobacco Use  Smoking Status Former Smoker  . Packs/day: 1.50  . Years: 10.00  . Pack years: 15.00  . Types: Cigarettes  . Last attempt to quit: 06/26/1991  . Years since quitting: 25.8  Smokeless Tobacco Never Used  Tobacco Comment   Quit 1993    Goals Met:  Independence with exercise equipment Exercise tolerated well No report of cardiac concerns or symptoms Strength training completed today  Goals Unmet:  Not Applicable  Comments: Pt able to follow exercise prescription today without complaint.  Will continue to monitor for progression.    Dr. Emily Filbert is Medical Director for [name] and [facility].

## 2017-05-10 ENCOUNTER — Other Ambulatory Visit
Admission: RE | Admit: 2017-05-10 | Discharge: 2017-05-10 | Disposition: A | Discharge status: Home / Self Care | Payer: Medicare Other | Source: Ambulatory Visit | Attending: Cardiovascular Disease | Admitting: Cardiovascular Disease

## 2017-05-10 DIAGNOSIS — E7800 Pure hypercholesterolemia, unspecified: Secondary | ICD-10-CM | POA: Insufficient documentation

## 2017-05-10 LAB — HEPATIC FUNCTION PANEL
ALT: 11 U/L — AB (ref 17–63)
AST: 18 U/L (ref 15–41)
Albumin: 4.1 g/dL (ref 3.5–5.0)
Alkaline Phosphatase: 87 U/L (ref 38–126)
Bilirubin, Direct: <0.1 mg/dL — ABNORMAL LOW (ref 0.1–0.5)
TOTAL PROTEIN: 8.4 g/dL — AB (ref 6.5–8.1)
Total Bilirubin: 1 mg/dL (ref 0.3–1.2)

## 2017-05-10 LAB — LIPID PANEL
CHOL/HDL RATIO: 5 ratio
CHOLESTEROL: 224 mg/dL — AB (ref 0–200)
HDL: 45 mg/dL (ref >40)
LDL Cholesterol: 172 mg/dL — ABNORMAL HIGH (ref 0–99)
Triglycerides: 33 mg/dL (ref <150)
VLDL: 7 mg/dL (ref 0–40)

## 2017-05-13 DIAGNOSIS — Z48812 Encounter for surgical aftercare following surgery on the circulatory system: Secondary | ICD-10-CM | POA: Diagnosis not present

## 2017-05-13 DIAGNOSIS — Z955 Presence of coronary angioplasty implant and graft: Secondary | ICD-10-CM

## 2017-05-13 NOTE — Progress Notes (Signed)
Daily Session Note  Patient Details  Name: Shawn Hood MRN: [id number] Date of Birth: May 06, 1956 Referring Provider:     Cardiac Rehab from 04/15/2017 in [facility] Cardiac and Pulmonary Rehab  Referring Provider  Kathlyn Sacramento MD      Encounter Date: 05/13/2017  Check In: Session Check In - 05/13/17 1659      Check-In   Location  [facility]-Cardiac & Pulmonary Rehab    Staff Present  Earlean Shawl, BS, ACSM CEP, Exercise Physiologist;Amanda Oletta Darter, BA, ACSM CEP, Exercise Physiologist;Meredith Sherryll Burger, RN BSN    Supervising physician immediately available to respond to emergencies  See telemetry face sheet for immediately available ER MD    Medication changes reported      No    Fall or balance concerns reported     No    Warm-up and Cool-down  Performed on first and last piece of equipment    Resistance Training Performed  Yes    VAD Patient?  No      Pain Assessment   Currently in Pain?  No/denies    Multiple Pain Sites  No          Social History   Tobacco Use  Smoking Status Former Smoker  . Packs/day: 1.50  . Years: 10.00  . Pack years: 15.00  . Types: Cigarettes  . Last attempt to quit: 06/26/1991  . Years since quitting: 25.8  Smokeless Tobacco Never Used  Tobacco Comment   Quit 1993    Goals Met:  Independence with exercise equipment Exercise tolerated well No report of cardiac concerns or symptoms Strength training completed today  Goals Unmet:  Not Applicable  Comments: Pt able to follow exercise prescription today without complaint.  Will continue to monitor for progression.    Dr. Emily Filbert is Medical Director for [place] and [facility].

## 2017-05-15 ENCOUNTER — Other Ambulatory Visit: Payer: Self-pay

## 2017-05-15 ENCOUNTER — Encounter: Payer: Medicare Other | Admitting: *Deleted

## 2017-05-15 DIAGNOSIS — Z955 Presence of coronary angioplasty implant and graft: Secondary | ICD-10-CM

## 2017-05-15 DIAGNOSIS — Z48812 Encounter for surgical aftercare following surgery on the circulatory system: Secondary | ICD-10-CM | POA: Diagnosis not present

## 2017-05-15 DIAGNOSIS — E7800 Pure hypercholesterolemia, unspecified: Secondary | ICD-10-CM

## 2017-05-15 NOTE — Progress Notes (Signed)
Daily Session Note  Patient Details  Name: GRAEDEN BITNER MRN: [id number] Date of Birth: [birth year] Referring Provider:     Cardiac Rehab from 04/15/2017 in [facility] Cardiac and Pulmonary Rehab  Referring Provider  Kathlyn Sacramento MD      Encounter Date: 05/15/2017  Check In: Session Check In - 05/15/17 1623      Check-In   Location  [facility]-Cardiac & Pulmonary Rehab    Staff Present  Renita Papa, RN Vickki Hearing, BA, ACSM CEP, Exercise Physiologist;Carroll Enterkin, RN, BSN    Supervising physician immediately available to respond to emergencies  See telemetry face sheet for immediately available ER MD    Medication changes reported      No    Fall or balance concerns reported     No    Warm-up and Cool-down  Performed on first and last piece of equipment    Resistance Training Performed  Yes    VAD Patient?  No      Pain Assessment   Currently in Pain?  No/denies          Social History   Tobacco Use  Smoking Status Former Smoker  . Packs/day: 1.50  . Years: 10.00  . Pack years: 15.00  . Types: Cigarettes  . Last attempt to quit: 06/26/1991  . Years since quitting: 25.9  Smokeless Tobacco Never Used  Tobacco Comment   Quit 1993    Goals Met:  Independence with exercise equipment Exercise tolerated well No report of cardiac concerns or symptoms Strength training completed today  Goals Unmet:  Not Applicable  Comments: Pt able to follow exercise prescription today without complaint.  Will continue to monitor for progression.    Dr. Emily Filbert is Medical Director for [place] and [facility].

## 2017-05-15 NOTE — Progress Notes (Unsigned)
Fe 

## 2017-05-20 DIAGNOSIS — Z48812 Encounter for surgical aftercare following surgery on the circulatory system: Secondary | ICD-10-CM | POA: Diagnosis not present

## 2017-05-20 DIAGNOSIS — Z955 Presence of coronary angioplasty implant and graft: Secondary | ICD-10-CM

## 2017-05-20 NOTE — Progress Notes (Signed)
Daily Session Note  Patient Details  Name: MENG WINTERTON MRN: [id number] Date of Birth: [birth year] Referring Provider:     Cardiac Rehab from 04/15/2017 in [facility] Cardiac and Pulmonary Rehab  Referring Provider  Kathlyn Sacramento MD      Encounter Date: 05/20/2017  Check In: Session Check In - 05/20/17 1634      Check-In   Location  [facility]-Cardiac & Pulmonary Rehab    Staff Present  Renita Papa, RN Moises Blood, BS, ACSM CEP, Exercise Physiologist;Bernhardt Riemenschneider Oletta Darter, [place], ACSM CEP, Exercise Physiologist    Supervising physician immediately available to respond to emergencies  See telemetry face sheet for immediately available ER MD    Medication changes reported      No    Fall or balance concerns reported     No    Warm-up and Cool-down  Performed on first and last piece of equipment    Resistance Training Performed  Yes    VAD Patient?  No      Pain Assessment   Currently in Pain?  No/denies          Social History   Tobacco Use  Smoking Status Former Smoker  . Packs/day: 1.50  . Years: 10.00  . Pack years: 15.00  . Types: Cigarettes  . Last attempt to quit: 06/26/1991  . Years since quitting: 25.9  Smokeless Tobacco Never Used  Tobacco Comment   Quit 1993    Goals Met:  Independence with exercise equipment Exercise tolerated well No report of cardiac concerns or symptoms Strength training completed today  Goals Unmet:  Not Applicable  Comments: Pt able to follow exercise prescription today without complaint.  Will continue to monitor for progression.    Dr. Emily Filbert is Medical Director for [place] and [facility].

## 2017-05-22 ENCOUNTER — Encounter: Payer: Medicare Other | Admitting: *Deleted

## 2017-05-22 DIAGNOSIS — Z48812 Encounter for surgical aftercare following surgery on the circulatory system: Secondary | ICD-10-CM | POA: Diagnosis not present

## 2017-05-22 DIAGNOSIS — Z955 Presence of coronary angioplasty implant and graft: Secondary | ICD-10-CM

## 2017-05-22 NOTE — Progress Notes (Signed)
Daily Session Note  Patient Details  Name: Shawn Hood MRN: [id number] Date of Birth: 1956/01/09 Referring Provider:     Cardiac Rehab from 04/15/2017 in [facility] Cardiac and Pulmonary Rehab  Referring Provider  Kathlyn Sacramento MD      Encounter Date: 05/22/2017  Check In: Session Check In - 05/22/17 1647      Check-In   Location  [facility]-Cardiac & Pulmonary Rehab    Staff Present  Renita Papa, RN Vickki Hearing, BA, ACSM CEP, Exercise Physiologist;Carroll Enterkin, RN, BSN    Supervising physician immediately available to respond to emergencies  See telemetry face sheet for immediately available ER MD    Medication changes reported      No    Fall or balance concerns reported     No    Tobacco Cessation  No Change    Warm-up and Cool-down  Performed on first and last piece of equipment    Resistance Training Performed  Yes    VAD Patient?  No      Pain Assessment   Currently in Pain?  No/denies        Exercise Prescription Changes - 05/22/17 1500      Response to Exercise   Blood Pressure (Admit)  138/84    Blood Pressure (Exercise)  162/92    Blood Pressure (Exit)  122/62    Heart Rate (Admit)  78 bpm    Heart Rate (Exercise)  129 bpm    Heart Rate (Exit)  69 bpm    Rating of Perceived Exertion (Exercise)  14    Symptoms  none      Progression   Progression  Continue to progress workloads to maintain intensity without signs/symptoms of physical distress.    Average METs  4.05      Resistance Training   Training Prescription  Yes    Weight  7 lbs    Reps  10-15      Interval Training   Interval Training  No      Treadmill   MPH  2.6    Grade  1.5    Minutes  15    METs  3.53      Recumbant Elliptical   Level  6    RPM  50    Minutes  15    METs  4.6      Home Exercise Plan   Plans to continue exercise at  Home (comment) walk    Frequency  Add 2 additional days to program exercise sessions.    Initial Home Exercises Provided  04/29/17        Social History   Tobacco Use  Smoking Status Former Smoker  . Packs/day: 1.50  . Years: 10.00  . Pack years: 15.00  . Types: Cigarettes  . Last attempt to quit: 06/26/1991  . Years since quitting: 25.9  Smokeless Tobacco Never Used  Tobacco Comment   Quit 1993    Goals Met:  Independence with exercise equipment Exercise tolerated well No report of cardiac concerns or symptoms Strength training completed today  Goals Unmet:  Not Applicable  Comments: Pt able to follow exercise prescription today without complaint.  Will continue to monitor for progression.    Dr. Emily Filbert is Medical Director for [place] and [facility].

## 2017-05-23 ENCOUNTER — Encounter: Payer: Medicare Other | Admitting: *Deleted

## 2017-05-23 DIAGNOSIS — Z955 Presence of coronary angioplasty implant and graft: Secondary | ICD-10-CM

## 2017-05-23 DIAGNOSIS — Z48812 Encounter for surgical aftercare following surgery on the circulatory system: Secondary | ICD-10-CM | POA: Diagnosis not present

## 2017-05-23 NOTE — Progress Notes (Signed)
Daily Session Note  Patient Details  Name: Shawn Hood MRN: [id number] Date of Birth: 1955-08-05 Referring Provider:     Cardiac Rehab from 04/15/2017 in [facility] Cardiac [facility]monary Rehab  Referring Provider  Kathlyn Sacramento MD      Encounter Date: 05/23/2017  Check In: Session Check In - 05/23/17 1641      Check-In   Location  [facility]-Cardiac & Pulmonary Rehab    Staff Present  Earlean Shawl, BS, ACSM CEP, Exercise Physiologist;Joseph Tessie Fass RCP,RRT,BSRT;Carroll Enterkin, RN, BSN    Supervising physician immediately available to respond to emergencies  See telemetry face sheet for immediately available ER MD    Medication changes reported      No    Fall or balance concerns reported     No    Tobacco Cessation  No Change    Warm-up and Cool-down  Performed on first and last piece of equipment    Resistance Training Performed  Yes    VAD Patient?  No      Pain Assessment   Currently in Pain?  No/denies    Multiple Pain Sites  No          Social History   Tobacco Use  Smoking Status Former Smoker  . Packs/day: 1.50  . Years: 10.00  . Pack years: 15.00  . Types: Cigarettes  . Last attempt to quit: 06/26/1991  . Years since quitting: 25.9  Smokeless Tobacco Never Used  Tobacco Comment   Quit 1993    Goals Met:  Independence with exercise equipment Exercise tolerated well No report of cardiac concerns or symptoms Strength training completed today  Goals Unmet:  Not Applicable  Comments: Pt able to follow exercise prescription today without complaint.  Will continue to monitor for progression.    Dr. Emily Filbert is Medical Director for [facility].

## 2017-05-27 ENCOUNTER — Encounter: Payer: Medicare Other | Attending: Cardiovascular Disease

## 2017-05-27 DIAGNOSIS — Z955 Presence of coronary angioplasty implant and graft: Secondary | ICD-10-CM | POA: Diagnosis present

## 2017-05-27 DIAGNOSIS — Z48812 Encounter for surgical aftercare following surgery on the circulatory system: Secondary | ICD-10-CM | POA: Insufficient documentation

## 2017-05-27 NOTE — Progress Notes (Signed)
Daily Session Note  Patient Details  Name: Shawn Hood MRN: [id number] Date of Birth: March 08, 1956 Referring Provider:     Cardiac Rehab from 04/15/2017 in [facility] Cardiac and Pulmonary Rehab  Referring Provider  Kathlyn Sacramento MD      Encounter Date: 05/27/2017  Check In: Session Check In - 05/27/17 1652      Check-In   Location  [facility]Cardiac [facility] Pul[facility]    Staff Present  Earlean Shawl, BS, ACSM CEP, Exercise Physiologist;Abass Misener Oletta Darter, BA, ACSM CEP, Exercise Physiologist;Carroll Enterkin, RN, BSN    Supervising physician immediately available to respond to emergencies  See telemetry face sheet for immediately available ER MD    Medication changes reported      No    Fall or balance concerns reported     No    Warm-up and Cool-down  Performed on first and last piece of equipment    Resistance Training Performed  Yes    VAD Patient?  No      Pain Assessment   Currently in Pain?  No/denies    Multiple Pain Sites  No          Social History   Tobacco Use  Smoking Status Former Smoker  . Packs/day: 1.50  . Years: 10.00  . Pack years: 15.00  . Types: Cigarettes  . Last attempt to quit: 06/26/1991  . Years since quitting: 25.9  Smokeless Tobacco Never Used  Tobacco Comment   Quit 1993    Goals Met:  Independence with exercise equipment Exercise tolerated well No report of cardiac concerns or symptoms Strength training completed today  Goals Unmet:  Not Applicable  Comments: Pt able to follow exercise prescription today without complaint.  Will continue to monitor for progression.    Dr. Emily Filbert is Medical Director for [facility].

## 2017-05-28 ENCOUNTER — Emergency Department
Admission: EM | Admit: 2017-05-28 | Discharge: 2017-06-25 | Disposition: E | Payer: Medicare Other | Attending: Emergency Medicine | Admitting: Emergency Medicine

## 2017-05-28 DIAGNOSIS — Z7982 Long term (current) use of aspirin: Secondary | ICD-10-CM | POA: Diagnosis not present

## 2017-05-28 DIAGNOSIS — Z87891 Personal history of nicotine dependence: Secondary | ICD-10-CM | POA: Diagnosis not present

## 2017-05-28 DIAGNOSIS — I259 Chronic ischemic heart disease, unspecified: Secondary | ICD-10-CM | POA: Diagnosis not present

## 2017-05-28 DIAGNOSIS — Z794 Long term (current) use of insulin: Secondary | ICD-10-CM | POA: Diagnosis not present

## 2017-05-28 DIAGNOSIS — Z7902 Long term (current) use of antithrombotics/antiplatelets: Secondary | ICD-10-CM | POA: Diagnosis not present

## 2017-05-28 DIAGNOSIS — Z955 Presence of coronary angioplasty implant and graft: Secondary | ICD-10-CM | POA: Insufficient documentation

## 2017-05-28 DIAGNOSIS — Z79899 Other long term (current) drug therapy: Secondary | ICD-10-CM | POA: Diagnosis not present

## 2017-05-28 DIAGNOSIS — I252 Old myocardial infarction: Secondary | ICD-10-CM | POA: Diagnosis not present

## 2017-05-28 DIAGNOSIS — I469 Cardiac arrest, cause unspecified: Secondary | ICD-10-CM

## 2017-05-28 DIAGNOSIS — I10 Essential (primary) hypertension: Secondary | ICD-10-CM | POA: Insufficient documentation

## 2017-05-28 DIAGNOSIS — E119 Type 2 diabetes mellitus without complications: Secondary | ICD-10-CM | POA: Diagnosis not present

## 2017-05-28 MED ORDER — EPINEPHRINE PF 1 MG/10ML IJ SOSY
PREFILLED_SYRINGE | INTRAMUSCULAR | Status: AC | PRN
  Administered 2017-05-28 (×2): 1 mg via INTRAVENOUS

## 2017-05-28 MED ORDER — SODIUM CHLORIDE 0.9 % IV SOLN
INTRAVENOUS | Status: AC | PRN
  Administered 2017-05-28: 1000 mL via INTRAVENOUS

## 2017-05-28 MED ORDER — MAGNESIUM SULFATE 50 % IJ SOLN
INTRAMUSCULAR | Status: AC | PRN
  Administered 2017-05-28: 2 g via INTRAVENOUS

## 2017-05-28 MED ORDER — SODIUM BICARBONATE 8.4 % IV SOLN
INTRAVENOUS | Status: AC | PRN
  Administered 2017-05-28: 50 meq via INTRAVENOUS

## 2017-05-28 MED FILL — Medication: Qty: 1 | Status: AC

## 2017-05-29 ENCOUNTER — Encounter: Payer: Self-pay | Admitting: *Deleted

## 2017-06-25 NOTE — Progress Notes (Signed)
Upon arrival [facility] Butts[facility]ade was being administered CPR. Very large family present. Chaplain offered listening presence, emotional support. Local pastor present who reports [employer] pastor enroute. Chaplain supported family at time of death and assisted family in viewing [facility].     April 06, 2017 0400  Clinical Encounter Type  Visited With Patient and family together;Health care provider  Visit Type Initial;Critical Care;ED;Death  Referral From Nurse  Spiritual Encounters  Spiritual Needs Emotional;Grief support  Stress Factors  Patient Stress Factors Health changes  Family Stress Factors Major life changes;Loss of control

## 2017-06-25 NOTE — ED Notes (Signed)
Time of Death called by Dr. Chiquita LothJade Sung [phone]

## 2017-06-25 NOTE — ED Notes (Signed)
[facility]

## 2017-06-25 NOTE — ED Notes (Signed)
Family at bedside. Chaplain @ BS

## 2017-06-25 NOTE — ED Provider Notes (Signed)
[facility] Emergency Department Provider Note   ____________________________________________   First MD Initiated Contact with Patient 12/06/2016 [phone]     (approximate)  I have reviewed the triage vital signs and the nursing notes.   HISTORY  Chief Complaint Cardiac arrest  Level 5 caveat: CPR in progress  HPI Shawn Hood is a 62 y.o. male brought to the ED from home via EMS CPR in progress.  Patient with multiple medical issues including CAD status post CABG, diabetes, hypertension, hyperlipidemia who awoke during the night and told his wife he felt unwell.  He thought his sugar was low and in the process of checking his sugar he had a witnessed arrest.  EMS reports a total downtime of over 1 hour including resuscitation time.  Patient was defibrillated x 5 for an initial ventricular fibrillation rhythm.  Subsequently he became asystolic.  Patient received 7 epi, 450 mg IV amiodarone prior to arrival to the ED.  Patient arrives to the ED with a King airway in place, [facility] performing active CPR, pulseless and being bagged.   Past Medical History:  Diagnosis Date  . CAD (coronary artery disease)    Non-ST elevation myocardial infarction in October 2018.  Cardiac catheterization showed patent LIMA to LAD, occluded SVG to OM and severe disease and SVG to right PDA.  He underwent successful angioplasty and drug-eluting stent placement to the proximal right PDA.  . Carotid arterial disease (HCC)    a. 03/2013 s/p L CEA;  b. 06/2015 Carotid U/S: RICA 1-39%, LICA patent.  . Coronary artery disease    a. 2002 s/p CABG x 3 (LIMA->LAD, VG->OM3, VG->AM;  b. 09/2010 NSTEMI/Cath: no culprits->Med Rx.  c. 02/2012 Cath/PCI: LM min irregs, LAD 12070m, LCX 100, RCA 40p, 6949m, AM 99, LIMA->LAD ok w/ sev diff apical LAD dzs (stable), VG->OM3 99 (3.0x12 Promus DES), VG->AM 25p, 60p.  . CVA (cerebral infarction)    a. on MRI 09/2010 - chronic infarct in the pons.  .  Diabetes mellitus type 2, uncontrolled, with complications (HCC) DX: Age 62  . Hyperlipidemia   . Hypertension   . Myocardial infarction (HCC) 2002  . Sinus bradycardia   . Stroke [facility](HCC)    a. ? 2006;  b. on MRI 09/2010 - chronic infarct in the pons.    Patient Active Problem List   Diagnosis Date Noted  . Unstable angina (HCC) 04/06/2017  . Occlusion and stenosis of carotid artery without mention of cerebral infarction 04/21/2013  . TIA (transient ischemic attack) 03/31/2013  . Bradycardia 03/29/2013  . Diabetes mellitus type 2, uncontrolled, with complications (HCC)   . Hyperlipidemia 02/27/2012  . CAD (coronary artery disease) of artery bypass graft 02/27/2012  . H/O: CVA (cerebrovascular accident) 02/27/2012  . Chest pain 10/03/2011  . HTN (hypertension) 10/03/2011    Past Surgical History:  Procedure Laterality Date  . CHOLECYSTECTOMY    . CORONARY ANGIOPLASTY WITH STENT PLACEMENT  2013  . CORONARY ARTERY BYPASS GRAFT  2002  . CORONARY STENT INTERVENTION N/A 04/08/2017   Procedure: CORONARY STENT INTERVENTION;  Surgeon: Iran OuchArida, Muhammad A, MD;  Location: [facility] INVASIVE CV LAB;  Service: Cardiovascular;  Laterality: N/A;  . ENDARTERECTOMY Left 03/30/2013   Procedure: ENDARTERECTOMY CAROTID;  Surgeon: Larina Earthlyodd F Early, MD;  [facility];  Service: Vascular;  Laterality: Left;  . heart bypass    . INTRAVASCULAR PRESSURE WIRE/FFR STUDY N/A 04/08/2017   Procedure: INTRAVASCULAR PRESSURE WIRE/FFR STUDY;  Surgeon: Iran OuchArida, Muhammad A, MD;  Location: [facility] INVASIVE CV LAB;  Service: Cardiovascular;  Laterality: N/A;  . LEFT HEART CATH AND CORONARY ANGIOGRAPHY N/A 04/08/2017   Procedure: LEFT HEART CATH AND CORONARY ANGIOGRAPHY;  Surgeon: Iran Ouch, MD;  Location: [facility] INVASIVE CV LAB;  Service: Cardiovascular;  Laterality: N/A;  . LEFT HEART CATHETERIZATION WITH CORONARY/GRAFT ANGIOGRAM N/A 02/29/2012   Procedure: LEFT HEART CATHETERIZATION WITH Isabel Caprice;  Surgeon:  [name], MD;  Location: [facility] CATH LAB;  Service: Cardiovascular;  Laterality: N/A;  . PERCUTANEOUS CORONARY STENT INTERVENTION (PCI-S)  02/29/2012   Procedure: PERCUTANEOUS CORONARY STENT INTERVENTION (PCI-S);  Surgeon: [name], MD;  Location: [facility] CATH LAB;  Service: Cardiovascular;;  . SHOULDER SURGERY Left     Prior to Admission medications   Medication Sig Start Date End Date Taking? Authorizing Provider  aspirin 81 MG tablet Take 1 tablet (81 mg total) by mouth daily. 03/31/13   Tyrone Nine, MD  atorvastatin (LIPITOR) 80 MG tablet Take 80 mg by mouth at bedtime.    [provider]  clopidogrel (PLAVIX) 75 MG tablet Take 75 mg by mouth daily.    [provider]  cyclobenzaprine (FLEXERIL) 5 MG tablet Take 1 tablet (5 mg total) by mouth 3 (three) times daily as needed. 02/19/17   Little, Traci M, PA-C  diphenhydrAMINE (BENADRYL) 25 mg capsule Take 1 capsule (25 mg total) by mouth every 6 (six) hours as needed for itching. 04/18/13   Derwood Kaplan, MD  HUMALOG MIX 75/25 KWIKPEN (75-25) 100 UNIT/ML Kwikpen 50 Units 2 (two) times daily.  02/26/17   [provider]  isosorbide mononitrate (IMDUR) 30 MG 24 hr tablet Take 1 tablet (30 mg total) by mouth daily. 04/10/17   Adrian Saran, MD  losartan (COZAAR) 25 MG tablet Take 1 tablet by mouth daily. 01/04/17   [provider]  metFORMIN (GLUCOPHAGE) 500 MG tablet Take 1,000 mg by mouth 2 (two) times daily with a meal.     [provider]  nitroGLYCERIN (NITROSTAT) 0.4 MG SL tablet Place 1 tablet (0.4 mg total) under the tongue every 5 (five) minutes as needed for chest pain. 04/09/17   Adrian Saran, MD  pantoprazole (PROTONIX) 40 MG tablet Take 1 tablet (40 mg total) by mouth daily. 07/04/15   Deneise Lever, MD  traMADol (ULTRAM) 50 MG tablet Take 1 tablet (50 mg total) by mouth every 6 (six) hours as needed. 02/19/17 02/19/18  Little, Traci M, PA-C    Allergies Accupril [quinapril hcl]  Family  History  Problem Relation Age of Onset  . Coronary artery disease Mother        Died at 5 of MI  . Diabetes Mother   . Hypertension Mother   . Heart disease Mother   . Coronary artery disease Brother        Died of CAD but had other medical problems  . Heart disease Brother   . Diabetes Sister   . Diabetes Brother   . Hypertension Brother     Social History Social History   Tobacco Use  . Smoking status: Former Smoker    Packs/day: 1.50    Years: 10.00    Pack years: 15.00    Types: Cigarettes    Last attempt to quit: 06/26/1991    Years since quitting: 25.9  . Smokeless tobacco: Never Used  . Tobacco comment: Quit 1993  Substance Use Topics  . Alcohol use: No  . Drug use: No    Review of Systems  Constitutional: Positive  for "not feeling well".  No fever/chills. Eyes: No visual changes. ENT: No sore throat. Cardiovascular: Denies chest pain. Respiratory: Denies shortness of breath. Gastrointestinal: No abdominal pain.  No nausea, no vomiting.  No diarrhea.  No constipation. Genitourinary: Negative for dysuria. Musculoskeletal: Negative for back pain. Skin: Negative for rash. Neurological: Negative for headaches, focal weakness or numbness.  10 point review of systems limited by cardiac arrest ____________________________________________   PHYSICAL EXAM:  VITAL SIGNS: ED Triage Vitals  Enc Vitals Group     BP      Pulse      Resp      Temp      Temp src      SpO2      Weight      Height      Head Circumference      Peak Flow      Pain Score      Pain Loc      Pain Edu?      Excl. in GC?     Constitutional: Unresponsive.   Eyes: Pupils fixed and dilated.   Head: Atraumatic. Nose: No external deformity. Mouth/Throat: King airway in place.  Neck: No external evidence of injury. Cardiovascular: Active compressions by [facility] device.   Respiratory: No respiratory effort.  Being bagged.   Gastrointestinal: Soft, nondistended.  Musculoskeletal: No  external evidence of injury. Neurologic: Unresponsive.   Skin:  Skin is cool, dry and intact. No rash noted. Psychiatric: Unable to assess. ____________________________________________   LABS (all labs ordered are listed, but only abnormal results are displayed)  Labs Reviewed - No data to display ____________________________________________  EKG  None ____________________________________________  RADIOLOGY  No results found.  ____________________________________________   PROCEDURES  Procedure(s) performed:     Procedure Name: Intubation Date/Time: 2017/06/17 2:52 AM Performed by: Irean Hong, MD Pre-anesthesia Checklist: Patient identified, Emergency Drugs available, Suction available and Patient being monitored Oxygen Delivery Method: Ambu bag Preoxygenation: Pre-oxygenation with 100% oxygen Induction Type: IV induction and Rapid sequence Ventilation: Mask ventilation without difficulty Laryngoscope Size: Glidescope and 3 Grade View: Grade I Tube size: 7.5 mm Number of attempts: 1 Airway Equipment and Method: Rigid stylet Placement Confirmation: ETT inserted through vocal cords under direct vision,  CO2 detector,  Breath sounds checked- equal and bilateral and Positive ETCO2 Dental Injury: Teeth and Oropharynx as per pre-operative assessment         Critical Care performed: Yes, see critical care note(s)   CRITICAL CARE Performed by: Irean Hong   Total critical care time: 30 minutes  Critical care time was exclusive of separately billable procedures and treating other patients.  Critical care was necessary to treat or prevent imminent or life-threatening deterioration.  Critical care was time spent personally by me on the following activities: development of treatment plan with patient and/or surrogate as well as nursing, discussions with consultants, evaluation of patient's response to treatment, examination of patient, obtaining history from  patient or surrogate, ordering and performing treatments and interventions, ordering and review of laboratory studies, ordering and review of radiographic studies, pulse oximetry and re-evaluation of patient's condition.  ____________________________________________   INITIAL IMPRESSION / ASSESSMENT AND PLAN / ED COURSE  As part of my medical decision making, I reviewed the following data within the electronic MEDICAL RECORD NUMBER History obtained from family, Nursing notes reviewed and incorporated and Notes from prior ED visits.   62 year old male with multiple medical issues feeling unwell and had a witnessed arrest.  Downtime including resuscitation  time over 1 hour prior to patient's arrival to the ED.  Patient was intubated with endotracheal tube, sodium bicarbonate,  2g magnesium and 2 additional rounds of epi administered without return of spontaneous circulation.  Bedside ultrasound did not reveal cardiac activity.  Wife, son and family member were at the bedside.  Time of death [id number].      ____________________________________________   FINAL CLINICAL IMPRESSION(S) / ED DIAGNOSES  Final diagnoses:  Cardiac arrest [facility](HCC)     ED Discharge Orders    None       Note:  This document was prepared using Dragon voice recognition software and may include unintentional dictation errors.    Irean HongSung, Savvy Peeters J, MD October 05, 2016 [phone]
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
## 2017-06-25 DEATH — deceased

## 2017-07-19 ENCOUNTER — Ambulatory Visit: Payer: Medicare Other | Admitting: Cardiovascular Disease

## 2019-06-06 IMAGING — CR DG CHEST 2V
2 series · 3 of 3 positions shown · non-contrast
Comparison: 03/26/2013

CLINICAL DATA: Chest pain radiating into the left arm, onset
tonight.

EXAM:
CHEST  2 VIEW

[chest pa]
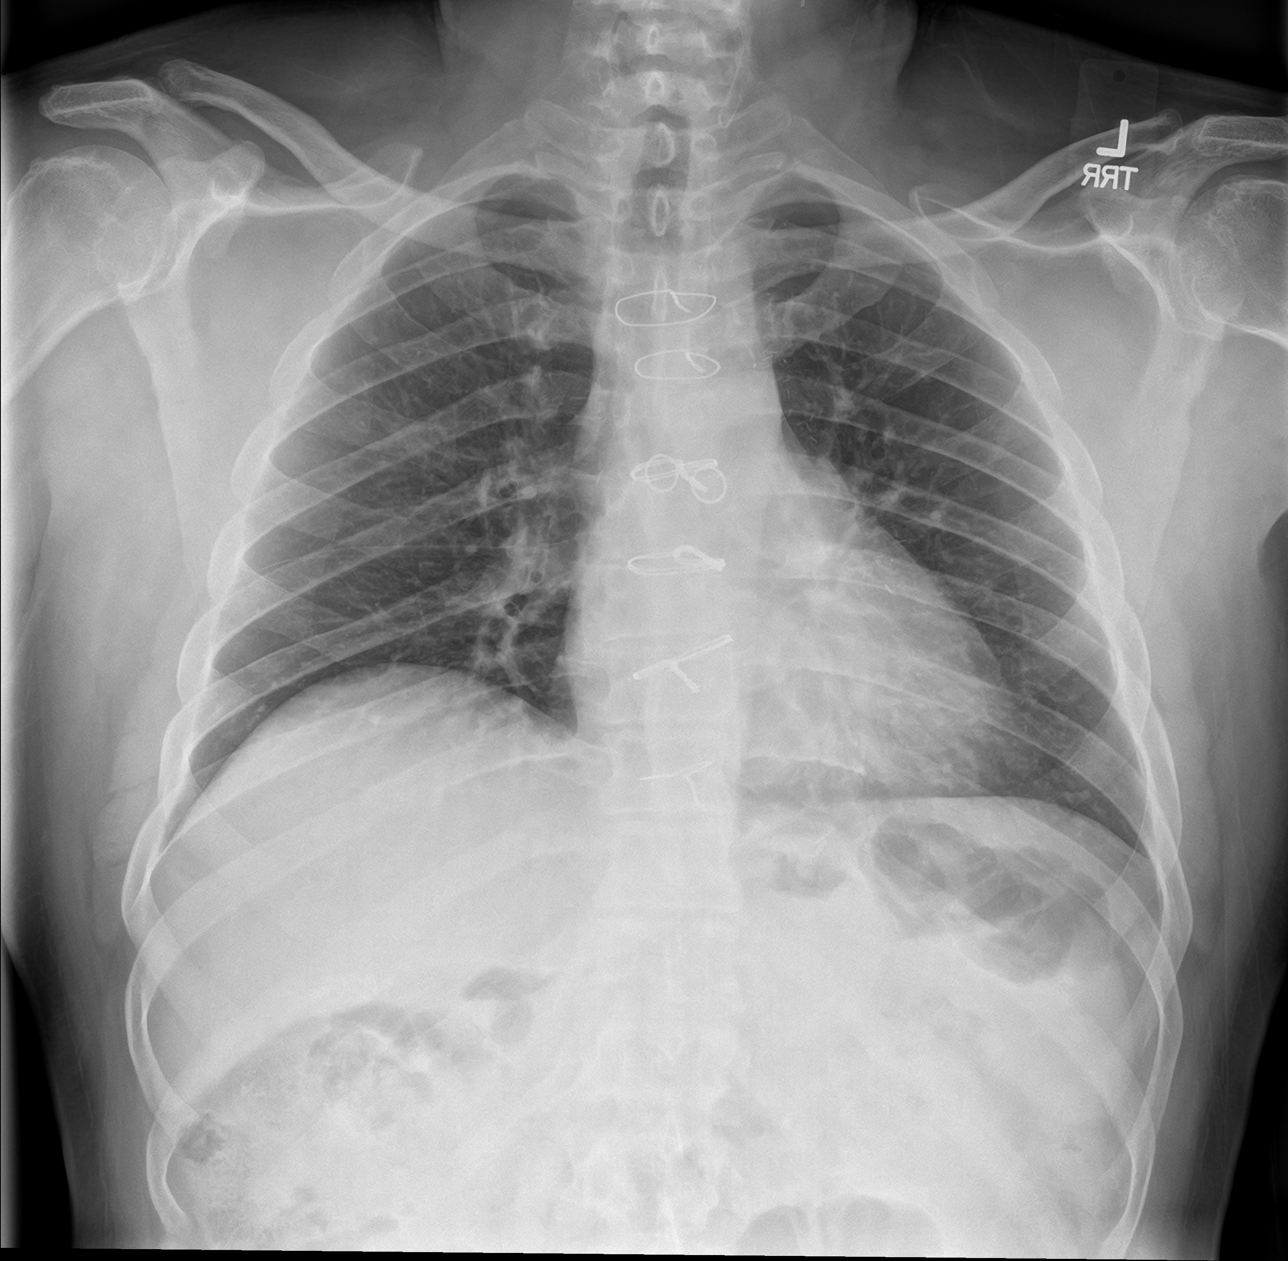

[Series 2: chest lat · 0.14mm/px · 2 of 2 slices shown]
[im 1/2]
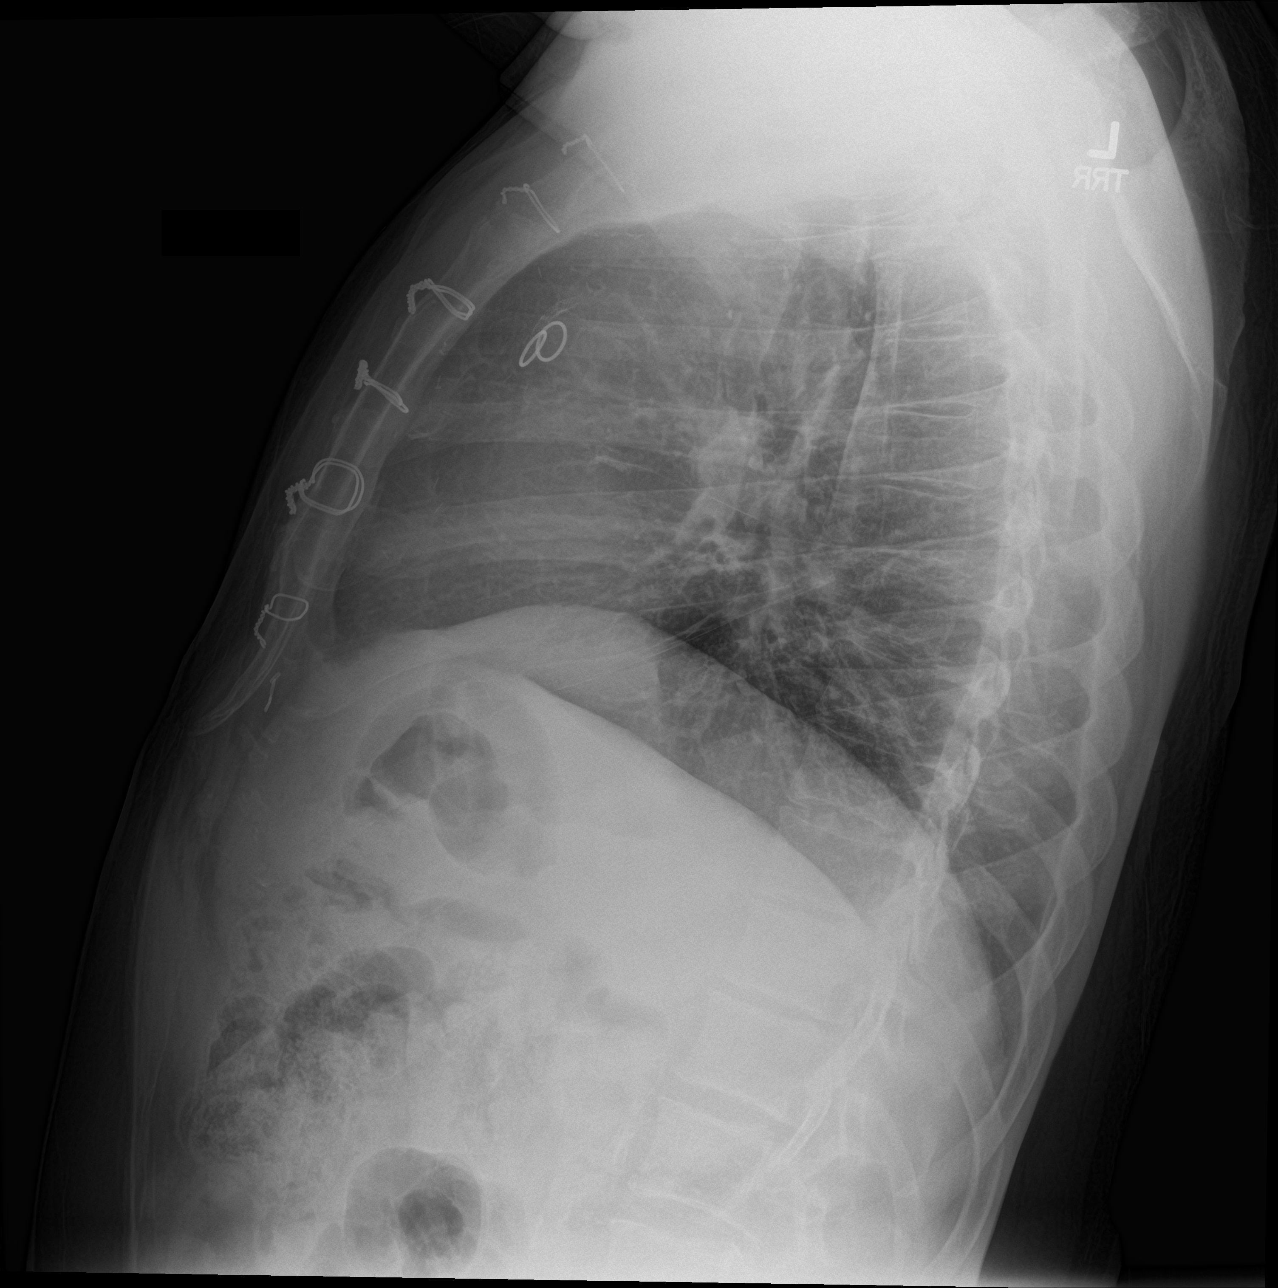
[im 2/2]
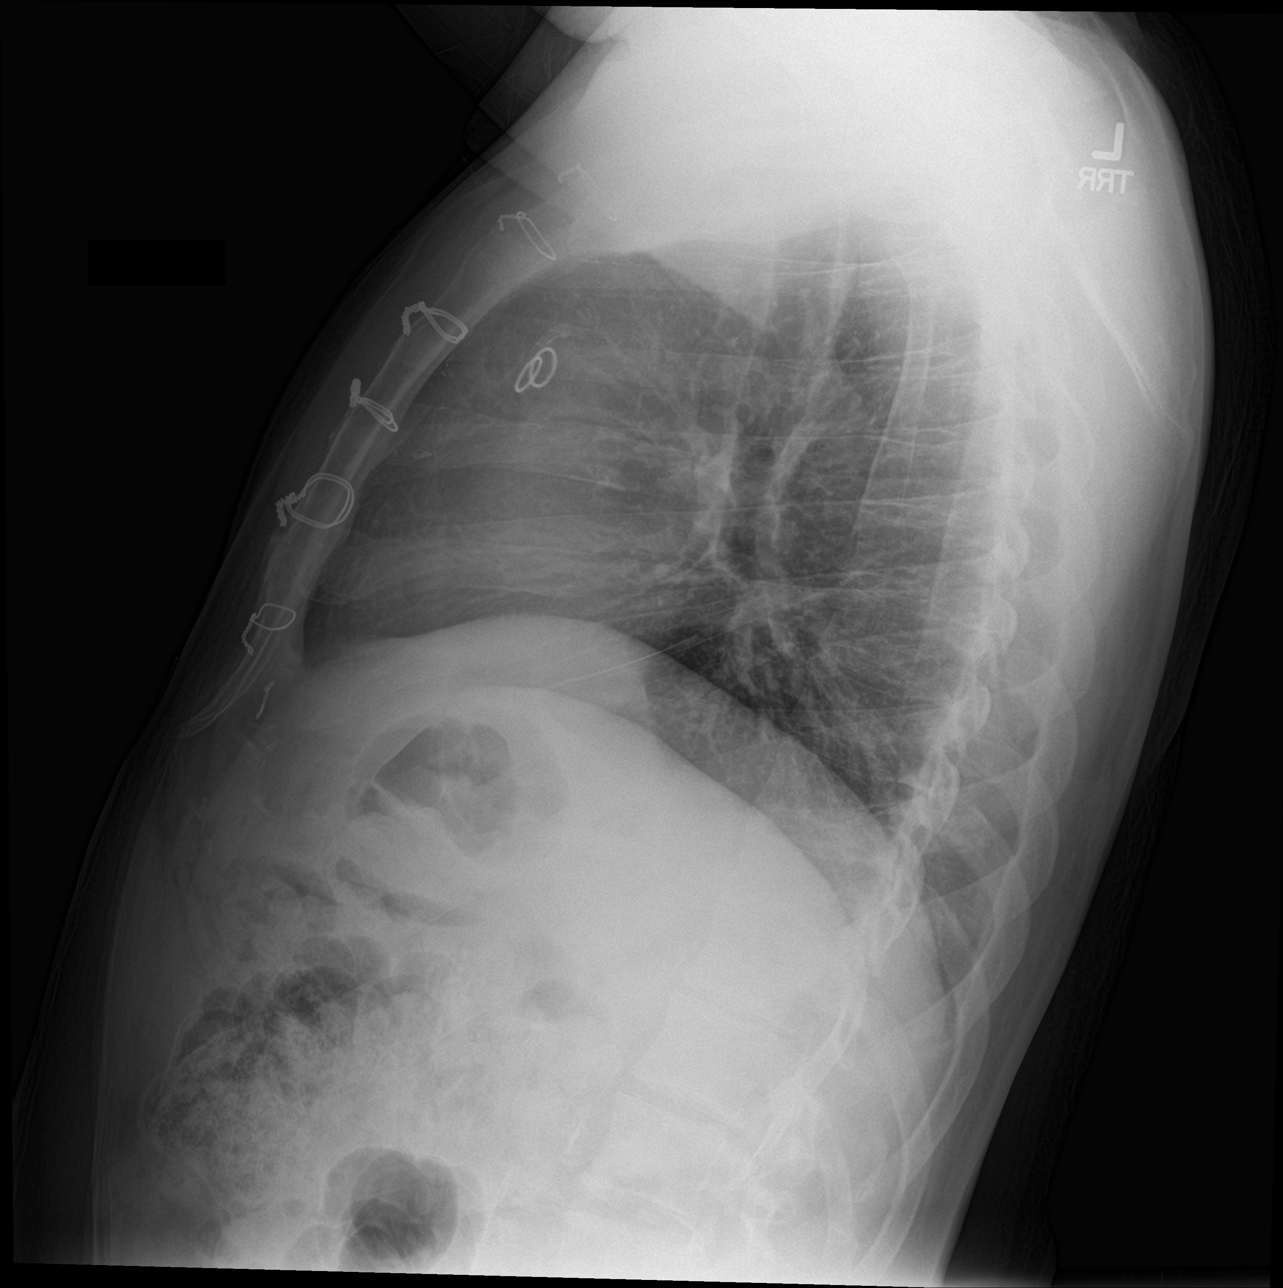

[3 of 3 positions shown; findings below may reference images not displayed]

FINDINGS: The lungs are clear. Pulmonary vasculature is normal. Prior
sternotomy and CABG. Hilar, mediastinal and cardiac contours are
unchanged. No pleural effusions.
IMPRESSION: No active cardiopulmonary disease.
